# Patient Record
Sex: Male | Born: 1950 | Race: White | Hispanic: No | Marital: Married | Smoking: Current every day smoker
Health system: Southern US, Community
[De-identification: ages and names within clinical notes are randomized; demographics above are authoritative.]

## PROBLEM LIST (undated history)

## (undated) DIAGNOSIS — Z8489 Family history of other specified conditions: Secondary | ICD-10-CM

## (undated) DIAGNOSIS — T4145XA Adverse effect of unspecified anesthetic, initial encounter: Secondary | ICD-10-CM

## (undated) DIAGNOSIS — I714 Abdominal aortic aneurysm, without rupture, unspecified: Secondary | ICD-10-CM

## (undated) DIAGNOSIS — K279 Peptic ulcer, site unspecified, unspecified as acute or chronic, without hemorrhage or perforation: Secondary | ICD-10-CM

## (undated) DIAGNOSIS — J45909 Unspecified asthma, uncomplicated: Secondary | ICD-10-CM

## (undated) DIAGNOSIS — R112 Nausea with vomiting, unspecified: Secondary | ICD-10-CM

## (undated) DIAGNOSIS — T8859XA Other complications of anesthesia, initial encounter: Secondary | ICD-10-CM

## (undated) DIAGNOSIS — Z9889 Other specified postprocedural states: Secondary | ICD-10-CM

## (undated) HISTORY — DX: Abdominal aortic aneurysm, without rupture, unspecified: I71.40

## (undated) HISTORY — DX: Abdominal aortic aneurysm, without rupture: I71.4

## (undated) HISTORY — DX: Peptic ulcer, site unspecified, unspecified as acute or chronic, without hemorrhage or perforation: K27.9

---
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
## 1991-12-08 HISTORY — PX: REPAIR OF PERFORATED ULCER: SHX6065

## 1992-05-21 DIAGNOSIS — K265 Chronic or unspecified duodenal ulcer with perforation: Secondary | ICD-10-CM | POA: Insufficient documentation

## 1992-05-21 HISTORY — DX: Chronic or unspecified duodenal ulcer with perforation: K26.5

## 2014-04-23 NOTE — H&P (Signed)
  Timothy Shaffer is an 63 y.o. male.   Chief Complaint: c/o cystic masses left ring and long finger DIP joints HPI: Patient is a 63 y/o male who presents with  A 2 year history of cystic masses of the left long and ring finger DIP joints. No history of trauma to either digit. Minimal pain. He has had no medical treatment to these. He presents now for evaluation and treatment.  No past medical history on file.  No past surgical history on file.  No family history on file. Social History:  has no tobacco, alcohol, and drug history on file.  Allergies: Allergies not on file  No prescriptions prior to admission    No results found for this or any previous visit (from the past 48 hour(s)).  No results found.   Pertinent items are noted in HPI.  There were no vitals taken for this visit.  General appearance: alert Head: Normocephalic, without obvious abnormality Neck: supple, symmetrical, trachea midline Resp: clear to auscultation bilaterally Cardio: regular rate and rhythm GI: normal findings: bowel sounds normal Extremities: Examination of his hands reveals mucoid cysts of both the long and ring finger DIP joints. Neurovascularly he is intact. He has FROM of all the digits. Xray exam reveals narrowing of the joint space with squaring of the condyles of both digits Pulses: 2+ and symmetric Skin: normal Neurologic: Grossly normal    Assessment/Plan Impression: Left long and ring finger mucoid cysts of DIP joints.  Plan: To the OR for excision cysts with DIP joint debridement left long and ring fingers.The procedure, risks,benefits and post-op course were discussed with the patient at length and they were in agreement with the plan.  Marily Lente Dasnoit 04/23/2014, 5:03 PM  H&P documentation: 04/24/2014  -History and Physical Reviewed  -Patient has been re-examined  -No change in the plan of care  Cammie Sickle, MD

## 2014-04-24 ENCOUNTER — Other Ambulatory Visit: Payer: Self-pay | Admitting: Orthopedic Surgery

## 2014-04-24 ENCOUNTER — Ambulatory Visit (HOSPITAL_BASED_OUTPATIENT_CLINIC_OR_DEPARTMENT_OTHER)
Admission: RE | Admit: 2014-04-24 | Discharge: 2014-04-24 | Disposition: A | Discharge status: Home / Self Care | Payer: 59 | Source: Ambulatory Visit | Attending: Orthopedic Surgery | Admitting: Orthopedic Surgery

## 2014-04-24 ENCOUNTER — Encounter (HOSPITAL_BASED_OUTPATIENT_CLINIC_OR_DEPARTMENT_OTHER)
Admission: RE | Disposition: A | Discharge status: Home / Self Care | Payer: Self-pay | Source: Ambulatory Visit | Attending: Orthopedic Surgery

## 2014-04-24 DIAGNOSIS — D219 Benign neoplasm of connective and other soft tissue, unspecified: Secondary | ICD-10-CM | POA: Insufficient documentation

## 2014-04-24 DIAGNOSIS — L723 Sebaceous cyst: Secondary | ICD-10-CM | POA: Insufficient documentation

## 2014-04-24 DIAGNOSIS — M24049 Loose body in unspecified finger joint(s): Secondary | ICD-10-CM | POA: Insufficient documentation

## 2014-04-24 DIAGNOSIS — M19049 Primary osteoarthritis, unspecified hand: Secondary | ICD-10-CM | POA: Insufficient documentation

## 2014-04-24 HISTORY — PX: MASS EXCISION: SHX2000

## 2014-04-24 SURGERY — MINOR EXCISION OF MASS
Anesthesia: LOCAL | Site: Finger | Laterality: Left

## 2014-04-24 MED ORDER — ACETAMINOPHEN-CODEINE #3 300-30 MG PO TABS: 1.0000 | ORAL_TABLET | ORAL | Status: DC | PRN

## 2014-04-24 MED ORDER — CEPHALEXIN 500 MG PO CAPS: 500.0000 mg | ORAL_CAPSULE | Freq: Three times a day (TID) | ORAL | Status: DC

## 2014-04-24 SURGICAL SUPPLY — 39 items
BANDAGE ADH SHEER 1  50/CT (GAUZE/BANDAGES/DRESSINGS) IMPLANT
BANDAGE COBAN STERILE 2 (GAUZE/BANDAGES/DRESSINGS) IMPLANT
BLADE 15 SAFETY STRL DISP (BLADE) ×2 IMPLANT
BNDG COHESIVE 1X5 TAN STRL LF (GAUZE/BANDAGES/DRESSINGS) IMPLANT
BNDG ELASTIC 2 VLCR STRL LF (GAUZE/BANDAGES/DRESSINGS) IMPLANT
BNDG ESMARK 4X9 LF (GAUZE/BANDAGES/DRESSINGS) ×2 IMPLANT
BRUSH SCRUB EZ PLAIN DRY (MISCELLANEOUS) ×2 IMPLANT
CORDS BIPOLAR (ELECTRODE) ×2 IMPLANT
COVER MAYO STAND STRL (DRAPES) ×2 IMPLANT
CUFF TOURNIQUET SINGLE 18IN (TOURNIQUET CUFF) IMPLANT
DECANTER SPIKE VIAL GLASS SM (MISCELLANEOUS) IMPLANT
DRAIN PENROSE 1/2X12 LTX STRL (WOUND CARE) IMPLANT
DRAPE SURG 17X23 STRL (DRAPES) ×2 IMPLANT
GAUZE SPONGE 4X4 12PLY STRL (GAUZE/BANDAGES/DRESSINGS) ×2 IMPLANT
GAUZE XEROFORM 1X8 LF (GAUZE/BANDAGES/DRESSINGS) IMPLANT
GLOVE BIO SURGEON STRL SZ 6.5 (GLOVE) ×2 IMPLANT
GLOVE BIOGEL M STRL SZ7.5 (GLOVE) ×2 IMPLANT
GLOVE BIOGEL PI IND STRL 7.0 (GLOVE) ×2 IMPLANT
GLOVE BIOGEL PI INDICATOR 7.0 (GLOVE) ×2
GLOVE ORTHO TXT STRL SZ7.5 (GLOVE) ×2 IMPLANT
GOWN STRL REUS W/ TWL LRG LVL3 (GOWN DISPOSABLE) ×1 IMPLANT
GOWN STRL REUS W/ TWL XL LVL3 (GOWN DISPOSABLE) ×2 IMPLANT
GOWN STRL REUS W/TWL LRG LVL3 (GOWN DISPOSABLE) ×1
GOWN STRL REUS W/TWL XL LVL3 (GOWN DISPOSABLE) ×2
NEEDLE 27GAX1X1/2 (NEEDLE) ×2 IMPLANT
PACK BASIN DAY SURGERY FS (CUSTOM PROCEDURE TRAY) ×2 IMPLANT
PADDING CAST ABS 4INX4YD NS (CAST SUPPLIES) ×1
PADDING CAST ABS COTTON 4X4 ST (CAST SUPPLIES) ×1 IMPLANT
SPONGE GAUZE 4X4 12PLY STER LF (GAUZE/BANDAGES/DRESSINGS) ×4 IMPLANT
STOCKINETTE 4X48 STRL (DRAPES) ×2 IMPLANT
STRIP CLOSURE SKIN 1/2X4 (GAUZE/BANDAGES/DRESSINGS) IMPLANT
SUT ETHILON 5 0 P 3 18 (SUTURE) ×1
SUT NYLON ETHILON 5-0 P-3 1X18 (SUTURE) ×1 IMPLANT
SUT PROLENE 4 0 P 3 18 (SUTURE) IMPLANT
SYR 3ML 23GX1 SAFETY (SYRINGE) IMPLANT
SYR CONTROL 10ML LL (SYRINGE) ×2 IMPLANT
TOWEL OR 17X24 6PK STRL BLUE (TOWEL DISPOSABLE) ×2 IMPLANT
TRAY DSU PREP LF (CUSTOM PROCEDURE TRAY) ×2 IMPLANT
UNDERPAD 30X30 INCONTINENT (UNDERPADS AND DIAPERS) ×2 IMPLANT

## 2014-04-24 NOTE — Discharge Instructions (Signed)

## 2014-04-24 NOTE — Brief Op Note (Signed)
04/24/2014  2:36 PM  PATIENT:  Timothy Shaffer  63 y.o. male  PRE-OPERATIVE DIAGNOSIS:  mucoid cyst left long ring fingers nail grooving  POST-OPERATIVE DIAGNOSIS:  mucoid cyst left long ring fingers nail grooving  PROCEDURE:  Procedure(s): LEFT LONG & RING FINGERS DISTAL INTERPHALANGEAL JOINT/CYST EXCISION (Left)  SURGEON:  Surgeon(s) and Role:    * Cammie Sickle., MD - Primary  PHYSICIAN ASSISTANT:   ASSISTANTS: Kathyrn Sheriff.A-C   ANESTHESIA:   local  EBL:     BLOOD ADMINISTERED:none  DRAINS: none   LOCAL MEDICATIONS USED:  LIDOCAINE   SPECIMEN:  No Specimen  DISPOSITION OF SPECIMEN:  N/A  COUNTS:  YES  TOURNIQUET:    DICTATION: .Other Dictation: Dictation Number [id number]  PLAN OF CARE: Discharge to home after PACU  PATIENT DISPOSITION:  PACU - hemodynamically stable.   Delay start of Pharmacological VTE agent (>24hrs) due to surgical blood loss or risk of bleeding: not applicable

## 2014-04-24 NOTE — Op Note (Signed)
[id number] 

## 2014-04-25 NOTE — Op Note (Signed)
Timothy Shaffer, Timothy Shaffer            ACCOUNT NO.:  [id number]  MEDICAL RECORD NO.:  [id number]  LOCATION:                                 FACILITY:  PHYSICIAN:  Youlanda Mighty. Legaci Tarman, M.D.      DATE OF BIRTH:  DATE OF PROCEDURE:  04/24/2014 DATE OF DISCHARGE:                              OPERATIVE REPORT   PREOPERATIVE DIAGNOSES: 1. Large mucoid cyst, dorsal aspect of left long finger with full     length nail groove. 2. Mucoid cyst, dorsal radial aspect of left ring finger without nail     groove.  Both are associated with background osteoarthritis of     distal interphalangeal joints.  OPERATION: 1. Excision of mucoid cyst, dorsal aspect of left long finger. 2. Distal interphalangeal joint debridement with marginal osteophyte     resection, synovectomy, irrigation and removal of loose bodies,     left long finger. 3. Removal of mucoid cyst, dorsal aspect of left ring finger. 4. Distal interphalangeal joint debridement with removal of loose     bodies and synovectomy, left ring finger.  OPERATING SURGEON:  Youlanda Mighty. Giovani Neumeister, M.D.  ASSISTANT:  Marily Lente. Dasnoit, PA-C  ANESTHESIA:  2% lidocaine metacarpal head level blocks of left long and ring fingers, total volume of 2% lidocaine with 7 mL.  This was performed as a minor operating room procedure under straight local.  INDICATIONS:  Timothy Shaffer is a 63 year old gentleman who is referred for evaluation and management of nail deformity and large mucoid cyst on the dorsal aspect of his left long and ring fingers. These have been present for 1 year.  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  X-rays of his fingers revealed marginal osteophytes, joint space narrowing.  No obvious loose bodies.  We advised him to consider DIP joint debridement, synovectomy, irrigation and removal of the mucoid cyst.  Typically, nail fold decompression will allow correction of the nail grooving.  Timothy Shaffer was  carefully advised that we could not alter the natural history of his osteoarthritis with the surgery.  We can, however, allow his nails to correct by decompressing the mucoid cysts.  We can also relieve the pain from the mucoid cyst by excision.  He understands he could develop future mucoid cyst, and clearly will develop progression of his osteoarthritis over time.  Detailed informed consent was provided in the office and in the holding area.  DESCRIPTION OF PROCEDURE:  Timothy Shaffer was identified in the holding area.  His proper surgical site of the left long and ring fingers identified per protocol with a marking pen.  He was then transferred to room 2 of the [facility], where he laid upon the operating table in supine position.  His left arm was placed on a hand table and after alcohol Betadine prep, 2% lidocaine was infiltrated in the metacarpal head level to obtain a digital block of the left long and ring fingers.  After 5 minutes, excellent anesthesia of both digits was achieved.  The left hand and arm were then prepped with Betadine soap and solution, sterilely draped.  A pneumatic tourniquet was applied for p.r.n. use in the proximal brachium.  However, plan was to exsanguinate the fingers and use as marked strips at the base of the proximal phalanges as digital tourniquet.  After routine surgical time-out, we exsanguinated the left long finger with a gauze wrap and placed a 3/4 inch wide strip of Esmarch over the proximal phalangeal segment as digital tourniquet.  Procedure commenced with curvilinear incision incorporating the cyst distally.  A very large nearly 1 cubic cc size mucoid cyst was identified and circumferentially dissected.  This was emptied of its contents and its neck excised with a small rongeur.  We then performed a dorsal radial arthrotomy of DIP joint by resecting the capsule between the terminal extensor tendon slip and the  radial collateral ligament.  There are marginal osteophytes at base of the distal phalanx, particularly deep to the radial collateral ligament. These were excised with a micro rongeur and a hose micro curette.  We then used a blunt dental needle to irrigate the joint copiously with sterile saline.  After inspecting for other pathologies none were identified.  The wound was repaired with intradermal 4-0 Prolene.  Xeroflo was applied, followed by a sterile gauze and a Coban finger dressing.  The tourniquet was released from the long finger, followed by exsanguination of the ring finger with a similar gauze wrap and placement of 3 quarter-inch wide strip of Esmarch over the proximal phalangeal segment as a digital tourniquet.  Procedure commenced with a curvilinear incision incorporating the cyst distally.  Subcutaneous tissues were carefully divided taking care to allow circumferential dissection of the cyst.  This was excised with a rongeur followed by arthrotomy in the dorsal radial aspect of the DIP joint.  The capsules were excised between the terminal extensor tendon slip and the radial collateral ligament.  Marginal osteophytes at the base of the distal phalanx and along the dorsum of the middle phalanx were excised with a micro rongeur followed by synovectomy with removal of loose bodies and irrigation with the blunt dental needle.  Hemostasis was achieved by direct compression.  The wound was then repaired with intradermal 4-0 Prolene suture and application of Xeroflo. Sterile gauze applied followed by a Coban finger dressing. The dressings were completed with eye tape.  There were otherwise no apparent complications.  Timothy Shaffer is provided a prescription for Tylenol with Codeine #3, 1 tablet p.o. q.4-6 hours p.r.n. pain.  Also, 30 tablets without refill, also Keflex 500 mg 1 p.o. q.8 hours x4 days a prophylactic antibiotic.     Youlanda Mighty Kile Kabler, M.D.     RVS/MEDQ   D:  04/24/2014  T:  04/25/2014  Job:  [id number]

## 2014-04-26 ENCOUNTER — Encounter (HOSPITAL_BASED_OUTPATIENT_CLINIC_OR_DEPARTMENT_OTHER): Payer: Self-pay | Admitting: Orthopedic Surgery

## 2020-01-24 DIAGNOSIS — Z1211 Encounter for screening for malignant neoplasm of colon: Secondary | ICD-10-CM | POA: Diagnosis not present

## 2020-01-24 DIAGNOSIS — Z720 Tobacco use: Secondary | ICD-10-CM | POA: Diagnosis not present

## 2020-01-24 DIAGNOSIS — Z125 Encounter for screening for malignant neoplasm of prostate: Secondary | ICD-10-CM | POA: Diagnosis not present

## 2020-01-24 DIAGNOSIS — Z136 Encounter for screening for cardiovascular disorders: Secondary | ICD-10-CM | POA: Diagnosis not present

## 2020-01-24 DIAGNOSIS — Z0000 Encounter for general adult medical examination without abnormal findings: Secondary | ICD-10-CM | POA: Diagnosis not present

## 2020-01-24 DIAGNOSIS — R718 Other abnormality of red blood cells: Secondary | ICD-10-CM | POA: Diagnosis not present

## 2020-03-04 DIAGNOSIS — Z1211 Encounter for screening for malignant neoplasm of colon: Secondary | ICD-10-CM | POA: Diagnosis not present

## 2020-03-29 DIAGNOSIS — R195 Other fecal abnormalities: Secondary | ICD-10-CM | POA: Diagnosis not present

## 2020-04-06 HISTORY — PX: COLONOSCOPY: SHX174

## 2020-04-08 DIAGNOSIS — Z0100 Encounter for examination of eyes and vision without abnormal findings: Secondary | ICD-10-CM | POA: Diagnosis not present

## 2020-04-08 DIAGNOSIS — H524 Presbyopia: Secondary | ICD-10-CM | POA: Diagnosis not present

## 2020-04-22 DIAGNOSIS — Z1159 Encounter for screening for other viral diseases: Secondary | ICD-10-CM | POA: Diagnosis not present

## 2020-04-25 DIAGNOSIS — D123 Benign neoplasm of transverse colon: Secondary | ICD-10-CM | POA: Diagnosis not present

## 2020-04-25 DIAGNOSIS — K640 First degree hemorrhoids: Secondary | ICD-10-CM | POA: Diagnosis not present

## 2020-04-25 DIAGNOSIS — D128 Benign neoplasm of rectum: Secondary | ICD-10-CM | POA: Diagnosis not present

## 2020-04-25 DIAGNOSIS — D127 Benign neoplasm of rectosigmoid junction: Secondary | ICD-10-CM | POA: Diagnosis not present

## 2020-04-25 DIAGNOSIS — D124 Benign neoplasm of descending colon: Secondary | ICD-10-CM | POA: Diagnosis not present

## 2020-04-25 DIAGNOSIS — R195 Other fecal abnormalities: Secondary | ICD-10-CM | POA: Diagnosis not present

## 2020-04-25 DIAGNOSIS — D125 Benign neoplasm of sigmoid colon: Secondary | ICD-10-CM | POA: Diagnosis not present

## 2020-04-30 DIAGNOSIS — D128 Benign neoplasm of rectum: Secondary | ICD-10-CM | POA: Diagnosis not present

## 2020-04-30 DIAGNOSIS — D123 Benign neoplasm of transverse colon: Secondary | ICD-10-CM | POA: Diagnosis not present

## 2020-04-30 DIAGNOSIS — D124 Benign neoplasm of descending colon: Secondary | ICD-10-CM | POA: Diagnosis not present

## 2020-04-30 DIAGNOSIS — D125 Benign neoplasm of sigmoid colon: Secondary | ICD-10-CM | POA: Diagnosis not present

## 2020-04-30 DIAGNOSIS — D127 Benign neoplasm of rectosigmoid junction: Secondary | ICD-10-CM | POA: Diagnosis not present

## 2020-05-21 ENCOUNTER — Encounter: Payer: Self-pay | Admitting: Surgery

## 2020-05-21 ENCOUNTER — Ambulatory Visit: Payer: Self-pay | Admitting: Surgery

## 2020-05-21 DIAGNOSIS — K265 Chronic or unspecified duodenal ulcer with perforation: Secondary | ICD-10-CM

## 2020-05-21 DIAGNOSIS — D120 Benign neoplasm of cecum: Secondary | ICD-10-CM | POA: Insufficient documentation

## 2020-05-21 DIAGNOSIS — Z8719 Personal history of other diseases of the digestive system: Secondary | ICD-10-CM | POA: Diagnosis not present

## 2020-05-21 DIAGNOSIS — Z720 Tobacco use: Secondary | ICD-10-CM

## 2020-05-21 DIAGNOSIS — K279 Peptic ulcer, site unspecified, unspecified as acute or chronic, without hemorrhage or perforation: Secondary | ICD-10-CM

## 2020-05-21 DIAGNOSIS — Z789 Other specified health status: Secondary | ICD-10-CM

## 2020-05-21 DIAGNOSIS — Z86010 Personal history of colonic polyps: Secondary | ICD-10-CM | POA: Insufficient documentation

## 2020-05-21 DIAGNOSIS — Z860101 Personal history of adenomatous and serrated colon polyps: Secondary | ICD-10-CM

## 2020-05-21 DIAGNOSIS — K635 Polyp of colon: Secondary | ICD-10-CM | POA: Diagnosis not present

## 2020-05-21 NOTE — H&P (Signed)
Toniann Ket Appointment: 05/21/2020 10:00 AM Location: [place] Office Patient #: [id number] DOB: July 18, 1951 Married / Language: Cleophus Molt / Race: White Male  History of Present Illness Adin Hector MD; 05/21/2020 10:56 AM) The patient is a 69 year old male who presents with a colonic polyp. Note for "Colonic polyp": ` ` ` Patient sent for surgical consultation at the request of Dr Michail Sermon  Chief Complaint: large cecal polypoid mass not amenable to endoscopic resection. ` ` The patient is a active smoking gentleman who is followed by Dr. Sheryn Bison. Had declined colonoscopy offerred in 2009 age 50 in the past. Had a positive colon Fit test. Sent for endoscopy. Numerous colon polyps found. Small ones and rectosigmoid region removed. Large bulky mass in cecum biopsied. Felt not to be amenable to endoscopic resection. Pathology consistent with adenomatous polyp. Surgical consultation recommended by Dr. Michail Sermon to consider segmental resection.  Patient comes in today with his wife. He is a moderate smoker. Sounds like he is a moderate drinker as well. Seems like he tends to avoid doctors. He did have emergency surgery for what sounds like a perforated duodenal ulcer in the 1990s when they lived up in [place] at [facility]. He is not on any antacid medication. He takes ibuprofen sparingly. Moves his bowels twice a day. No hematochezia or melena. He claims he can walk a half hour without difficulty. No history of heart attack or stroke. No diabetes. He is not on any proton pump inhibitors.  No personal nor family history of GI/colon cancer, inflammatory bowel disease, irritable bowel syndrome, allergy such as Celiac Sprue, dietary/dairy problems, colitis, ulcers nor gastritis. No recent sick contacts/gastroenteritis. No travel outside the country. No changes in diet. No dysphagia to solids or liquids. No significant heartburn or reflux. No melena, hematemesis, coffee ground  emesis. No evidence of prior gastric/peptic ulceration.  (Review of systems as stated in this history (HPI) or in the review of systems. Otherwise all other 12 point ROS are negative) ` ` `  This patient encounter took 35 minutes today to perform the following: obtain history, perform exam, review outside records, interpret tests & imaging, counsel the patient on their diagnosis; and, document this encounter, including findings & plan in the electronic health record (EHR).   Past Surgical History Illene Regulus, CMA; 05/21/2020 10:17 AM) Resection of Small Bowel Tonsillectomy  Allergies (Alisha Spillers, CMA; 05/21/2020 10:18 AM) No Known Drug Allergies [05/21/2020]:  Medication History (Alisha Spillers, CMA; 05/21/2020 10:18 AM) No Current Medications Medications Reconciled  Social History Lars Mage Spillers, CMA; 05/21/2020 10:17 AM) Alcohol use Moderate alcohol use. Caffeine use Coffee. Illicit drug use Remotely quit drug use. Tobacco use Current every day smoker.  Family History Illene Regulus, CMA; 05/21/2020 10:17 AM) Breast Cancer Daughter.  Other Problems Lars Mage Spillers, CMA; 05/21/2020 10:17 AM) Gastric Ulcer     Review of Systems (Alisha Spillers CMA; 05/21/2020 10:17 AM) General Not Present- Appetite Loss, Chills, Fatigue, Fever, Night Sweats, Weight Gain and Weight Loss. Skin Present- Non-Healing Wounds. Not Present- Change in Wart/Mole, Dryness, Hives, Jaundice, New Lesions, Rash and Ulcer. HEENT Present- Wears glasses/contact lenses. Not Present- Earache, Hearing Loss, Hoarseness, Nose Bleed, Oral Ulcers, Ringing in the Ears, Seasonal Allergies, Sinus Pain, Sore Throat, Visual Disturbances and Yellow Eyes. Respiratory Not Present- Bloody sputum, Chronic Cough, Difficulty Breathing, Snoring and Wheezing. Breast Not Present- Breast Mass, Breast Pain, Nipple Discharge and Skin Changes. Cardiovascular Not Present- Chest Pain, Difficulty Breathing Lying  Down, Leg Cramps, Palpitations, Rapid Heart Rate, Shortness of Breath  and Swelling of Extremities. Gastrointestinal Not Present- Abdominal Pain, Bloating, Bloody Stool, Change in Bowel Habits, Chronic diarrhea, Constipation, Difficulty Swallowing, Excessive gas, Gets full quickly at meals, Hemorrhoids, Indigestion, Nausea, Rectal Pain and Vomiting. Male Genitourinary Not Present- Blood in Urine, Change in Urinary Stream, Frequency, Impotence, Nocturia, Painful Urination, Urgency and Urine Leakage.  Vitals (Alisha Spillers CMA; 05/21/2020 10:18 AM) 05/21/2020 10:17 AM Weight: 126 lb Height: 71in Body Surface Area: 1.73 m Body Mass Index: 17.57 kg/m  Temp.: 98.8F(Oral)  Pulse: 94 (Regular)  BP: 142/82(Sitting, Left Arm, Standard)        Physical Exam Adin Hector MD; 05/21/2020 10:55 AM)  General Mental Status-Alert. General Appearance-Not in acute distress, Not Sickly. Orientation-Oriented X3. Hydration-Well hydrated. Voice-Normal. Note: thin male. Not quite cachectic. Moves slightly slowly but steadily.  Integumentary Global Assessment Upon inspection and palpation of skin surfaces of the - Axillae: non-tender, no inflammation or ulceration, no drainage. and Distribution of scalp and body hair is normal. General Characteristics Temperature - normal warmth is noted.  Head and Neck Head-normocephalic, atraumatic with no lesions or palpable masses. Face Global Assessment - atraumatic, no absence of expression. Neck Global Assessment - no abnormal movements, no bruit auscultated on the right, no bruit auscultated on the left, no decreased range of motion, non-tender. Trachea-midline. Thyroid Gland Characteristics - non-tender.  Eye Eyeball - Left-Extraocular movements intact, No Nystagmus - Left. Eyeball - Right-Extraocular movements intact, No Nystagmus - Right. Cornea - Left-No Hazy - Left. Cornea - Right-No Hazy -  Right. Sclera/Conjunctiva - Left-No scleral icterus, No Discharge - Left. Sclera/Conjunctiva - Right-No scleral icterus, No Discharge - Right. Pupil - Left-Direct reaction to light normal. Pupil - Right-Direct reaction to light normal.  ENMT Ears Pinna - Left - no drainage observed, no generalized tenderness observed. Pinna - Right - no drainage observed, no generalized tenderness observed. Nose and Sinuses External Inspection of the Nose - no destructive lesion observed. Inspection of the nares - Left - quiet respiration. Inspection of the nares - Right - quiet respiration. Mouth and Throat Lips - Upper Lip - no fissures observed, no pallor noted. Lower Lip - no fissures observed, no pallor noted. Nasopharynx - no discharge present. Oral Cavity/Oropharynx - Tongue - no dryness observed. Oral Mucosa - no cyanosis observed. Hypopharynx - no evidence of airway distress observed.  Chest and Lung Exam Inspection Movements - Normal and Symmetrical. Accessory muscles - No use of accessory muscles in breathing. Palpation Palpation of the chest reveals - Non-tender. Auscultation Breath sounds - Normal and Clear.  Cardiovascular Auscultation Rhythm - Regular. Murmurs & Other Heart Sounds - Auscultation of the heart reveals - No Murmurs and No Systolic Clicks.  Abdomen Inspection Inspection of the abdomen reveals - No Visible peristalsis and No Abnormal pulsations. Umbilicus - No Bleeding, No Urine drainage. Palpation/Percussion Palpation and Percussion of the abdomen reveal - Soft, Non Tender, No Rebound tenderness, No Rigidity (guarding) and No Cutaneous hyperesthesia. Note: Abdomen Flat and somewhat firm at first but then softens up. Upper midline incision. Very thin abdominal wall. Can feel some stitches and the subcutaneous tissues tissue. No obvious hernia. Abdomen is nontender. Not distended. No umbilical or incisional hernias. No guarding.  Male Genitourinary Sexual  Maturity Tanner 5 - Adult hair pattern and Adult penile size and shape. Note: No inguinal hernias. Normal external genitalia. Epididymi, testes, and spermatic cords normal without any masses.  Rectal Note: deferred given recent colonoscopy. Apparently mild hemorrhoids noted.  Peripheral Vascular Upper Extremity Inspection - Left -  No Cyanotic nailbeds - Left, Not Ischemic. Inspection - Right - No Cyanotic nailbeds - Right, Not Ischemic.  Neurologic Neurologic evaluation reveals -normal attention span and ability to concentrate, able to name objects and repeat phrases. Appropriate fund of knowledge , normal sensation and normal coordination. Mental Status Affect - not angry, not paranoid. Cranial Nerves-Normal Bilaterally. Gait-Normal.  Neuropsychiatric Mental status exam performed with findings of-able to articulate well with normal speech/language, rate, volume and coherence, thought content normal with ability to perform basic computations and apply abstract reasoning and no evidence of hallucinations, delusions, obsessions or homicidal/suicidal ideation.  Musculoskeletal Global Assessment Spine, Ribs and Pelvis - no instability, subluxation or laxity. Right Upper Extremity - no instability, subluxation or laxity.  Lymphatic Head & Neck  General Head & Neck Lymphatics: Bilateral - Description - No Localized lymphadenopathy. Axillary  General Axillary Region: Bilateral - Description - No Localized lymphadenopathy. Femoral & Inguinal  Generalized Femoral & Inguinal Lymphatics: Left - Description - No Localized lymphadenopathy. Right - Description - No Localized lymphadenopathy.    Assessment & Plan Adin Hector MD; 05/21/2020 10:49 AM)  POLYP OF CECUM (K63.5) Impression: large bulky polyp of cecum not amenable to endoscopic resection. Adenomatous.  He would benefit from segmental resection. Proximal right ileocolectomy. Intracorporeal anastomosis. Reasonable  for robotic approach. He has had prior major upper abdominal surgery for perforated ulcer decades ago. No other surgeries. That may increase OR time with lysis of adhesions but hopefully likelihood of conversion is low.  He is interested in proceeding. His wife agrees.  I strongly recommend he back off on alcohol use and quit smoking to minimize perioperative issues and complications. He will consider it.   PREOP COLON - ENCOUNTER FOR PREOPERATIVE EXAMINATION FOR GENERAL SURGICAL PROCEDURE (Z01.818)  Current Plans You are being scheduled for surgery- Our schedulers will call you.  You should hear from our office's scheduling department within 5 working days about the location, date, and time of surgery. We try to make accommodations for patient's preferences in scheduling surgery, but sometimes the OR schedule or the surgeon's schedule prevents [place] from making those accommodations.  If you have not heard from our office [phone]) in 5 working days, call the office and ask for your surgeon's nurse.  If you have other questions about your diagnosis, plan, or surgery, call the office and ask for your surgeon's nurse.  Written instructions provided The anatomy & physiology of the digestive tract was discussed. The pathophysiology of the colon was discussed. Natural history risks without surgery was discussed. I feel the risks of no intervention will lead to serious problems that outweigh the operative risks; therefore, I recommended a partial colectomy to remove the pathology. Minimally invasive (Robotic/Laparoscopic) & open techniques were discussed.  Risks such as bleeding, infection, abscess, leak, reoperation, possible ostomy, hernia, heart attack, death, and other risks were discussed. I noted a good likelihood this will help address the problem. Goals of post-operative recovery were discussed as well. Need for adequate nutrition, daily bowel regimen and healthy physical  activity, to optimize recovery was noted as well. We will work to minimize complications. Educational materials were available as well. Questions were answered. The patient expresses understanding & wishes to proceed with surgery.  Pt Education - [facility] Colon Bowel Prep 2018 ERAS/Miralax/Antibiotics Started Neomycin Sulfate 500 MG Oral Tablet, 2 (two) Tablet SEE NOTE, #6, 05/21/2020, No Refill. Local Order: Pharmacist Notes: TAKE TWO TABLETS AT 2 PM, 3 PM, AND 10 PM THE DAY PRIOR TO SURGERY Started  Flagyl 500 MG Oral Tablet, 2 (two) Tablet SEE NOTE, #6, 05/21/2020, No Refill. Local Order: Pharmacist Notes: Take at 2pm, 3pm, and 10pm the day prior to your colon operation Pt Education - Pamphlet Given - Laparoscopic Colorectal Surgery: discussed with patient and provided information. Pt Education - [facility] Colectomy post-op instructions: discussed with patient and provided information.  TOBACCO ABUSE (Z72.0)  STOP SMOKING! We talked to the patient about the dangers of smoking.  We stressed that tobacco use dramatically increases the risk of peri-operative complications such as infection, tissue necrosis leaving to problems with incision/wound and organ healing, hernia, chronic pain, heart attack, stroke, DVT, pulmonary embolism, and death.  We noted there are programs in our community to help stop smoking.  Information was available.   Current Plans Pt Education - [facility] STOP SMOKING!  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 [address], [place], [place] [address] [phone] Fax [phone] Main/Paging  CONTACT INFORMATION: Weekday (9AM-5PM) concerns: Call [facility] main office at [phone] Weeknight (5PM-9AM) or Weekend/Holiday concerns: Check [email or web address] for General Surgery [facility] coverage (Please, do not use SecureChat as it  is not reliable communication to operating surgeons for immediate patient care)

## 2020-07-05 NOTE — Patient Instructions (Addendum)
DUE TO COVID-19 ONLY ONE VISITOR IS ALLOWED TO COME WITH YOU AND STAY IN THE WAITING ROOM ONLY DURING PRE OP AND PROCEDURE DAY OF SURGERY. THE 2 VISITORS MAY VISIT WITH YOU AFTER SURGERY IN YOUR PRIVATE ROOM DURING VISITING HOURS ONLY!  YOU NEED TO HAVE A COVID 19 TEST ON__8/7_____ @_10 :00______, THIS TEST MUST BE DONE BEFORE SURGERY, COVID TESTING SITE [address] [place] [address], IT IS ON THE RIGHT GOING OUT [address] APPROXITAMELTELY 2 MINUTES PAST [employer] ON THE RIGHT. ONCE YOUR COVID TEST IS COMPLETED,  PLEASE BEGIN THE QUARANTINE INSTRUCTIONS AS OUTLINED IN YOUR HANDOUT.                Jaziah Goeller    Your procedure is scheduled on: 07/17/20   Report to [facility] Main  Entrance   Report to admitting at  12:00 PM .     Call this number if you have problems the morning of surgery [phone]   Follow bowel prep instructions from Dr. Johney [place]' office.  Drink plenty of fluids the day of prep to prevent dehydration.  Marland Kitchen BRUSH YOUR TEETH MORNING OF SURGERY AND RINSE YOUR MOUTH OUT, NO CHEWING GUM CANDY OR MINTS.  DRINK 2 PRESURGERY ENSURE DRINKS THE NIGHT BEFORE SURGERY AT 10:00 PM    NO SOLIDS AFTER MIDNIGHT THE DAY PRIOR TO THE SURGERY.   NOTHING BY MOUTH EXCEPT CLEAR LIQUIDS UNTIL 11:00 AM  . PLEASE FINISH PRESURGERY ENSURE DRINK at 11:00 AM     Take these medicines the morning of surgery with A SIP OF WATER: Use your inhaler and bring it with you to the hospital                                 You may not have any metal on your body including              piercings  Do not wear jewelry, lotions, powders or deodorant                      Men may shave face and neck.   Do not bring valuables to the hospital. [place].  Contacts, dentures or bridgework may not be worn into surgery.                [place] - Preparing for Surgery  Before surgery, you can play an  important role.   Because skin is not sterile, your skin needs to be as free of germs as possible.   You can reduce the number of germs on your skin by washing with CHG (chlorahexidine gluconate) soap before surgery.   CHG is an antiseptic cleaner which kills germs and bonds with the skin to continue killing germs even after washing. Please DO NOT use if you have an allergy to CHG or antibacterial soaps.   If your skin becomes reddened/irritated stop using the CHG and inform your nurse when you arrive at Short Stay. .  You may shave your face/neck.  Please follow these instructions carefully:  1.  Shower with CHG Soap the night before surgery and the  morning of Surgery.  2.  If you choose to wash your hair, wash your hair first as usual with your  normal  shampoo.  3.  After you shampoo, rinse your hair and body thoroughly to remove the  shampoo.                                        4.  Use CHG as you would any other liquid soap.  You can apply chg directly  to the skin and wash                       Gently with a scrungie or clean washcloth.  5.  Apply the CHG Soap to your body ONLY FROM THE NECK DOWN.   Do not use on face/ open                           Wound or open sores. Avoid contact with eyes, ears mouth and genitals (private parts).                       Wash face,  Genitals (private parts) with your normal soap.             6.  Wash thoroughly, paying special attention to the area where your surgery  will be performed.  7.  Thoroughly rinse your body with warm water from the neck down.  8.  DO NOT shower/wash with your normal soap after using and rinsing off  the CHG Soap.             9.  Pat yourself dry with a clean towel.            10.  Wear clean pajamas.            11.  Place clean sheets on your bed the night of your first shower and do not  sleep with pets. Day of Surgery : Do not apply any lotions/deodorants the morning of surgery.  Please wear clean clothes to the  hospital/surgery center.  FAILURE TO FOLLOW THESE INSTRUCTIONS MAY RESULT IN THE CANCELLATION OF YOUR SURGERY PATIENT SIGNATURE_________________________________  NURSE SIGNATURE__________________________________  ________________________________________________________________________   Adam Phenix  An incentive spirometer is a tool that can help keep your lungs clear and active. This tool measures how well you are filling your lungs with each breath. Taking long deep breaths may help reverse or decrease the chance of developing breathing (pulmonary) problems (especially infection) following:  A long period of time when you are unable to move or be active. BEFORE THE PROCEDURE   If the spirometer includes an indicator to show your best effort, your nurse or respiratory therapist will set it to a desired goal.  If possible, sit up straight or lean slightly forward. Try not to slouch.  Hold the incentive spirometer in an upright position. INSTRUCTIONS FOR USE  1. Sit on the edge of your bed if possible, or sit up as far as you can in bed or on a chair. 2. Hold the incentive spirometer in an upright position. 3. Breathe out normally. 4. Place the mouthpiece in your mouth and seal your lips tightly around it. 5. Breathe in slowly and as deeply as possible, raising the piston or the ball toward the top of the column. 6. Hold your breath for 3-5 seconds or for as long as possible. Allow the piston or ball to fall to the bottom of the column. 7. Remove the mouthpiece from  your mouth and breathe out normally. 8. Rest for a few seconds and repeat Steps 1 through 7 at least 10 times every 1-2 hours when you are awake. Take your time and take a few normal breaths between deep breaths. 9. The spirometer may include an indicator to show your best effort. Use the indicator as a goal to work toward during each repetition. 10. After each set of 10 deep breaths, practice coughing to be sure  your lungs are clear. If you have an incision (the cut made at the time of surgery), support your incision when coughing by placing a pillow or rolled up towels firmly against it. Once you are able to get out of bed, walk around indoors and cough well. You may stop using the incentive spirometer when instructed by your caregiver.  RISKS AND COMPLICATIONS  Take your time so you do not get dizzy or light-headed.  If you are in pain, you may need to take or ask for pain medication before doing incentive spirometry. It is harder to take a deep breath if you are having pain. AFTER USE  Rest and breathe slowly and easily.  It can be helpful to keep track of a log of your progress. Your caregiver can provide you with a simple table to help with this. If you are using the spirometer at home, follow these instructions: [place] IF:   You are having difficultly using the spirometer.  You have trouble using the spirometer as often as instructed.  Your pain medication is not giving enough relief while using the spirometer.  You develop fever of 100.5 F (38.1 C) or higher. SEEK IMMEDIATE MEDICAL CARE IF:   You cough up bloody sputum that had not been present before.  You develop fever of 102 F (38.9 C) or greater.  You develop worsening pain at or near the incision site. MAKE SURE YOU:   Understand these instructions.  Will watch your condition.  Will get help right away if you are not doing well or get worse. Document Released: 04/05/2007 Document Revised: 02/15/2012 Document Reviewed: 06/06/2007 [facility] Patient Information 2014 [place], [place].   ________________________________________________________________________

## 2020-07-08 ENCOUNTER — Other Ambulatory Visit: Payer: Self-pay

## 2020-07-08 ENCOUNTER — Encounter (HOSPITAL_COMMUNITY): Payer: Self-pay

## 2020-07-08 ENCOUNTER — Encounter (HOSPITAL_COMMUNITY)
Admission: RE | Admit: 2020-07-08 | Discharge: 2020-07-08 | Disposition: A | Discharge status: Home / Self Care | Payer: Medicare HMO | Source: Ambulatory Visit | Attending: Surgery | Admitting: Surgery

## 2020-07-08 DIAGNOSIS — Z01812 Encounter for preprocedural laboratory examination: Secondary | ICD-10-CM | POA: Insufficient documentation

## 2020-07-08 HISTORY — DX: Other complications of anesthesia, initial encounter: T88.59XA

## 2020-07-08 HISTORY — DX: Unspecified asthma, uncomplicated: J45.909

## 2020-07-08 HISTORY — DX: Other specified postprocedural states: Z98.890

## 2020-07-08 HISTORY — DX: Family history of other specified conditions: Z84.89

## 2020-07-08 LAB — CBC
HCT: 43.9 % (ref 39.0–52.0)
Hemoglobin: 14.6 g/dL (ref 13.0–17.0)
MCH: 35.9 pg — ABNORMAL HIGH (ref 26.0–34.0)
MCHC: 33.3 g/dL (ref 30.0–36.0)
MCV: 107.9 fL — ABNORMAL HIGH (ref 80.0–100.0)
Platelets: 218 10*3/uL (ref 150–400)
RBC: 4.07 MIL/uL — ABNORMAL LOW (ref 4.22–5.81)
RDW: 12.2 % (ref 11.5–15.5)
WBC: 7.4 10*3/uL (ref 4.0–10.5)
nRBC: 0 % (ref 0.0–0.2)

## 2020-07-08 LAB — HEMOGLOBIN A1C
Hgb A1c MFr Bld: 5 % (ref 4.8–5.6)
Mean Plasma Glucose: 96.8 mg/dL

## 2020-07-08 NOTE — Progress Notes (Signed)
COVID Vaccine Completed:Yes Date COVID Vaccine completed:04/03/20 COVID vaccine manufacturer: Pfizer     PCP - Dr. Charlynne Cousins Cardiologist - no  Chest x-ray - no EKG - no Stress Test - no ECHO - no Cardiac Cath - no  Sleep Study - no CPAP -   Fasting Blood Sugar - NA Checks Blood Sugar _____ times a day  Blood Thinner Instructions:NA Aspirin Instructions: Last Dose:  Anesthesia review:   Patient denies shortness of breath, fever, cough and chest pain at PAT appointment yes   Patient verbalized understanding of instructions that were given to them at the PAT appointment. Patient was also instructed that they will need to review over the PAT instructions again at home before surgery. 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 He has no SOB with ADLs.

## 2020-07-08 NOTE — Consult Note (Signed)
[place] Nurse requested for preoperative stoma site marking  Discussed surgical procedure and stoma creation with patient.  Explained role of the [place] nurse team. Provided the patient with educational booklet and provided samples of pouching options.  Answered patient's questions.   Examined patient sitting, and standing in order to place the marking in the patient's visual field, away from any creases or abdominal contour issues and within the rectus muscle.  Attempted to mark below the patient's belt line. There is a crease of loose skin which occurs lower on the abdomen when the patient sits forward and should be avoided if possible.   Marked for colostomy in the LLQ  _2.5___inches  to the left of the umbilicus and _5__WPVXYI below the umbilicus.  Marked for ileostomy in the RLQ  __2.5_inches to the right of the umbilicus and  __0__ inches below the umbilicus.  Patient's abdomen cleansed with CHG wipes at site markings, allowed to air dry prior to marking.Covered mark with thin film transparent dressing to preserve mark until date of surgery. Provided patient with marking pen and instructed to re-color in the area if it begins to fade before surgery occurs.   [place] Nurse team will follow up with patient after surgery for continued ostomy care and teaching if they receive an ostomy during surgery.  Julien Girt MSN, RN, [place], [place], [place]

## 2020-07-13 ENCOUNTER — Other Ambulatory Visit (HOSPITAL_COMMUNITY)
Admission: RE | Admit: 2020-07-13 | Discharge: 2020-07-13 | Disposition: A | Discharge status: Home / Self Care | Payer: Medicare HMO | Source: Ambulatory Visit | Attending: Surgery | Admitting: Surgery

## 2020-07-13 DIAGNOSIS — Z01812 Encounter for preprocedural laboratory examination: Secondary | ICD-10-CM | POA: Diagnosis not present

## 2020-07-13 DIAGNOSIS — Z20822 Contact with and (suspected) exposure to covid-19: Secondary | ICD-10-CM | POA: Diagnosis not present

## 2020-07-13 LAB — SARS CORONAVIRUS 2 (TAT 6-24 HRS): SARS Coronavirus 2: NEGATIVE

## 2020-07-16 MED ORDER — BUPIVACAINE LIPOSOME 1.3 % IJ SUSP
20.0000 mL | Freq: Once | INTRAMUSCULAR | Status: DC
  Filled 2020-07-16: qty 20

## 2020-07-17 ENCOUNTER — Encounter (HOSPITAL_COMMUNITY): Payer: Self-pay | Admitting: Surgery

## 2020-07-17 ENCOUNTER — Encounter (HOSPITAL_COMMUNITY)
Admission: RE | Disposition: A | Discharge status: Home / Self Care | Payer: Self-pay | Source: Home / Self Care | Attending: Surgery

## 2020-07-17 ENCOUNTER — Inpatient Hospital Stay (HOSPITAL_COMMUNITY): Payer: Medicare HMO | Admitting: Certified Registered"

## 2020-07-17 ENCOUNTER — Inpatient Hospital Stay (HOSPITAL_COMMUNITY)
Admission: RE | Admit: 2020-07-17 | Discharge: 2020-07-24 | DRG: 329 | Disposition: A | Discharge status: Home / Self Care | Payer: Medicare HMO | Attending: Surgery | Admitting: Surgery

## 2020-07-17 DIAGNOSIS — Z886 Allergy status to analgesic agent status: Secondary | ICD-10-CM

## 2020-07-17 DIAGNOSIS — D638 Anemia in other chronic diseases classified elsewhere: Secondary | ICD-10-CM | POA: Diagnosis present

## 2020-07-17 DIAGNOSIS — D120 Benign neoplasm of cecum: Secondary | ICD-10-CM | POA: Diagnosis not present

## 2020-07-17 DIAGNOSIS — E440 Moderate protein-calorie malnutrition: Secondary | ICD-10-CM | POA: Diagnosis present

## 2020-07-17 DIAGNOSIS — J9600 Acute respiratory failure, unspecified whether with hypoxia or hypercapnia: Secondary | ICD-10-CM | POA: Diagnosis not present

## 2020-07-17 DIAGNOSIS — R7981 Abnormal blood-gas level: Secondary | ICD-10-CM

## 2020-07-17 DIAGNOSIS — R918 Other nonspecific abnormal finding of lung field: Secondary | ICD-10-CM | POA: Diagnosis not present

## 2020-07-17 DIAGNOSIS — K567 Ileus, unspecified: Secondary | ICD-10-CM | POA: Diagnosis not present

## 2020-07-17 DIAGNOSIS — R578 Other shock: Secondary | ICD-10-CM | POA: Diagnosis not present

## 2020-07-17 DIAGNOSIS — F17210 Nicotine dependence, cigarettes, uncomplicated: Secondary | ICD-10-CM | POA: Diagnosis present

## 2020-07-17 DIAGNOSIS — M47816 Spondylosis without myelopathy or radiculopathy, lumbar region: Secondary | ICD-10-CM | POA: Diagnosis not present

## 2020-07-17 DIAGNOSIS — N179 Acute kidney failure, unspecified: Secondary | ICD-10-CM | POA: Diagnosis not present

## 2020-07-17 DIAGNOSIS — Z7289 Other problems related to lifestyle: Secondary | ICD-10-CM

## 2020-07-17 DIAGNOSIS — T17890A Other foreign object in other parts of respiratory tract causing asphyxiation, initial encounter: Secondary | ICD-10-CM | POA: Diagnosis not present

## 2020-07-17 DIAGNOSIS — J9811 Atelectasis: Secondary | ICD-10-CM | POA: Diagnosis not present

## 2020-07-17 DIAGNOSIS — K279 Peptic ulcer, site unspecified, unspecified as acute or chronic, without hemorrhage or perforation: Secondary | ICD-10-CM | POA: Diagnosis present

## 2020-07-17 DIAGNOSIS — I700 Atherosclerosis of aorta: Secondary | ICD-10-CM | POA: Diagnosis not present

## 2020-07-17 DIAGNOSIS — K6389 Other specified diseases of intestine: Secondary | ICD-10-CM | POA: Diagnosis present

## 2020-07-17 DIAGNOSIS — Z8711 Personal history of peptic ulcer disease: Secondary | ICD-10-CM | POA: Diagnosis not present

## 2020-07-17 DIAGNOSIS — J930 Spontaneous tension pneumothorax: Secondary | ICD-10-CM | POA: Diagnosis not present

## 2020-07-17 DIAGNOSIS — K635 Polyp of colon: Principal | ICD-10-CM | POA: Diagnosis present

## 2020-07-17 DIAGNOSIS — Z789 Other specified health status: Secondary | ICD-10-CM | POA: Diagnosis present

## 2020-07-17 DIAGNOSIS — Z9689 Presence of other specified functional implants: Secondary | ICD-10-CM

## 2020-07-17 DIAGNOSIS — J189 Pneumonia, unspecified organism: Secondary | ICD-10-CM | POA: Diagnosis not present

## 2020-07-17 DIAGNOSIS — Z681 Body mass index (BMI) 19 or less, adult: Secondary | ICD-10-CM

## 2020-07-17 DIAGNOSIS — R579 Shock, unspecified: Secondary | ICD-10-CM | POA: Diagnosis not present

## 2020-07-17 DIAGNOSIS — Z9049 Acquired absence of other specified parts of digestive tract: Secondary | ICD-10-CM

## 2020-07-17 DIAGNOSIS — J9602 Acute respiratory failure with hypercapnia: Secondary | ICD-10-CM | POA: Diagnosis not present

## 2020-07-17 DIAGNOSIS — J9690 Respiratory failure, unspecified, unspecified whether with hypoxia or hypercapnia: Secondary | ICD-10-CM | POA: Diagnosis not present

## 2020-07-17 DIAGNOSIS — J939 Pneumothorax, unspecified: Secondary | ICD-10-CM

## 2020-07-17 DIAGNOSIS — Z4682 Encounter for fitting and adjustment of non-vascular catheter: Secondary | ICD-10-CM | POA: Diagnosis not present

## 2020-07-17 DIAGNOSIS — Z0189 Encounter for other specified special examinations: Secondary | ICD-10-CM

## 2020-07-17 DIAGNOSIS — D122 Benign neoplasm of ascending colon: Secondary | ICD-10-CM | POA: Diagnosis not present

## 2020-07-17 DIAGNOSIS — R0902 Hypoxemia: Secondary | ICD-10-CM | POA: Diagnosis not present

## 2020-07-17 DIAGNOSIS — G8929 Other chronic pain: Secondary | ICD-10-CM | POA: Diagnosis present

## 2020-07-17 DIAGNOSIS — Z720 Tobacco use: Secondary | ICD-10-CM | POA: Diagnosis present

## 2020-07-17 DIAGNOSIS — K660 Peritoneal adhesions (postprocedural) (postinfection): Secondary | ICD-10-CM | POA: Diagnosis present

## 2020-07-17 DIAGNOSIS — J984 Other disorders of lung: Secondary | ICD-10-CM | POA: Diagnosis not present

## 2020-07-17 DIAGNOSIS — Z452 Encounter for adjustment and management of vascular access device: Secondary | ICD-10-CM | POA: Diagnosis not present

## 2020-07-17 DIAGNOSIS — J440 Chronic obstructive pulmonary disease with acute lower respiratory infection: Secondary | ICD-10-CM | POA: Diagnosis not present

## 2020-07-17 DIAGNOSIS — R188 Other ascites: Secondary | ICD-10-CM | POA: Diagnosis not present

## 2020-07-17 DIAGNOSIS — R739 Hyperglycemia, unspecified: Secondary | ICD-10-CM | POA: Diagnosis present

## 2020-07-17 DIAGNOSIS — F1290 Cannabis use, unspecified, uncomplicated: Secondary | ICD-10-CM | POA: Diagnosis present

## 2020-07-17 DIAGNOSIS — J90 Pleural effusion, not elsewhere classified: Secondary | ICD-10-CM | POA: Diagnosis not present

## 2020-07-17 DIAGNOSIS — Z79891 Long term (current) use of opiate analgesic: Secondary | ICD-10-CM

## 2020-07-17 DIAGNOSIS — D62 Acute posthemorrhagic anemia: Secondary | ICD-10-CM | POA: Diagnosis not present

## 2020-07-17 DIAGNOSIS — Z20822 Contact with and (suspected) exposure to covid-19: Secondary | ICD-10-CM | POA: Diagnosis present

## 2020-07-17 DIAGNOSIS — K639 Disease of intestine, unspecified: Secondary | ICD-10-CM | POA: Diagnosis present

## 2020-07-17 DIAGNOSIS — Z8719 Personal history of other diseases of the digestive system: Secondary | ICD-10-CM

## 2020-07-17 DIAGNOSIS — Z79899 Other long term (current) drug therapy: Secondary | ICD-10-CM

## 2020-07-17 DIAGNOSIS — K265 Chronic or unspecified duodenal ulcer with perforation: Secondary | ICD-10-CM | POA: Diagnosis not present

## 2020-07-17 DIAGNOSIS — S7002XA Contusion of left hip, initial encounter: Secondary | ICD-10-CM | POA: Diagnosis present

## 2020-07-17 DIAGNOSIS — R0603 Acute respiratory distress: Secondary | ICD-10-CM

## 2020-07-17 DIAGNOSIS — J9601 Acute respiratory failure with hypoxia: Secondary | ICD-10-CM | POA: Diagnosis not present

## 2020-07-17 DIAGNOSIS — D123 Benign neoplasm of transverse colon: Secondary | ICD-10-CM | POA: Diagnosis not present

## 2020-07-17 DIAGNOSIS — Z4659 Encounter for fitting and adjustment of other gastrointestinal appliance and device: Secondary | ICD-10-CM

## 2020-07-17 DIAGNOSIS — I7090 Unspecified atherosclerosis: Secondary | ICD-10-CM | POA: Diagnosis not present

## 2020-07-17 LAB — COMPREHENSIVE METABOLIC PANEL
ALT: 18 U/L (ref 0–44)
AST: 32 U/L (ref 15–41)
Albumin: 3.1 g/dL — ABNORMAL LOW (ref 3.5–5.0)
Alkaline Phosphatase: 54 U/L (ref 38–126)
Anion gap: 9 (ref 5–15)
BUN: 6 mg/dL — ABNORMAL LOW (ref 8–23)
CO2: 23 mmol/L (ref 22–32)
Calcium: 8.4 mg/dL — ABNORMAL LOW (ref 8.9–10.3)
Chloride: 104 mmol/L (ref 98–111)
Creatinine, Ser: 0.97 mg/dL (ref 0.61–1.24)
GFR calc Af Amer: >60 mL/min (ref >60)
GFR calc non Af Amer: >60 mL/min (ref >60)
Glucose, Bld: 219 mg/dL — ABNORMAL HIGH (ref 70–99)
Potassium: 3.5 mmol/L (ref 3.5–5.1)
Sodium: 136 mmol/L (ref 135–145)
Total Bilirubin: 0.4 mg/dL (ref 0.3–1.2)
Total Protein: 5.3 g/dL — ABNORMAL LOW (ref 6.5–8.1)

## 2020-07-17 LAB — CBC
HCT: 38 % — ABNORMAL LOW (ref 39.0–52.0)
Hemoglobin: 12.5 g/dL — ABNORMAL LOW (ref 13.0–17.0)
MCH: 36.3 pg — ABNORMAL HIGH (ref 26.0–34.0)
MCHC: 32.9 g/dL (ref 30.0–36.0)
MCV: 110.5 fL — ABNORMAL HIGH (ref 80.0–100.0)
Platelets: 211 10*3/uL (ref 150–400)
RBC: 3.44 MIL/uL — ABNORMAL LOW (ref 4.22–5.81)
RDW: 12.3 % (ref 11.5–15.5)
WBC: 18.8 10*3/uL — ABNORMAL HIGH (ref 4.0–10.5)
nRBC: 0 % (ref 0.0–0.2)

## 2020-07-17 LAB — ABO/RH: ABO/RH(D): A POS

## 2020-07-17 LAB — MAGNESIUM: Magnesium: 1.6 mg/dL — ABNORMAL LOW (ref 1.7–2.4)

## 2020-07-17 LAB — PHOSPHORUS: Phosphorus: 3.1 mg/dL (ref 2.5–4.6)

## 2020-07-17 SURGERY — COLECTOMY, PARTIAL, ROBOT-ASSISTED, LAPAROSCOPIC
Anesthesia: General | Site: Abdomen

## 2020-07-17 MED ORDER — PHENYLEPHRINE 40 MCG/ML (10ML) SYRINGE FOR IV PUSH (FOR BLOOD PRESSURE SUPPORT)
PREFILLED_SYRINGE | INTRAVENOUS | Status: DC | PRN
  Administered 2020-07-17: 120 ug via INTRAVENOUS

## 2020-07-17 MED ORDER — PSYLLIUM 95 % PO PACK
1.0000 | PACK | Freq: Two times a day (BID) | ORAL | Status: DC
  Administered 2020-07-17 – 2020-07-18 (×2): 1 via ORAL
  Filled 2020-07-17 (×4): qty 1

## 2020-07-17 MED ORDER — LIDOCAINE 2% (20 MG/ML) 5 ML SYRINGE
INTRAMUSCULAR | Status: AC
  Filled 2020-07-17: qty 5

## 2020-07-17 MED ORDER — ACETAMINOPHEN 500 MG PO TABS
1000.0000 mg | ORAL_TABLET | ORAL | Status: AC
  Administered 2020-07-17: 1000 mg via ORAL
  Filled 2020-07-17: qty 2

## 2020-07-17 MED ORDER — PROPOFOL 10 MG/ML IV BOLUS
INTRAVENOUS | Status: AC
  Filled 2020-07-17: qty 20

## 2020-07-17 MED ORDER — POLYETHYLENE GLYCOL 3350 17 GM/SCOOP PO POWD: 1.0000 | Freq: Once | ORAL | Status: DC

## 2020-07-17 MED ORDER — LIP MEDEX EX OINT
1.0000 "application " | TOPICAL_OINTMENT | Freq: Two times a day (BID) | CUTANEOUS | Status: DC
  Administered 2020-07-18 – 2020-07-24 (×13): 1 via TOPICAL
  Filled 2020-07-17 (×3): qty 7

## 2020-07-17 MED ORDER — LACTATED RINGERS IR SOLN
Status: DC | PRN
  Administered 2020-07-17: 1000 mL

## 2020-07-17 MED ORDER — 0.9 % SODIUM CHLORIDE (POUR BTL) OPTIME
TOPICAL | Status: DC | PRN
  Administered 2020-07-17: 2000 mL

## 2020-07-17 MED ORDER — GABAPENTIN 100 MG PO CAPS
200.0000 mg | ORAL_CAPSULE | Freq: Three times a day (TID) | ORAL | Status: DC
  Administered 2020-07-17 – 2020-07-18 (×4): 200 mg via ORAL
  Filled 2020-07-17 (×4): qty 2

## 2020-07-17 MED ORDER — LACTATED RINGERS IV SOLN: INTRAVENOUS | Status: DC

## 2020-07-17 MED ORDER — SODIUM CHLORIDE 0.9 % IV SOLN
2.0000 g | Freq: Two times a day (BID) | INTRAVENOUS | Status: AC
  Administered 2020-07-18: 2 g via INTRAVENOUS
  Filled 2020-07-17: qty 2

## 2020-07-17 MED ORDER — METOPROLOL TARTRATE 5 MG/5ML IV SOLN: 5.0000 mg | Freq: Four times a day (QID) | INTRAVENOUS | Status: DC | PRN

## 2020-07-17 MED ORDER — ENSURE SURGERY PO LIQD
237.0000 mL | Freq: Two times a day (BID) | ORAL | Status: DC
  Administered 2020-07-18 – 2020-07-19 (×2): 237 mL via ORAL
  Filled 2020-07-17 (×3): qty 237

## 2020-07-17 MED ORDER — MIDAZOLAM HCL 2 MG/2ML IJ SOLN
INTRAMUSCULAR | Status: AC
  Filled 2020-07-17: qty 2

## 2020-07-17 MED ORDER — ALVIMOPAN 12 MG PO CAPS
12.0000 mg | ORAL_CAPSULE | ORAL | Status: AC
  Administered 2020-07-17: 12 mg via ORAL
  Filled 2020-07-17: qty 1

## 2020-07-17 MED ORDER — PHENYLEPHRINE 40 MCG/ML (10ML) SYRINGE FOR IV PUSH (FOR BLOOD PRESSURE SUPPORT)
PREFILLED_SYRINGE | INTRAVENOUS | Status: AC
  Filled 2020-07-17: qty 10

## 2020-07-17 MED ORDER — PROPOFOL 10 MG/ML IV BOLUS
INTRAVENOUS | Status: DC | PRN
  Administered 2020-07-17: 160 mg via INTRAVENOUS

## 2020-07-17 MED ORDER — SODIUM CHLORIDE 0.9 % IV SOLN: 250.0000 mL | INTRAVENOUS | Status: DC | PRN

## 2020-07-17 MED ORDER — SODIUM CHLORIDE 0.9 % IV SOLN: Freq: Three times a day (TID) | INTRAVENOUS | Status: DC | PRN

## 2020-07-17 MED ORDER — PROCHLORPERAZINE MALEATE 10 MG PO TABS
10.0000 mg | ORAL_TABLET | Freq: Four times a day (QID) | ORAL | Status: DC | PRN
  Filled 2020-07-17: qty 1

## 2020-07-17 MED ORDER — ENOXAPARIN SODIUM 40 MG/0.4ML ~~LOC~~ SOLN
40.0000 mg | SUBCUTANEOUS | Status: DC
  Administered 2020-07-18: 09:00:00 40 mg via SUBCUTANEOUS
  Filled 2020-07-17: qty 0.4

## 2020-07-17 MED ORDER — LIDOCAINE 2% (20 MG/ML) 5 ML SYRINGE
INTRAMUSCULAR | Status: DC | PRN
  Administered 2020-07-17: 80 mg via INTRAVENOUS

## 2020-07-17 MED ORDER — DEXAMETHASONE SODIUM PHOSPHATE 10 MG/ML IJ SOLN
INTRAMUSCULAR | Status: AC
  Filled 2020-07-17: qty 1

## 2020-07-17 MED ORDER — LIDOCAINE HCL 2 % IJ SOLN
INTRAMUSCULAR | Status: AC
  Filled 2020-07-17: qty 20

## 2020-07-17 MED ORDER — DEXAMETHASONE SODIUM PHOSPHATE 10 MG/ML IJ SOLN
INTRAMUSCULAR | Status: DC | PRN
  Administered 2020-07-17: 10 mg via INTRAVENOUS

## 2020-07-17 MED ORDER — THIAMINE HCL 100 MG PO TABS
100.0000 mg | ORAL_TABLET | Freq: Every day | ORAL | Status: DC
  Administered 2020-07-18: 09:00:00 100 mg via ORAL
  Filled 2020-07-17 (×2): qty 1

## 2020-07-17 MED ORDER — BISACODYL 5 MG PO TBEC: 20.0000 mg | DELAYED_RELEASE_TABLET | Freq: Once | ORAL | Status: DC

## 2020-07-17 MED ORDER — METRONIDAZOLE 500 MG PO TABS
1000.0000 mg | ORAL_TABLET | ORAL | Status: DC
  Filled 2020-07-17: qty 2

## 2020-07-17 MED ORDER — DIPHENHYDRAMINE HCL 12.5 MG/5ML PO ELIX: 12.5000 mg | ORAL_SOLUTION | Freq: Four times a day (QID) | ORAL | Status: DC | PRN

## 2020-07-17 MED ORDER — ROCURONIUM BROMIDE 10 MG/ML (PF) SYRINGE
PREFILLED_SYRINGE | INTRAVENOUS | Status: AC
  Filled 2020-07-17: qty 10

## 2020-07-17 MED ORDER — CHLORHEXIDINE GLUCONATE 0.12 % MT SOLN
15.0000 mL | Freq: Once | OROMUCOSAL | Status: AC
  Administered 2020-07-17: 15 mL via OROMUCOSAL

## 2020-07-17 MED ORDER — ONDANSETRON HCL 4 MG/2ML IJ SOLN: 4.0000 mg | Freq: Four times a day (QID) | INTRAMUSCULAR | Status: DC | PRN

## 2020-07-17 MED ORDER — GABAPENTIN 300 MG PO CAPS
300.0000 mg | ORAL_CAPSULE | ORAL | Status: AC
  Administered 2020-07-17: 300 mg via ORAL
  Filled 2020-07-17: qty 1

## 2020-07-17 MED ORDER — HYDROMORPHONE HCL 1 MG/ML IJ SOLN
INTRAMUSCULAR | Status: AC
  Administered 2020-07-17: 0.5 mg via INTRAVENOUS
  Filled 2020-07-17: qty 1

## 2020-07-17 MED ORDER — ALUM & MAG HYDROXIDE-SIMETH 200-200-20 MG/5ML PO SUSP: 30.0000 mL | Freq: Four times a day (QID) | ORAL | Status: DC | PRN

## 2020-07-17 MED ORDER — LIDOCAINE 20MG/ML (2%) 15 ML SYRINGE OPTIME
INTRAMUSCULAR | Status: DC | PRN
  Administered 2020-07-17: 1.5 mg/kg/h via INTRAVENOUS

## 2020-07-17 MED ORDER — LORAZEPAM 2 MG/ML IJ SOLN
1.0000 mg | INTRAMUSCULAR | Status: DC | PRN
  Administered 2020-07-18: 1 mg via INTRAVENOUS
  Filled 2020-07-17: qty 1

## 2020-07-17 MED ORDER — BUPIVACAINE-EPINEPHRINE (PF) 0.25% -1:200000 IJ SOLN
INTRAMUSCULAR | Status: AC
  Filled 2020-07-17: qty 30

## 2020-07-17 MED ORDER — SODIUM CHLORIDE 0.9% FLUSH
3.0000 mL | Freq: Two times a day (BID) | INTRAVENOUS | Status: DC
  Administered 2020-07-17 – 2020-07-20 (×6): 3 mL via INTRAVENOUS

## 2020-07-17 MED ORDER — LACTATED RINGERS IV SOLN: INTRAVENOUS | Status: AC

## 2020-07-17 MED ORDER — SODIUM CHLORIDE 0.9 % IV SOLN
2.0000 g | INTRAVENOUS | Status: AC
  Administered 2020-07-17: 2 g via INTRAVENOUS
  Filled 2020-07-17: qty 2

## 2020-07-17 MED ORDER — HYDROMORPHONE HCL 1 MG/ML IJ SOLN
0.5000 mg | INTRAMUSCULAR | Status: DC | PRN
  Administered 2020-07-17: 1 mg via INTRAVENOUS
  Filled 2020-07-17: qty 1
  Filled 2020-07-17: qty 2

## 2020-07-17 MED ORDER — ALBUTEROL SULFATE (2.5 MG/3ML) 0.083% IN NEBU
3.0000 mL | INHALATION_SOLUTION | Freq: Four times a day (QID) | RESPIRATORY_TRACT | Status: DC | PRN
  Filled 2020-07-17: qty 3

## 2020-07-17 MED ORDER — ENOXAPARIN SODIUM 40 MG/0.4ML ~~LOC~~ SOLN
40.0000 mg | Freq: Once | SUBCUTANEOUS | Status: AC
  Administered 2020-07-17: 40 mg via SUBCUTANEOUS
  Filled 2020-07-17: qty 0.4

## 2020-07-17 MED ORDER — MAGIC MOUTHWASH
15.0000 mL | Freq: Four times a day (QID) | ORAL | Status: DC | PRN
  Filled 2020-07-17: qty 15

## 2020-07-17 MED ORDER — ROCURONIUM BROMIDE 10 MG/ML (PF) SYRINGE
PREFILLED_SYRINGE | INTRAVENOUS | Status: DC | PRN
  Administered 2020-07-17 (×2): 10 mg via INTRAVENOUS
  Administered 2020-07-17: 60 mg via INTRAVENOUS
  Administered 2020-07-17: 10 mg via INTRAVENOUS

## 2020-07-17 MED ORDER — ACETAMINOPHEN 500 MG PO TABS
1000.0000 mg | ORAL_TABLET | Freq: Four times a day (QID) | ORAL | Status: DC
  Administered 2020-07-17 – 2020-07-19 (×6): 1000 mg via ORAL
  Filled 2020-07-17 (×6): qty 2

## 2020-07-17 MED ORDER — BUPIVACAINE LIPOSOME 1.3 % IJ SUSP
INTRAMUSCULAR | Status: DC | PRN
  Administered 2020-07-17: 20 mL

## 2020-07-17 MED ORDER — ACETAMINOPHEN 500 MG PO TABS: 1000.0000 mg | ORAL_TABLET | ORAL | Status: DC

## 2020-07-17 MED ORDER — METHOCARBAMOL 1000 MG/10ML IJ SOLN
1000.0000 mg | Freq: Four times a day (QID) | INTRAVENOUS | Status: DC | PRN
  Filled 2020-07-17: qty 10

## 2020-07-17 MED ORDER — TRAMADOL HCL 50 MG PO TABS: 50.0000 mg | ORAL_TABLET | Freq: Four times a day (QID) | ORAL | 0 refills | Status: DC | PRN

## 2020-07-17 MED ORDER — FENTANYL CITRATE (PF) 100 MCG/2ML IJ SOLN
INTRAMUSCULAR | Status: DC | PRN
  Administered 2020-07-17 (×2): 50 ug via INTRAVENOUS
  Administered 2020-07-17: 100 ug via INTRAVENOUS
  Administered 2020-07-17: 50 ug via INTRAVENOUS

## 2020-07-17 MED ORDER — TRAMADOL HCL 50 MG PO TABS
50.0000 mg | ORAL_TABLET | Freq: Four times a day (QID) | ORAL | Status: DC | PRN
  Administered 2020-07-18 – 2020-07-19 (×2): 50 mg via ORAL
  Filled 2020-07-17 (×2): qty 1

## 2020-07-17 MED ORDER — HYDROMORPHONE HCL 1 MG/ML IJ SOLN
0.2500 mg | INTRAMUSCULAR | Status: DC | PRN
  Administered 2020-07-17: 0.5 mg via INTRAVENOUS

## 2020-07-17 MED ORDER — ORAL CARE MOUTH RINSE: 15.0000 mL | Freq: Once | OROMUCOSAL | Status: AC

## 2020-07-17 MED ORDER — ADULT MULTIVITAMIN W/MINERALS CH
1.0000 | ORAL_TABLET | Freq: Every day | ORAL | Status: DC
  Administered 2020-07-17 – 2020-07-18 (×2): 1 via ORAL
  Filled 2020-07-17 (×2): qty 1

## 2020-07-17 MED ORDER — PROCHLORPERAZINE EDISYLATE 10 MG/2ML IJ SOLN
5.0000 mg | Freq: Four times a day (QID) | INTRAMUSCULAR | Status: DC | PRN
  Administered 2020-07-17: 10 mg via INTRAVENOUS
  Filled 2020-07-17: qty 2

## 2020-07-17 MED ORDER — NEOMYCIN SULFATE 500 MG PO TABS
1000.0000 mg | ORAL_TABLET | ORAL | Status: DC
  Filled 2020-07-17: qty 2

## 2020-07-17 MED ORDER — THIAMINE HCL 100 MG/ML IJ SOLN
100.0000 mg | Freq: Every day | INTRAMUSCULAR | Status: DC
  Administered 2020-07-17 – 2020-07-23 (×6): 100 mg via INTRAVENOUS
  Filled 2020-07-17 (×6): qty 2

## 2020-07-17 MED ORDER — FENTANYL CITRATE (PF) 250 MCG/5ML IJ SOLN
INTRAMUSCULAR | Status: AC
  Filled 2020-07-17: qty 5

## 2020-07-17 MED ORDER — FOLIC ACID 1 MG PO TABS
1.0000 mg | ORAL_TABLET | Freq: Every day | ORAL | Status: DC
  Administered 2020-07-17 – 2020-07-24 (×8): 1 mg via ORAL
  Filled 2020-07-17 (×8): qty 1

## 2020-07-17 MED ORDER — MIDAZOLAM HCL 5 MG/5ML IJ SOLN
INTRAMUSCULAR | Status: DC | PRN
  Administered 2020-07-17: 2 mg via INTRAVENOUS

## 2020-07-17 MED ORDER — KETAMINE HCL 10 MG/ML IJ SOLN
INTRAMUSCULAR | Status: DC | PRN
  Administered 2020-07-17: 15 mg via INTRAVENOUS

## 2020-07-17 MED ORDER — BUPIVACAINE-EPINEPHRINE (PF) 0.25% -1:200000 IJ SOLN
INTRAMUSCULAR | Status: DC | PRN
  Administered 2020-07-17: 60 mL

## 2020-07-17 MED ORDER — DIPHENHYDRAMINE HCL 50 MG/ML IJ SOLN: 12.5000 mg | Freq: Four times a day (QID) | INTRAMUSCULAR | Status: DC | PRN

## 2020-07-17 MED ORDER — ONDANSETRON HCL 4 MG/2ML IJ SOLN
INTRAMUSCULAR | Status: DC | PRN
  Administered 2020-07-17: 4 mg via INTRAVENOUS

## 2020-07-17 MED ORDER — ONDANSETRON HCL 4 MG PO TABS: 4.0000 mg | ORAL_TABLET | Freq: Four times a day (QID) | ORAL | Status: DC | PRN

## 2020-07-17 MED ORDER — ONDANSETRON HCL 4 MG/2ML IJ SOLN
INTRAMUSCULAR | Status: AC
  Filled 2020-07-17: qty 2

## 2020-07-17 MED ORDER — LORAZEPAM 1 MG PO TABS
1.0000 mg | ORAL_TABLET | ORAL | Status: DC | PRN
  Administered 2020-07-17 – 2020-07-18 (×2): 1 mg via ORAL
  Filled 2020-07-17 (×2): qty 1

## 2020-07-17 MED ORDER — SODIUM CHLORIDE 0.9% FLUSH: 3.0000 mL | INTRAVENOUS | Status: DC | PRN

## 2020-07-17 MED ORDER — ALVIMOPAN 12 MG PO CAPS
12.0000 mg | ORAL_CAPSULE | Freq: Two times a day (BID) | ORAL | Status: DC
  Administered 2020-07-18 – 2020-07-21 (×6): 12 mg via ORAL
  Filled 2020-07-17 (×9): qty 1

## 2020-07-17 SURGICAL SUPPLY — 107 items
APPLIER CLIP 5 13 M/L LIGAMAX5 (MISCELLANEOUS)
APPLIER CLIP ROT 10 11.4 M/L (STAPLE)
BLADE EXTENDED COATED 6.5IN (ELECTRODE) IMPLANT
CANNULA REDUC XI 12-8 STAPL (CANNULA) ×1
CANNULA REDUC XI 12-8MM STAPL (CANNULA) ×1
CANNULA REDUCER 12-8 DVNC XI (CANNULA) ×1 IMPLANT
CELLS DAT CNTRL 66122 CELL SVR (MISCELLANEOUS) ×1 IMPLANT
CHLORAPREP W/TINT 26 (MISCELLANEOUS) IMPLANT
CLIP APPLIE 5 13 M/L LIGAMAX5 (MISCELLANEOUS) IMPLANT
CLIP APPLIE ROT 10 11.4 M/L (STAPLE) IMPLANT
COVER SURGICAL LIGHT HANDLE (MISCELLANEOUS) ×6 IMPLANT
COVER TIP SHEARS 8 DVNC (MISCELLANEOUS) ×1 IMPLANT
COVER TIP SHEARS 8MM DA VINCI (MISCELLANEOUS) ×2
COVER WAND RF STERILE (DRAPES) ×3 IMPLANT
DECANTER SPIKE VIAL GLASS SM (MISCELLANEOUS) IMPLANT
DEVICE TROCAR PUNCTURE CLOSURE (ENDOMECHANICALS) IMPLANT
DRAIN CHANNEL 19F RND (DRAIN) IMPLANT
DRAPE ARM DVNC X/XI (DISPOSABLE) ×4 IMPLANT
DRAPE COLUMN DVNC XI (DISPOSABLE) ×1 IMPLANT
DRAPE DA VINCI XI ARM (DISPOSABLE) ×8
DRAPE DA VINCI XI COLUMN (DISPOSABLE) ×2
DRAPE SURG IRRIG POUCH 19X23 (DRAPES) IMPLANT
DRSG OPSITE POSTOP 4X10 (GAUZE/BANDAGES/DRESSINGS) IMPLANT
DRSG OPSITE POSTOP 4X6 (GAUZE/BANDAGES/DRESSINGS) ×3 IMPLANT
DRSG OPSITE POSTOP 4X8 (GAUZE/BANDAGES/DRESSINGS) IMPLANT
DRSG TEGADERM 2-3/8X2-3/4 SM (GAUZE/BANDAGES/DRESSINGS) ×12 IMPLANT
DRSG TEGADERM 4X4.75 (GAUZE/BANDAGES/DRESSINGS) IMPLANT
ELECT PENCIL ROCKER SW 15FT (MISCELLANEOUS) IMPLANT
ELECT REM PT RETURN 15FT ADLT (MISCELLANEOUS) ×3 IMPLANT
ENDOLOOP SUT PDS II  0 18 (SUTURE)
ENDOLOOP SUT PDS II 0 18 (SUTURE) IMPLANT
EVACUATOR SILICONE 100CC (DRAIN) IMPLANT
GAUZE SPONGE 2X2 8PLY STRL LF (GAUZE/BANDAGES/DRESSINGS) ×1 IMPLANT
GLOVE ECLIPSE 8.0 STRL XLNG CF (GLOVE) ×9 IMPLANT
GLOVE INDICATOR 8.0 STRL GRN (GLOVE) ×9 IMPLANT
GOWN STRL REUS W/TWL XL LVL3 (GOWN DISPOSABLE) ×9 IMPLANT
GRASPER SUT TROCAR 14GX15 (MISCELLANEOUS) IMPLANT
HOLDER FOLEY CATH W/STRAP (MISCELLANEOUS) ×3 IMPLANT
IRRIG SUCT STRYKERFLOW 2 WTIP (MISCELLANEOUS) ×3
IRRIGATION SUCT STRKRFLW 2 WTP (MISCELLANEOUS) ×1 IMPLANT
KIT PROCEDURE DA VINCI SI (MISCELLANEOUS)
KIT PROCEDURE DVNC SI (MISCELLANEOUS) IMPLANT
KIT SIGMOIDOSCOPE (SET/KITS/TRAYS/PACK) IMPLANT
KIT TURNOVER KIT A (KITS) IMPLANT
NEEDLE INSUFFLATION 14GA 120MM (NEEDLE) ×3 IMPLANT
PACK CARDIOVASCULAR III (CUSTOM PROCEDURE TRAY) ×3 IMPLANT
PACK COLON (CUSTOM PROCEDURE TRAY) ×3 IMPLANT
PAD POSITIONING PINK XL (MISCELLANEOUS) ×3 IMPLANT
PORT LAP GEL ALEXIS MED 5-9CM (MISCELLANEOUS) IMPLANT
PROTECTOR NERVE ULNAR (MISCELLANEOUS) ×6 IMPLANT
RELOAD STAPLER 2.5X60 WHT DVNC (STAPLE) ×1 IMPLANT
RELOAD STAPLER 3.5X45 BLU DVNC (STAPLE) IMPLANT
RELOAD STAPLER 3.5X60 BLU DVNC (STAPLE) ×2 IMPLANT
RELOAD STAPLER 4.3X45 GRN DVNC (STAPLE) IMPLANT
RELOAD STAPLER 4.3X60 GRN DVNC (STAPLE) IMPLANT
RTRCTR WOUND ALEXIS 18CM MED (MISCELLANEOUS) ×3
SCISSORS LAP 5X35 DISP (ENDOMECHANICALS) ×6 IMPLANT
SEAL CANN UNIV 5-8 DVNC XI (MISCELLANEOUS) ×3 IMPLANT
SEAL XI 5MM-8MM UNIVERSAL (MISCELLANEOUS) ×6
SEALER VESSEL DA VINCI XI (MISCELLANEOUS) ×2
SEALER VESSEL EXT DVNC XI (MISCELLANEOUS) ×1 IMPLANT
SOLUTION ELECTROLUBE (MISCELLANEOUS) ×3 IMPLANT
SPONGE GAUZE 2X2 STER 10/PKG (GAUZE/BANDAGES/DRESSINGS) ×2
STAPLER 45 DA VINCI SURE FORM (STAPLE)
STAPLER 45 SUREFORM DVNC (STAPLE) IMPLANT
STAPLER 60 DA VINCI SURE FORM (STAPLE) ×2
STAPLER 60 SUREFORM DVNC (STAPLE) ×1 IMPLANT
STAPLER CANNULA SEAL DVNC XI (STAPLE) ×1 IMPLANT
STAPLER CANNULA SEAL XI (STAPLE) ×2
STAPLER RELOAD 2.5X60 WHITE (STAPLE) ×2
STAPLER RELOAD 2.5X60 WHT DVNC (STAPLE) ×1
STAPLER RELOAD 3.5X45 BLU DVNC (STAPLE)
STAPLER RELOAD 3.5X45 BLUE (STAPLE)
STAPLER RELOAD 3.5X60 BLU DVNC (STAPLE) ×2
STAPLER RELOAD 3.5X60 BLUE (STAPLE) ×4
STAPLER RELOAD 4.3X45 GREEN (STAPLE)
STAPLER RELOAD 4.3X45 GRN DVNC (STAPLE)
STAPLER RELOAD 4.3X60 GREEN (STAPLE)
STAPLER RELOAD 4.3X60 GRN DVNC (STAPLE)
STOPCOCK 4 WAY LG BORE MALE ST (IV SETS) ×6 IMPLANT
SURGILUBE 2OZ TUBE FLIPTOP (MISCELLANEOUS) IMPLANT
SUT MNCRL AB 4-0 PS2 18 (SUTURE) ×6 IMPLANT
SUT PDS AB 1 CT1 27 (SUTURE) ×6 IMPLANT
SUT PROLENE 0 CT 2 (SUTURE) IMPLANT
SUT PROLENE 2 0 KS (SUTURE) IMPLANT
SUT PROLENE 2 0 SH DA (SUTURE) IMPLANT
SUT SILK 2 0 (SUTURE)
SUT SILK 2 0 SH CR/8 (SUTURE) IMPLANT
SUT SILK 2-0 18XBRD TIE 12 (SUTURE) IMPLANT
SUT SILK 3 0 (SUTURE)
SUT SILK 3 0 SH CR/8 (SUTURE) IMPLANT
SUT SILK 3-0 18XBRD TIE 12 (SUTURE) IMPLANT
SUT V-LOC BARB 180 2/0GR6 GS22 (SUTURE) ×6
SUT VIC AB 3-0 SH 18 (SUTURE) IMPLANT
SUT VIC AB 3-0 SH 27 (SUTURE)
SUT VIC AB 3-0 SH 27XBRD (SUTURE) IMPLANT
SUT VICRYL 0 UR6 27IN ABS (SUTURE) ×3 IMPLANT
SUTURE V-LC BRB 180 2/0GR6GS22 (SUTURE) ×2 IMPLANT
SYR 10ML ECCENTRIC (SYRINGE) ×3 IMPLANT
SYS LAPSCP GELPORT 120MM (MISCELLANEOUS)
SYSTEM LAPSCP GELPORT 120MM (MISCELLANEOUS) IMPLANT
TOWEL OR NON WOVEN STRL DISP B (DISPOSABLE) ×3 IMPLANT
TRAY FOLEY MTR SLVR 16FR STAT (SET/KITS/TRAYS/PACK) ×3 IMPLANT
TROCAR ADV FIXATION 5X100MM (TROCAR) ×3 IMPLANT
TUBING CONNECTING 10 (TUBING) ×2 IMPLANT
TUBING CONNECTING 10' (TUBING) ×1
TUBING INSUFFLATION 10FT LAP (TUBING) ×3 IMPLANT

## 2020-07-17 NOTE — Discharge Instructions (Signed)
SURGERY: POST OP INSTRUCTIONS °(Surgery for small bowel obstruction, colon resection, etc) ° ° °###################################################################### ° °EAT °Gradually transition to a high fiber diet with a fiber supplement over the next few days after discharge ° °WALK °Walk an hour a day.  Control your pain to do that.   ° °CONTROL PAIN °Control pain so that you can walk, sleep, tolerate sneezing/coughing, go up/down stairs. ° °HAVE A BOWEL MOVEMENT DAILY °Keep your bowels regular to avoid problems.  OK to try a laxative to override constipation.  OK to use an antidairrheal to slow down diarrhea.  Call if not better after 2 tries ° °CALL IF YOU HAVE PROBLEMS/CONCERNS °Call if you are still struggling despite following these instructions. °Call if you have concerns not answered by these instructions ° °###################################################################### ° ° °DIET °Follow a light diet the first few days at home.  Start with a bland diet such as soups, liquids, starchy foods, low fat foods, etc.  If you feel full, bloated, or constipated, stay on a ful liquid or pureed/blenderized diet for a few days until you feel better and no longer constipated. °Be sure to drink plenty of fluids every day to avoid getting dehydrated (feeling dizzy, not urinating, etc.). °Gradually add a fiber supplement to your diet over the next week.  Gradually get back to a regular solid diet.  Avoid fast food or heavy meals the first week as you are more likely to get nauseated. °It is expected for your digestive tract to need a few months to get back to normal.  It is common for your bowel movements and stools to be irregular.  You will have occasional bloating and cramping that should eventually fade away.  Until you are eating solid food normally, off all pain medications, and back to regular activities; your bowels will not be normal. °Focus on eating a low-fat, high fiber diet the rest of your life  (See Getting to Good Bowel Health, below). ° °CARE of your INCISION or WOUND °It is good for closed incision and even open wounds to be washed every day.  Shower every day.  Short baths are fine.  Wash the incisions and wounds clean with soap & water.    °If you have a closed incision(s), wash the incision with soap & water every day.  You may leave closed incisions open to air if it is dry.   You may cover the incision with clean gauze & replace it after your daily shower for comfort. °If you have skin tapes (Steristrips) or skin glue (Dermabond) on your incision, leave them in place.  They will fall off on their own like a scab.  You may trim any edges that curl up with clean scissors.  If you have staples, set up an appointment for them to be removed in the office in 10 days after surgery.  °If you have a drain, wash around the skin exit site with soap & water and place a new dressing of gauze or band aid around the skin every day.  Keep the drain site clean & dry.    °If you have an open wound with packing, see wound care instructions.  In general, it is encouraged that you remove your dressing and packing, shower with soap & water, and replace your dressing once a day.  Pack the wound with clean gauze moistened with normal (0.9%) saline to keep the wound moist & uninfected.  Pressure on the dressing for 30 minutes will stop most wound   bleeding.  Eventually your body will heal & pull the open wound closed over the next few months.  °Raw open wounds will occasionally bleed or secrete yellow drainage until it heals closed.  Drain sites will drain a little until the drain is removed.  Even closed incisions can have mild bleeding or drainage the first few days until the skin edges scab over & seal.   °If you have an open wound with a wound vac, see wound vac care instructions. ° ° ° ° °ACTIVITIES as tolerated °Start light daily activities --- self-care, walking, climbing stairs-- beginning the day after surgery.   Gradually increase activities as tolerated.  Control your pain to be active.  Stop when you are tired.  Ideally, walk several times a day, eventually an hour a day.   °Most people are back to most day-to-day activities in a few weeks.  It takes 4-8 weeks to get back to unrestricted, intense activity. °If you can walk 30 minutes without difficulty, it is safe to try more intense activity such as jogging, treadmill, bicycling, low-impact aerobics, swimming, etc. °Save the most intensive and strenuous activity for last (Usually 4-8 weeks after surgery) such as sit-ups, heavy lifting, contact sports, etc.  Refrain from any intense heavy lifting or straining until you are off narcotics for pain control.  You will have off days, but things should improve week-by-week. °DO NOT PUSH THROUGH PAIN.  Let pain be your guide: If it hurts to do something, don't do it.  Pain is your body warning you to avoid that activity for another week until the pain goes down. °You may drive when you are no longer taking narcotic prescription pain medication, you can comfortably wear a seatbelt, and you can safely make sudden turns/stops to protect yourself without hesitating due to pain. °You may have sexual intercourse when it is comfortable. If it hurts to do something, stop. ° °MEDICATIONS °Take your usually prescribed home medications unless otherwise directed.   °Blood thinners:  °Usually you can restart any strong blood thinners after the second postoperative day.  It is OK to take aspirin right away.    ° If you are on strong blood thinners (warfarin/Coumadin, Plavix, Xerelto, Eliquis, Pradaxa, etc), discuss with your surgeon, medicine PCP, and/or cardiologist for instructions on when to restart the blood thinner & if blood monitoring is needed (PT/INR blood check, etc).   ° ° °PAIN CONTROL °Pain after surgery or related to activity is often due to strain/injury to muscle, tendon, nerves and/or incisions.  This pain is usually  short-term and will improve in a few months.  °To help speed the process of healing and to get back to regular activity more quickly, DO THE FOLLOWING THINGS TOGETHER: °1. Increase activity gradually.  DO NOT PUSH THROUGH PAIN °2. Use Ice and/or Heat °3. Try Gentle Massage and/or Stretching °4. Take over the counter pain medication °5. Take Narcotic prescription pain medication for more severe pain ° °Good pain control = faster recovery.  It is better to take more medicine to be more active than to stay in bed all day to avoid medications. °1.  Increase activity gradually °Avoid heavy lifting at first, then increase to lifting as tolerated over the next 6 weeks. °Do not “push through” the pain.  Listen to your body and avoid positions and maneuvers than reproduce the pain.  Wait a few days before trying something more intense °Walking an hour a day is encouraged to help your body recover faster   and more safely.  Start slowly and stop when getting sore.  If you can walk 30 minutes without stopping or pain, you can try more intense activity (running, jogging, aerobics, cycling, swimming, treadmill, sex, sports, weightlifting, etc.) °Remember: If it hurts to do it, then don’t do it! °2. Use Ice and/or Heat °You will have swelling and bruising around the incisions.  This will take several weeks to resolve. °Ice packs or heating pads (6-8 times a day, 30-60 minutes at a time) will help sooth soreness & bruising. °Some people prefer to use ice alone, heat alone, or alternate between ice & heat.  Experiment and see what works best for you.  Consider trying ice for the first few days to help decrease swelling and bruising; then, switch to heat to help relax sore spots and speed recovery. °Shower every day.  Short baths are fine.  It feels good!  Keep the incisions and wounds clean with soap & water.   °3. Try Gentle Massage and/or Stretching °Massage at the area of pain many times a day °Stop if you feel pain - do not  overdo it °4. Take over the counter pain medication °This helps the muscle and nerve tissues become less irritable and calm down faster °Choose ONE of the following over-the-counter anti-inflammatory medications: °Acetaminophen 500mg tabs (Tylenol) 1-2 pills with every meal and just before bedtime (avoid if you have liver problems or if you have acetaminophen in you narcotic prescription) °Naproxen 220mg tabs (ex. Aleve, Naprosyn) 1-2 pills twice a day (avoid if you have kidney, stomach, IBD, or bleeding problems) °Ibuprofen 200mg tabs (ex. Advil, Motrin) 3-4 pills with every meal and just before bedtime (avoid if you have kidney, stomach, IBD, or bleeding problems) °Take with food/snack several times a day as directed for at least 2 weeks to help keep pain / soreness down & more manageable. °5. Take Narcotic prescription pain medication for more severe pain °A prescription for strong pain control is often given to you upon discharge (for example: oxycodone/Percocet, hydrocodone/Norco/Vicodin, or tramadol/Ultram) °Take your pain medication as prescribed. °Be mindful that most narcotic prescriptions contain Tylenol (acetaminophen) as well - avoid taking too much Tylenol. °If you are having problems/concerns with the prescription medicine (does not control pain, nausea, vomiting, rash, itching, etc.), please call us [phone] to see if we need to switch you to a different pain medicine that will work better for you and/or control your side effects better. °If you need a refill on your pain medication, you must call the office before 4 pm and on weekdays only.  By federal law, prescriptions for narcotics cannot be called into a pharmacy.  They must be filled out on paper & picked up from our office by the patient or authorized caretaker.  Prescriptions cannot be filled after 4 pm nor on weekends.   ° °WHEN TO CALL US [phone] °Severe uncontrolled or worsening pain  °Fever over 101 F (38.5 C) °Concerns with  the incision: Worsening pain, redness, rash/hives, swelling, bleeding, or drainage °Reactions / problems with new medications (itching, rash, hives, nausea, etc.) °Nausea and/or vomiting °Difficulty urinating °Difficulty breathing °Worsening fatigue, dizziness, lightheadedness, blurred vision °Other concerns °If you are not getting better after two weeks or are noticing you are getting worse, contact our office [phone] for further advice.  We may need to adjust your medications, re-evaluate you in the office, send you to the emergency room, or see what other things we can do to help. °The   clinic staff is available to answer your questions during regular business hours (8:30am-5pm).  Please don’t hesitate to call and ask to speak to one of our nurses for clinical concerns.    °A surgeon from [facility] is always on call at the hospitals 24 hours/day °If you have a medical emergency, go to the nearest emergency room or call 911. ° °FOLLOW UP in our office °One the day of your discharge from the hospital (or the next business weekday), please call [facility] to set up or confirm an appointment to see your surgeon in the office for a follow-up appointment.  Usually it is 2-3 weeks after your surgery.   °If you have skin staples at your incision(s), let the office know so we can set up a time in the office for the nurse to remove them (usually around 10 days after surgery). °Make sure that you call for appointments the day of discharge (or the next business weekday) from the hospital to ensure a convenient appointment time. °IF YOU HAVE DISABILITY OR FAMILY LEAVE FORMS, BRING THEM TO THE OFFICE FOR PROCESSING.  DO NOT GIVE THEM TO YOUR DOCTOR. ° °[facility] °[address], [address], [place], [place]  [address] ? °[phone] - Main °[phone] - Toll Free,  [phone] - Fax °[email or web address] ° °GETTING TO GOOD BOWEL HEALTH. °It is  expected for your digestive tract to need a few months to get back to normal.  It is common for your bowel movements and stools to be irregular.  You will have occasional bloating and cramping that should eventually fade away.  Until you are eating solid food normally, off all pain medications, and back to regular activities; your bowels will not be normal.   °Avoiding constipation °The goal: ONE SOFT BOWEL MOVEMENT A DAY!    °Drink plenty of fluids.  Choose water first. °TAKE A FIBER SUPPLEMENT EVERY DAY THE REST OF YOUR LIFE °During your first week back home, gradually add back a fiber supplement every day °Experiment which form you can tolerate.   There are many forms such as powders, tablets, wafers, gummies, etc °Psyllium bran (Metamucil), methylcellulose (Citrucel), Miralax or Glycolax, Benefiber, Flax Seed.  °Adjust the dose week-by-week (1/2 dose/day to 6 doses a day) until you are moving your bowels 1-2 times a day.  Cut back the dose or try a different fiber product if it is giving you problems such as diarrhea or bloating. °Sometimes a laxative is needed to help jump-start bowels if constipated until the fiber supplement can help regulate your bowels.  If you are tolerating eating & you are farting, it is okay to try a gentle laxative such as double dose MiraLax, prune juice, or Milk of Magnesia.  Avoid using laxatives too often. °Stool softeners can sometimes help counteract the constipating effects of narcotic pain medicines.  It can also cause diarrhea, so avoid using for too long. °If you are still constipated despite taking fiber daily, eating solids, and a few doses of laxatives, call our office. °Controlling diarrhea °Try drinking liquids and eating bland foods for a few days to avoid stressing your intestines further. °Avoid dairy products (especially milk & ice cream) for a short time.  The intestines often can lose the ability to digest lactose when stressed. °Avoid foods that cause gassiness or  bloating.  Typical foods include beans and other legumes, cabbage, broccoli, and dairy foods.  Avoid greasy, spicy, fast foods.  Every person has   some sensitivity to other foods, so listen to your body and avoid those foods that trigger problems for you. °Probiotics (such as active yogurt, Align, etc) may help repopulate the intestines and colon with normal bacteria and calm down a sensitive digestive tract °Adding a fiber supplement gradually can help thicken stools by absorbing excess fluid and retrain the intestines to act more normally.  Slowly increase the dose over a few weeks.  Too much fiber too soon can backfire and cause cramping & bloating. °It is okay to try and slow down diarrhea with a few doses of antidiarrheal medicines.   °Bismuth subsalicylate (ex. Kayopectate, Pepto Bismol) for a few doses can help control diarrhea.  Avoid if pregnant.   °Loperamide (Imodium) can slow down diarrhea.  Start with one tablet (2mg) first.  Avoid if you are having fevers or severe pain.  °ILEOSTOMY PATIENTS WILL HAVE CHRONIC DIARRHEA since their colon is not in use.    °Drink plenty of liquids.  You will need to drink even more glasses of water/liquid a day to avoid getting dehydrated. °Record output from your ileostomy.  Expect to empty the bag every 3-4 hours at first.  Most people with a permanent ileostomy empty their bag 4-6 times at the least.   °Use antidiarrheal medicine (especially Imodium) several times a day to avoid getting dehydrated.  Start with a dose at bedtime & breakfast.  Adjust up or down as needed.  Increase antidiarrheal medications as directed to avoid emptying the bag more than 8 times a day (every 3 hours). °Work with your wound ostomy nurse to learn care for your ostomy.  See ostomy care instructions. °TROUBLESHOOTING IRREGULAR BOWELS °1) Start with a soft & bland diet. No spicy, greasy, or fried foods.  °2) Avoid gluten/wheat or dairy products from diet to see if symptoms improve. °3) Miralax  17gm or flax seed mixed in 8oz. water or juice-daily. May use 2-4 times a day as needed. °4) Gas-X, Phazyme, etc. as needed for gas & bloating.  °5) Prilosec (omeprazole) over-the-counter as needed °6)  Consider probiotics (Align, Activa, etc) to help calm the bowels down ° °Call your doctor if you are getting worse or not getting better.  Sometimes further testing (cultures, endoscopy, X-ray studies, CT scans, bloodwork, etc.) may be needed to help diagnose and treat the cause of the diarrhea. °[facility] °[address], [address], [place], [place]  [address] °[phone] - Main.    °[phone]  - Toll Free.   [phone] - Fax °[email or web address] ° ° °

## 2020-07-17 NOTE — Anesthesia Postprocedure Evaluation (Signed)
Anesthesia Post Note  Patient: Marvelous Bouwens  Procedure(s) Performed: ROBOTIC PROXIMAL COLECTOMY, LYSIS OF ADHESIONS, BILATERAL TAP BLOCK (N/A Abdomen)     Patient location during evaluation: PACU Anesthesia Type: General Level of consciousness: awake and alert Pain management: pain level controlled Vital Signs Assessment: post-procedure vital signs reviewed and stable Respiratory status: spontaneous breathing, nonlabored ventilation, respiratory function stable and patient connected to nasal cannula oxygen Cardiovascular status: blood pressure returned to baseline and stable Postop Assessment: no apparent nausea or vomiting Anesthetic complications: no   No complications documented.  Last Vitals:  Vitals:   07/17/20 1630 07/17/20 1645  BP: (!) 165/89 (!) 165/99  Pulse: 71 73  Resp: 18 20  Temp:  (!) 36.4 C  SpO2: 100% 100%    Last Pain:  Vitals:   07/17/20 1238  TempSrc:   PainSc: 0-No pain                 Tamera Pingley S

## 2020-07-17 NOTE — Interval H&P Note (Signed)
History and Physical Interval Note:  07/17/2020 12:44 PM  Timothy Shaffer  has presented today for surgery, with the diagnosis of COLON POLUP UNRESECTABLE BY COLONOSCOPY.  The various methods of treatment have been discussed with the patient and family. After consideration of risks, benefits and other options for treatment, the patient has consented to  Procedure(s): ROBOTIC PROXIMAL COLECTOMY (N/A) as a surgical intervention.  The patient's history has been reviewed, patient examined, no change in status, stable for surgery.  I have reviewed the patient's chart and labs.  Questions were answered to the patient's satisfaction.    I have re-reviewed the the patient's records, history, medications, and allergies.  I have re-examined the patient.  I again discussed intraoperative plans and goals of post-operative recovery.  The patient agrees to proceed.  Timothy Shaffer  [birth year] [id number]  Patient Care Team: Aura Dials, MD as PCP - General (Family Medicine) Michael Boston, MD as Consulting Physician (General Surgery) Wilford Corner, MD as Consulting Physician (Gastroenterology) Deliah Goody, PA-C as Physician Assistant (Family Medicine)  Patient Active Problem List   Diagnosis Date Noted   Adenomatous polyp of cecum 05/21/2020   History of adenomatous polyp of colon 05/21/2020   Tobacco abuse 05/21/2020   Moderate alcohol consumption 05/21/2020   PUD (peptic ulcer disease)    Perforated duodenal ulcer ([place]) 05/21/1992    Past Medical History:  Diagnosis Date   Asthma    allergies   Complication of anesthesia    Family history of adverse reaction to anesthesia    PONV   PONV (postoperative nausea and vomiting)    PUD (peptic ulcer disease)     Past Surgical History:  Procedure Laterality Date   COLONOSCOPY  04/2020   MASS EXCISION Left 04/24/2014   Procedure: LEFT LONG & RING FINGERS DISTAL INTERPHALANGEAL JOINT/CYST EXCISION;  Surgeon: Cammie Sickle., MD;   Location: [facility];  Service: Orthopedics;  Laterality: Left;   REPAIR OF PERFORATED ULCER  1993   [facility], [place], [place]    Social History   Socioeconomic History   Marital status: Married    Spouse name: Not on file   Number of children: Not on file   Years of education: Not on file   Highest education level: Not on file  Occupational History   Not on file  Tobacco Use   Smoking status: Current Every Day Smoker    Packs/day: 1.00    Years: 15.00    Pack years: 15.00    Types: Cigarettes   Smokeless tobacco: Never Used  Scientific laboratory technician Use: Never used  Substance and Sexual Activity   Alcohol use: Yes    Alcohol/week: 12.0 standard drinks    Types: 12 Cans of beer per week   Drug use: Yes    Types: Marijuana   Sexual activity: Not on file  Other Topics Concern   Not on file  Social History Narrative   Not on file   Social Determinants of Health   Financial Resource Strain:    Difficulty of Paying Living Expenses:   Food Insecurity:    Worried About Charity fundraiser in the Last Year:    Arboriculturist in the Last Year:   Transportation Needs:    Film/video editor (Medical):    Lack of Transportation (Non-Medical):   Physical Activity:    Days of Exercise per Week:    Minutes of Exercise per Session:   Stress:    Feeling  of Stress :   Social Connections:    Frequency of Communication with Friends and Family:    Frequency of Social Gatherings with Friends and Family:    Attends Religious Services:    Active Member of Clubs or Organizations:    Attends Music therapist:    Marital Status:   Intimate Partner Violence:    Fear of Current or Ex-Partner:    Emotionally Abused:    Physically Abused:    Sexually Abused:     History reviewed. No pertinent family history.  Medications Prior to Admission  Medication Sig Dispense Refill Last Dose   albuterol (VENTOLIN HFA) 108 (90 Base) MCG/ACT inhaler Inhale 1-2 puffs into  the lungs every 6 (six) hours as needed for wheezing or shortness of breath.   07/17/2020 at 0830   acetaminophen-codeine (TYLENOL #3) 300-30 MG per tablet Take 1-2 tablets by mouth every 4 (four) hours as needed for moderate pain. (Patient not taking: Reported on 06/25/2020) 30 tablet 0 Not Taking at Unknown time   cephALEXin (KEFLEX) 500 MG capsule Take 1 capsule (500 mg total) by mouth 3 (three) times daily. (Patient not taking: Reported on 06/25/2020) 12 capsule 0 Not Taking at Unknown time    Current Facility-Administered Medications  Medication Dose Route Frequency Provider Last Rate Last Admin   bisacodyl (DULCOLAX) EC tablet 20 mg  20 mg Oral Once Michael Boston, MD       bupivacaine liposome (EXPAREL) 1.3 % injection 266 mg  20 mL Infiltration Once Michael Boston, MD       cefoTEtan (CEFOTAN) 2 g in sodium chloride 0.9 % 100 mL IVPB  2 g Intravenous On Call to OR Michael Boston, MD       lactated ringers infusion   Intravenous Continuous Effie Berkshire, MD       neomycin [facility]) tablet 1,000 mg  1,000 mg Oral 3 times per day Michael Boston, MD       And   metroNIDAZOLE (FLAGYL) tablet 1,000 mg  1,000 mg Oral 3 times per day Michael Boston, MD       polyethylene glycol powder (GLYCOLAX/MIRALAX) container 255 g  1 Container Oral Once Michael Boston, MD         Allergies  Allergen Reactions   Nsaids     History of perforated ulcer    BP (!) 142/92   Pulse 84   Temp 98.6 F (37 C) (Oral)   Resp 17   Ht 5\' 11"  (1.803 m)   Wt 58.1 kg   SpO2 100%   BMI 17.85 kg/m   Labs: No results found for this or any previous visit (from the past 48 hour(s)).  Imaging / Studies: No results found.   Adin Hector, M.D., F.A.C.S. Gastrointestinal and Minimally Invasive Surgery [facility] [address] [place], [place] [address] [phone] Main / Paging  07/17/2020 12:45 PM    Adin Hector

## 2020-07-17 NOTE — Transfer of Care (Signed)
Immediate Anesthesia Transfer of Care Note  Patient: Timothy Shaffer  Procedure(s) Performed: ROBOTIC PROXIMAL COLECTOMY, LYSIS OF ADHESIONS, BILATERAL TAP BLOCK (N/A Abdomen)  Patient Location: PACU  Anesthesia Type:General  Level of Consciousness: awake, alert  and oriented  Airway & Oxygen Therapy: Patient Spontanous Breathing and Patient connected to face mask oxygen  Post-op Assessment: Report given to RN and Post -op Vital signs reviewed and stable  Post vital signs: Reviewed and stable  Last Vitals:  Vitals Value Taken Time  BP 156/90 07/17/20 1556  Temp    Pulse 77 07/17/20 1558  Resp 19 07/17/20 1558  SpO2 100 % 07/17/20 1558  Vitals shown include unvalidated device data.  Last Pain:  Vitals:   07/17/20 1238  TempSrc:   PainSc: 0-No pain         Complications: No complications documented.

## 2020-07-17 NOTE — H&P (Signed)
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 ` ` The patient is a active smoking gentleman who is followed by Dr. Sheryn Bison. Had declined colonoscopy offerred in 2009 age 34 in the past. Had a positive colon Fit test. Sent for endoscopy. Numerous colon polyps found. Small ones and rectosigmoid region removed. Large bulky mass in cecum biopsied. Felt not to be amenable to endoscopic resection. Pathology consistent with adenomatous polyp. Surgical consultation recommended by Dr. Michail Sermon to consider segmental resection.  Patient comes in today with his wife. He is a moderate smoker. Sounds like he is a moderate drinker as well. Seems like he tends to avoid doctors. He did have emergency surgery for what sounds like a perforated duodenal ulcer in the 1990s when they lived up in [place] at [facility]. He is not on any antacid medication. He takes ibuprofen sparingly. Moves his bowels twice a day. No hematochezia or melena. He claims he can walk a half hour without difficulty. No history of heart attack or stroke. No diabetes. He is not on any proton pump inhibitors.  No personal nor family history of GI/colon cancer, inflammatory bowel disease, irritable bowel syndrome, allergy such as Celiac Sprue, dietary/dairy problems, colitis, ulcers nor gastritis. No recent sick contacts/gastroenteritis. No travel outside the country. No changes in diet. No dysphagia to solids or liquids. No significant heartburn or reflux. No melena,  hematemesis, coffee ground emesis. No evidence of prior gastric/peptic ulceration.  (Review of systems as stated in this history (HPI) or in the review of systems. Otherwise all other 12 point ROS are negative) ` ` `  This patient encounter took 35 minutes today to perform the following: obtain history, perform exam, review outside records, interpret tests & imaging, counsel the patient on their diagnosis; and, document this encounter, including findings & plan in the electronic health record (EHR).   Past Surgical History Illene Regulus, CMA; 05/21/2020 10:17 AM) Resection of Small Bowel Tonsillectomy  Allergies (Alisha Spillers, CMA; 05/21/2020 10:18 AM) No Known Drug Allergies [05/21/2020]:  Medication History (Alisha Spillers, CMA; 05/21/2020 10:18 AM) No Current Medications Medications Reconciled  Social History Lars Mage Spillers, CMA; 05/21/2020 10:17 AM) Alcohol use Moderate alcohol use. Caffeine use Coffee. Illicit drug use Remotely quit drug use. Tobacco use Current every day smoker.  Family History Illene Regulus, CMA; 05/21/2020 10:17 AM) Breast Cancer Daughter.  Other Problems Lars Mage Spillers, CMA; 05/21/2020 10:17 AM) Gastric Ulcer     Review of Systems (Alisha Spillers CMA; 05/21/2020 10:17 AM) General Not Present- Appetite Loss, Chills, Fatigue, Fever, Night Sweats, Weight Gain and Weight Loss. Skin Present- Non-Healing Wounds. Not Present- Change in Wart/Mole, Dryness, Hives, Jaundice, New Lesions, Rash and Ulcer. HEENT Present- Wears glasses/contact lenses. Not Present- Earache, Hearing Loss, Hoarseness, Nose Bleed, Oral Ulcers, Ringing in the Ears, Seasonal Allergies, Sinus Pain, Sore Throat, Visual Disturbances and Yellow Eyes. Respiratory Not Present- Bloody sputum, Chronic Cough, Difficulty Breathing, Snoring and Wheezing. Breast Not Present- Breast Mass, Breast Pain, Nipple Discharge and Skin Changes. Cardiovascular Not Present-  Chest Pain, Difficulty Breathing Lying Down, Leg Cramps, Palpitations, Rapid Heart Rate, Shortness of Breath and Swelling of Extremities.  Gastrointestinal Not Present- Abdominal Pain, Bloating, Bloody Stool, Change in Bowel Habits, Chronic diarrhea, Constipation, Difficulty Swallowing, Excessive gas, Gets full quickly at meals, Hemorrhoids, Indigestion, Nausea, Rectal Pain and Vomiting. Male Genitourinary Not Present- Blood in Urine, Change in Urinary Stream, Frequency, Impotence, Nocturia, Painful Urination, Urgency and Urine Leakage.  Vitals (Alisha Spillers CMA; 05/21/2020 10:18 AM) 05/21/2020 10:17 AM Weight: 126 lb Height: 71in Body Surface Area: 1.73 m Body Mass Index: 17.57 kg/m  Temp.: 98.19F(Oral)  Pulse: 94 (Regular)  BP: 142/82(Sitting, Left Arm, Standard)   07/17/2020 BP (!) 142/92   Pulse 84   Temp 98.6 F (37 C) (Oral)   Resp 17   Ht 5\' 11"  (1.803 m)   Wt 58.1 kg   SpO2 100%   BMI 17.85 kg/m      Physical Exam Adin Hector MD; 05/21/2020 10:55 AM)  General Mental Status-Alert. General Appearance-Not in acute distress, Not Sickly. Orientation-Oriented X3. Hydration-Well hydrated. Voice-Normal. Note: thin male. Not quite cachectic. Moves slightly slowly but steadily.  Integumentary Global Assessment Upon inspection and palpation of skin surfaces of the - Axillae: non-tender, no inflammation or ulceration, no drainage. and Distribution of scalp and body hair is normal. General Characteristics Temperature - normal warmth is noted.  Head and Neck Head-normocephalic, atraumatic with no lesions or palpable masses. Face Global Assessment - atraumatic, no absence of expression. Neck Global Assessment - no abnormal movements, no bruit auscultated on the right, no bruit auscultated on the left, no decreased range of motion, non-tender. Trachea-midline. Thyroid Gland Characteristics - non-tender.  Eye Eyeball -  Left-Extraocular movements intact, No Nystagmus - Left. Eyeball - Right-Extraocular movements intact, No Nystagmus - Right. Cornea - Left-No Hazy - Left. Cornea - Right-No Hazy - Right. Sclera/Conjunctiva - Left-No scleral icterus, No Discharge - Left. Sclera/Conjunctiva - Right-No scleral icterus, No Discharge - Right. Pupil - Left-Direct reaction to light normal. Pupil - Right-Direct reaction to light normal.  ENMT Ears Pinna - Left - no drainage observed, no generalized tenderness observed. Pinna - Right - no drainage observed, no generalized tenderness observed. Nose and Sinuses External Inspection of the Nose - no destructive lesion observed. Inspection of the nares - Left - quiet respiration. Inspection of the nares - Right - quiet respiration. Mouth and Throat Lips - Upper Lip - no fissures observed, no pallor noted. Lower Lip - no fissures observed, no pallor noted. Nasopharynx - no discharge present. Oral Cavity/Oropharynx - Tongue - no dryness observed. Oral Mucosa - no cyanosis observed. Hypopharynx - no evidence of airway distress observed.  Chest and Lung Exam Inspection Movements - Normal and Symmetrical. Accessory muscles - No use of accessory muscles in breathing. Palpation Palpation of the chest reveals - Non-tender. Auscultation Breath sounds - Normal and Clear.  Cardiovascular Auscultation Rhythm - Regular. Murmurs & Other Heart Sounds - Auscultation of the heart reveals - No Murmurs and No Systolic Clicks.  Abdomen Inspection Inspection of the abdomen reveals - No Visible peristalsis and No Abnormal pulsations. Umbilicus - No Bleeding, No Urine drainage. Palpation/Percussion Palpation and Percussion of the abdomen reveal - Soft, Non Tender, No Rebound tenderness, No Rigidity (guarding) and No Cutaneous hyperesthesia. Note: Abdomen Flat and somewhat firm at first but then softens up. Upper midline incision. Very thin abdominal wall. Can feel  some stitches and the subcutaneous tissues tissue. No obvious hernia. Abdomen is nontender. Not distended. No umbilical or incisional hernias. No guarding.  Male Genitourinary Sexual Maturity Tanner 5 - Adult hair pattern and Adult penile size  and shape. Note: No inguinal hernias. Normal external genitalia. Epididymi, testes, and spermatic cords normal without any masses.  Rectal Note: deferred given recent colonoscopy. Apparently mild hemorrhoids noted.  Peripheral Vascular Upper Extremity Inspection - Left - No Cyanotic nailbeds - Left, Not Ischemic. Inspection - Right - No Cyanotic nailbeds - Right, Not Ischemic.  Neurologic Neurologic evaluation reveals -normal attention span and ability to concentrate, able to name objects and repeat phrases. Appropriate fund of knowledge , normal sensation and normal coordination. Mental Status Affect - not angry, not paranoid. Cranial Nerves-Normal Bilaterally. Gait-Normal.  Neuropsychiatric Mental status exam performed with findings of-able to articulate well with normal speech/language, rate, volume and coherence, thought content normal with ability to perform basic computations and apply abstract reasoning and no evidence of hallucinations, delusions, obsessions or homicidal/suicidal ideation.  Musculoskeletal Global Assessment Spine, Ribs and Pelvis - no instability, subluxation or laxity. Right Upper Extremity - no instability, subluxation or laxity.  Lymphatic Head & Neck  General Head & Neck Lymphatics: Bilateral - Description - No Localized lymphadenopathy. Axillary  General Axillary Region: Bilateral - Description - No Localized lymphadenopathy. Femoral & Inguinal  Generalized Femoral & Inguinal Lymphatics: Left - Description - No Localized lymphadenopathy. Right - Description - No Localized lymphadenopathy.    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  He would benefit from segmental resection. Proximal right ileocolectomy. Intracorporeal anastomosis. Reasonable for robotic approach. He has had prior major upper abdominal surgery for perforated ulcer decades ago. No other surgeries. That may increase OR time with lysis of adhesions but hopefully likelihood of conversion is low.  The anatomy & physiology of the digestive tract was discussed.  The pathophysiology of the colon was discussed.  Natural history risks without surgery was discussed.   I feel the risks of no intervention will lead to serious problems that outweigh the operative risks; therefore, I recommended a partial colectomy to remove the pathology.  Minimally invasive (Robotic/Laparoscopic) & open techniques were discussed.   Risks such as bleeding, infection, abscess, leak, reoperation, injury to other organs, need for repair of tissues / organs, possible ostomy, hernia, heart attack, stroke, death, and other risks were discussed.  I noted a good likelihood this will help address the problem.   Goals of post-operative recovery were discussed as well.   Need for adequate nutrition, daily bowel regimen and healthy physical activity, to optimize recovery was noted as well. We will work to minimize complications.  Educational materials were available as well.  Questions were answered.  The patient expresses understanding & wishes to proceed with surgery.   He is interested in proceeding. His wife agrees.  I strongly recommend he back off on alcohol use and quit smoking to minimize perioperative issues and complications. He will consider it.    Pt Education - [facility] STOP SMOKING!  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, [place], [place] [address] [phone] Fax [phone] Main/Paging  CONTACT INFORMATION: Weekday (9AM-5PM) concerns: Call [facility] main office at [phone] Weeknight (5PM-9AM) or Weekend/Holiday concerns: Check [email or web address] for General Surgery [facility] coverage (Please, do not use SecureChat as it is not reliable communication to operating surgeons for immediate patient care)

## 2020-07-17 NOTE — Op Note (Signed)
07/17/2020  3:54 PM  PATIENT:  Timothy Shaffer  69 y.o. male  Patient Care Team: Aura Dials, MD as PCP - General (Family Medicine) Michael Boston, MD as Consulting Physician (General Surgery) Wilford Corner, MD as Consulting Physician (Gastroenterology) Deliah Goody, PA-C as Physician Assistant (Family Medicine)  PRE-OPERATIVE DIAGNOSIS:  COLON POLUP UNRESECTABLE BY COLONOSCOPY  POST-OPERATIVE DIAGNOSIS:   CECAL COLON POLYP UNRESECTABLE BY COLONOSCOPY Transverse colon polyp s/p polypectomy with scar  PROCEDURE:  ROBOTIC PROXIMAL HEMICOLECTOMY ROBOTIC LYSIS OF ADHESIONS X 60MIN (1/2 CASE) BILATERAL TAP BLOCK  SURGEON:  Adin Hector, MD  ASSISTANT: Nadeen Landau, MD An experienced assistant was required given the standard of surgical care given the complexity of the case.  This assistant was needed for exposure, dissection, suctioning, retraction, instrument exchange, etc.   ANESTHESIA:     General  Nerve block provided with liposomal bupivacaine (Experel) mixed with 0.25% bupivacaine as a Bilateral TAP block x 1mL each side at the level of the transverse abdominis & preperitoneal spaces along the flank at the anterior axillary line, from subcostal ridge to iliac crest under laparoscopic guidance   Local field block at port sites & extraction wound  EBL:  Total I/O In: 1000 [I.V.:1000] Out: 150 [Urine:100; Blood:50]  Delay start of Pharmacological VTE agent (>24hrs) due to surgical blood loss or risk of bleeding:  no  DRAINS: none   SPECIMEN:  PROXIMAL "RIGHT" COLON  DISPOSITION OF SPECIMEN:  PATHOLOGY  COUNTS:  YES  PLAN OF CARE: Admit to inpatient   PATIENT DISPOSITION:  PACU - hemodynamically stable.  INDICATION:    Patient underwent colonoscopy Dr. Cannon Kettle with [facility] gastroenterology and found has numerous polyps.  Large polyp in cecum carpeting not amenable to endoscopic resection.  Adenomatous.  Surgical consultation requested I  recommended segmental resection:  The anatomy & physiology of the digestive tract was discussed.  The pathophysiology was discussed.  Natural history risks without surgery was discussed.   I worked to give an overview of the disease and the frequent need to have multispecialty involvement.  I feel the risks of no intervention will lead to serious problems that outweigh the operative risks; therefore, I recommended a partial colectomy to remove the pathology.  Laparoscopic & open techniques were discussed.   Risks such as bleeding, infection, abscess, leak, reoperation, possible ostomy, hernia, heart attack, death, and other risks were discussed.  I noted a good likelihood this will help address the problem.   Goals of post-operative recovery were discussed as well.  We will work to minimize complications.  An educational handout on the pathology was given as well.  Questions were answered.    The patient expresses understanding & wishes to proceed with surgery.  OR FINDINGS:   CASE DATA:  Type of patient?: Elective [facility] Private Case  Status of Case? Elective Scheduled  Infection Present At Time Of Surgery (PATOS)?  NO  Patient had flat carpeting adenomatous mass of cecum 6 cm maximal diameter.  No obvious nodularity or ulceration.  Tattooing at this region.  Patient also had tattooing at mid transverse colon near a stellate scar mostly consistent with prior polypectomy.  No obvious metastatic disease on visceral parietal peritoneum or liver.  It is an ileocolonic (distal ileum to mid transverse colon) isoperistaltic intracorporeal anastomosis that rests in the epigastric region.  DESCRIPTION:   Informed consent was confirmed.  The patient underwent general anaesthesia without difficulty.  The patient was positioned with arms tucked & secured appropriately.  VTE prevention  in place.  The patient's abdomen was clipped, prepped, & draped in a sterile fashion.  Surgical timeout confirmed our  plan.  The patient was positioned in reverse Trendelenburg.  Abdominal entry was gained using Varess technique at the left subcostal ridge on the anterior abdominal wall.  No elevated EtCO2 noted.  Port placed.  Camera inspection revealed no injury.  Extra ports were carefully placed under direct laparoscopic visualization.  We docked the Inituitive Vinci robot carefully and placed intstruments under visualization.  We could easily find tattooing at the cecum as well at the mid transverse colon.  Patient had moderate omental and central adhesions given his prior upper midline incision for perforated ulcer surgery decades ago.  Did robotically adhesions to free of the transverse colon greater omentum and adhesions off the central abdomen and bilateral upper quadrants.  No evidence of any gastrostomy tube site.  Transected the colon and greater omentum off the liver and mobilized off the gallbladder as well.  I mobilized & reflected the greater omentum and transverse colon in the right upper quadrant over the liver since it was very stretched out mesentery.   I was able to elevate the proximal colon to isolate the ileocolonic pedicle.  I scored the ileal mesentery just proximal to that.   I carried that further dissection in a medial to lateral fashion.  I was able to bluntly get into the retro-mesenteric plane on the right side.  I freed the proximal right sided colonic mesentery off the retroperitoneum including the duodenal sweep, pancreatic head, & Gerota's fascia of the right kidney. I was able to get underneath the hepatic flexure.  I was able to get underneath the proximal and mid transverse colon.  Able to come to the upper retroperitoneum and actually come through the retroperitoneum to the hepatic flexure.    I then proceeded to mobilize the terminal ileum & proximal "right" colon in a lateral to medial fashion.  I mobilized the distal ileal mesentery off its retroperitoneal and pelvic attachments.  I  mobilized the ascending colon off It is side wall attachments to the paracolic gutter and retroperitoneum.  I also mobilized the greater omentum off the mid transverse colon and mobilized the mid to proximal transverse colon in a superior to inferior fashion.  This allowed me to mobilize the hepatic flexure and get a complete mobilization of the proximal "right" colon to the mid-transverse colon.  Freed the omentum off the anterior abdominal wall and liver to get to the mid transverse colon distal to the second tattooing.  Freed adhesions off the gallbladder and liver and retroperitoneum.  Able to mobilize the hepatic flexure and the transverse colon off the retroperitoneum such that the entire proximal right colon was mobilized to the midline.  I could isolate the pathology. When he went ahead and proceeded with transection.  I transected the distal ileal mesentery and then transected at the distal ileum with a robotic stapler.  I then transected transverse colon mesentery midway.  Shows a region 8 cm distal to the most distal tattooing, trying to spare one of the middle colic pedicles.  Transected at the mid-transverse colon with a robotic stapler.  We assured hemostasis.   I did a side-to-side stapled anastomosis of ileum to mid-transverse colon using a 33mm robotic white load stapler x 1 firings in an isoperistaltic fashion.  (Distal stump of ileum to mid transverse colon for the distal end of the anastomosis.  Proximal end of colon stump to more proximal  ileum for the proximal end of the anastomosis).  I sewed the common staple channel wound with an absorbable suture ( 2-0 V-lock) in a running [place] fashion from each corner and meeting in the center.  I did meticulous inspection prove an airtight closure.   I protected the anastomosis line with an anterior omentopexy of greater omentum using V lock suture.  We did reinspection of the abdomen.  Hemostasis was good.   Ureters, retroperitoneum, and bowel  uninjured.  The anastomosis looked healthy.   We did irrigation of a few liters of saline and inspection to make sure there was good hemostasis.  No evidence of bowel injury or other concerns.  We placed the wound protector through the right suprapubic 12mm port site after it was enlarged in a Pfannenstiel fashion.  Specimen removed without incident.  Given the fact that there were 2 tattooing's we opened up the specimen.  Confirmed a large carpeting flat polyp in the cecum.  Seemed rather soft.  Size stellate scar in the proximal transverse colon near the second tattooing most likely consistent with prior polypectomy.  No other abnormalities.  We aspirated the irrigation.  Ports & wound protector removed.  We changed gloves per colon SSI prevention protocol.  Hemostasis was good.  Sterile unused instruments were used from this point.  I closed the skin at the port sites using Monocryl stitch and sterile dressing.  I closed the extraction wound using a 0 Vicryl vertical peritoneal closure and a #1 PDS transverse anterior rectal fascial closure like a small Pfannenstiel closure. I closed the skin with Monocryl stitches.  Patient is being extubated go to recovery room. I discussed postop care with the patient in detail the office & in the holding area. Instructions are written.  I discussed operative findings, updated the patient's status, discussed probable steps to recovery, and gave postoperative recommendations to the patient's spouse.  Recommendations were made.  Questions were answered.  She expressed understanding & appreciation.  Questions were answered.  The family expressed understanding & appreciation.  Adin Hector, M.D., F.A.C.S. Gastrointestinal and Minimally Invasive Surgery [facility] [address] [place], [place] [address] [phone] Main / Paging

## 2020-07-17 NOTE — Anesthesia Preprocedure Evaluation (Signed)
Anesthesia Evaluation  Patient identified by MRN, date of birth, ID band Patient awake    Reviewed: Allergy & Precautions, NPO status , Patient's Chart, lab work & pertinent test results  History of Anesthesia Complications (+) PONV  Airway Mallampati: II  TM Distance: >3 FB Neck ROM: Full    Dental no notable dental hx.    Pulmonary Current Smoker,    Pulmonary exam normal breath sounds clear to auscultation       Cardiovascular negative cardio ROS Normal cardiovascular exam Rhythm:Regular Rate:Normal     Neuro/Psych negative neurological ROS  negative psych ROS   GI/Hepatic negative GI ROS, (+)     substance abuse  alcohol use,   Endo/Other  negative endocrine ROS  Renal/GU negative Renal ROS  negative genitourinary   Musculoskeletal negative musculoskeletal ROS (+)   Abdominal   Peds negative pediatric ROS (+)  Hematology negative hematology ROS (+)   Anesthesia Other Findings   Reproductive/Obstetrics negative OB ROS                             Anesthesia Physical Anesthesia Plan  ASA: III  Anesthesia Plan: General   Post-op Pain Management:    Induction: Intravenous  PONV Risk Score and Plan: 2 and Ondansetron, Dexamethasone and Treatment may vary due to age or medical condition  Airway Management Planned: Oral ETT  Additional Equipment:   Intra-op Plan:   Post-operative Plan: Extubation in OR  Informed Consent: I have reviewed the patients History and Physical, chart, labs and discussed the procedure including the risks, benefits and alternatives for the proposed anesthesia with the patient or authorized representative who has indicated his/her understanding and acceptance.     Dental advisory given  Plan Discussed with: CRNA and Surgeon  Anesthesia Plan Comments:         Anesthesia Quick Evaluation

## 2020-07-17 NOTE — Anesthesia Procedure Notes (Signed)
Procedure Name: Intubation Date/Time: 07/17/2020 1:10 PM Performed by: Janoah Menna D, CRNA Pre-anesthesia Checklist: Patient identified, Emergency Drugs available, Suction available and Patient being monitored Patient Re-evaluated:Patient Re-evaluated prior to induction Oxygen Delivery Method: Circle system utilized Preoxygenation: Pre-oxygenation with 100% oxygen Induction Type: IV induction Ventilation: Mask ventilation without difficulty Laryngoscope Size: Mac and 4 Grade View: Grade I Tube type: Oral Tube size: 7.5 mm Number of attempts: 1 Airway Equipment and Method: Stylet Placement Confirmation: ETT inserted through vocal cords under direct vision,  positive ETCO2 and breath sounds checked- equal and bilateral Secured at: 22 cm Tube secured with: Tape Dental Injury: Teeth and Oropharynx as per pre-operative assessment

## 2020-07-18 ENCOUNTER — Inpatient Hospital Stay (HOSPITAL_COMMUNITY): Payer: Medicare HMO

## 2020-07-18 ENCOUNTER — Other Ambulatory Visit: Payer: Self-pay

## 2020-07-18 DIAGNOSIS — D120 Benign neoplasm of cecum: Secondary | ICD-10-CM

## 2020-07-18 LAB — BLOOD GAS, ARTERIAL
Acid-base deficit: 3.3 mmol/L — ABNORMAL HIGH (ref 0.0–2.0)
Bicarbonate: 20.3 mmol/L (ref 20.0–28.0)
Drawn by: 270211
FIO2: 21
O2 Saturation: 77.4 %
Patient temperature: 97.8
pCO2 arterial: 31.9 mmHg — ABNORMAL LOW (ref 32.0–48.0)
pH, Arterial: 7.417 (ref 7.350–7.450)
pO2, Arterial: 42.4 mmHg — ABNORMAL LOW (ref 83.0–108.0)

## 2020-07-18 LAB — BASIC METABOLIC PANEL
Anion gap: 8 (ref 5–15)
BUN: 14 mg/dL (ref 8–23)
CO2: 25 mmol/L (ref 22–32)
Calcium: 8.6 mg/dL — ABNORMAL LOW (ref 8.9–10.3)
Chloride: 102 mmol/L (ref 98–111)
Creatinine, Ser: 1.56 mg/dL — ABNORMAL HIGH (ref 0.61–1.24)
GFR calc Af Amer: 52 mL/min — ABNORMAL LOW (ref >60)
GFR calc non Af Amer: 45 mL/min — ABNORMAL LOW (ref >60)
Glucose, Bld: 222 mg/dL — ABNORMAL HIGH (ref 70–99)
Potassium: 4.6 mmol/L (ref 3.5–5.1)
Sodium: 135 mmol/L (ref 135–145)

## 2020-07-18 LAB — CBC
HCT: 30.4 % — ABNORMAL LOW (ref 39.0–52.0)
HCT: 27.8 % — ABNORMAL LOW (ref 39.0–52.0)
Hemoglobin: 10.1 g/dL — ABNORMAL LOW (ref 13.0–17.0)
Hemoglobin: 9.3 g/dL — ABNORMAL LOW (ref 13.0–17.0)
MCH: 36.5 pg — ABNORMAL HIGH (ref 26.0–34.0)
MCH: 36.8 pg — ABNORMAL HIGH (ref 26.0–34.0)
MCHC: 33.2 g/dL (ref 30.0–36.0)
MCHC: 33.5 g/dL (ref 30.0–36.0)
MCV: 109.7 fL — ABNORMAL HIGH (ref 80.0–100.0)
MCV: 109.9 fL — ABNORMAL HIGH (ref 80.0–100.0)
Platelets: 202 10*3/uL (ref 150–400)
Platelets: 227 10*3/uL (ref 150–400)
RBC: 2.77 MIL/uL — ABNORMAL LOW (ref 4.22–5.81)
RBC: 2.53 MIL/uL — ABNORMAL LOW (ref 4.22–5.81)
RDW: 12.1 % (ref 11.5–15.5)
RDW: 12.2 % (ref 11.5–15.5)
WBC: 17.7 10*3/uL — ABNORMAL HIGH (ref 4.0–10.5)
WBC: 18.9 10*3/uL — ABNORMAL HIGH (ref 4.0–10.5)
nRBC: 0 % (ref 0.0–0.2)
nRBC: 0 % (ref 0.0–0.2)

## 2020-07-18 LAB — GLUCOSE, CAPILLARY
Glucose-Capillary: 164 mg/dL — ABNORMAL HIGH (ref 70–99)
Glucose-Capillary: 138 mg/dL — ABNORMAL HIGH (ref 70–99)
Glucose-Capillary: 161 mg/dL — ABNORMAL HIGH (ref 70–99)
Glucose-Capillary: 114 mg/dL — ABNORMAL HIGH (ref 70–99)

## 2020-07-18 LAB — COMPREHENSIVE METABOLIC PANEL
ALT: 18 U/L (ref 0–44)
AST: 27 U/L (ref 15–41)
Albumin: 2.8 g/dL — ABNORMAL LOW (ref 3.5–5.0)
Alkaline Phosphatase: 40 U/L (ref 38–126)
Anion gap: 8 (ref 5–15)
BUN: 23 mg/dL (ref 8–23)
CO2: 22 mmol/L (ref 22–32)
Calcium: 8.4 mg/dL — ABNORMAL LOW (ref 8.9–10.3)
Chloride: 104 mmol/L (ref 98–111)
Creatinine, Ser: 1.42 mg/dL — ABNORMAL HIGH (ref 0.61–1.24)
GFR calc Af Amer: 58 mL/min — ABNORMAL LOW (ref >60)
GFR calc non Af Amer: 50 mL/min — ABNORMAL LOW (ref >60)
Glucose, Bld: 183 mg/dL — ABNORMAL HIGH (ref 70–99)
Potassium: 4.1 mmol/L (ref 3.5–5.1)
Sodium: 134 mmol/L — ABNORMAL LOW (ref 135–145)
Total Bilirubin: 0.5 mg/dL (ref 0.3–1.2)
Total Protein: 5.1 g/dL — ABNORMAL LOW (ref 6.5–8.1)

## 2020-07-18 LAB — MRSA PCR SCREENING: MRSA by PCR: NEGATIVE

## 2020-07-18 LAB — MAGNESIUM: Magnesium: 1.5 mg/dL — ABNORMAL LOW (ref 1.7–2.4)

## 2020-07-18 MED ORDER — NICOTINE 14 MG/24HR TD PT24
14.0000 mg | MEDICATED_PATCH | Freq: Every day | TRANSDERMAL | Status: DC
  Administered 2020-07-18: 14 mg via TRANSDERMAL
  Filled 2020-07-18: qty 1

## 2020-07-18 MED ORDER — SODIUM CHLORIDE 0.9% FLUSH
10.0000 mL | Freq: Three times a day (TID) | INTRAVENOUS | Status: DC
  Administered 2020-07-18 – 2020-07-24 (×15): 10 mL

## 2020-07-18 MED ORDER — ENOXAPARIN SODIUM 40 MG/0.4ML ~~LOC~~ SOLN
40.0000 mg | SUBCUTANEOUS | Status: DC
  Administered 2020-07-19 – 2020-07-23 (×5): 40 mg via SUBCUTANEOUS
  Filled 2020-07-18 (×5): qty 0.4

## 2020-07-18 MED ORDER — HYDROMORPHONE HCL 1 MG/ML IJ SOLN: 0.5000 mg | INTRAMUSCULAR | Status: DC | PRN

## 2020-07-18 MED ORDER — ALBUMIN HUMAN 5 % IV SOLN
12.5000 g | Freq: Four times a day (QID) | INTRAVENOUS | Status: DC | PRN
  Filled 2020-07-18: qty 250

## 2020-07-18 MED ORDER — ALBUTEROL SULFATE (2.5 MG/3ML) 0.083% IN NEBU
3.0000 mL | INHALATION_SOLUTION | RESPIRATORY_TRACT | Status: DC
  Administered 2020-07-18 – 2020-07-19 (×4): 3 mL via RESPIRATORY_TRACT
  Filled 2020-07-18 (×3): qty 3

## 2020-07-18 MED ORDER — CHLORHEXIDINE GLUCONATE CLOTH 2 % EX PADS
6.0000 | MEDICATED_PAD | Freq: Every day | CUTANEOUS | Status: DC
  Administered 2020-07-18 – 2020-07-22 (×4): 6 via TOPICAL

## 2020-07-18 MED ORDER — LACTATED RINGERS IV SOLN: INTRAVENOUS | Status: DC

## 2020-07-18 MED ORDER — INSULIN ASPART 100 UNIT/ML ~~LOC~~ SOLN
0.0000 [IU] | SUBCUTANEOUS | Status: DC
  Administered 2020-07-18: 1 [IU] via SUBCUTANEOUS
  Administered 2020-07-18 – 2020-07-19 (×3): 2 [IU] via SUBCUTANEOUS
  Administered 2020-07-19: 3 [IU] via SUBCUTANEOUS
  Administered 2020-07-21 – 2020-07-23 (×3): 1 [IU] via SUBCUTANEOUS
  Administered 2020-07-23: 2 [IU] via SUBCUTANEOUS

## 2020-07-18 NOTE — Progress Notes (Addendum)
1 Day Post-Op    CC: Large adenomatous polyp  Subjective: Patient's postop day 1.  He was initially noted that he was not moving much on incentive spirometry by the nursing staff.  He then dropped his O2 sats.  He did not really complain of any shortness of breath.  The Rapid Response Nurse saw the patient.  Blood gases and chest x-ray were obtained.  PO2 was down to 42% and he had a right-sided tension pneumothorax.  He was seen by [place], and Dr. Vaughan Browner has just placed a right-sided chest tube.  Post chest tube placement x-ray is pending. From a surgical standpoint he is doing well.  No nausea or vomiting with the clear liquids he had yesterday.  No flatus, no BM, bowel sounds are hypoactive.  He also has a large hematoma on his left hip.  Objective: Vital signs in last 24 hours: Temp:  [97.5 F (36.4 C)-98.6 F (37 C)] 97.8 F (36.6 C) (08/12 1130) Pulse Rate:  [62-119] 119 (08/12 1130) Resp:  [14-26] 20 (08/12 1130) BP: (82-165)/(66-99) 112/67 (08/12 1130) SpO2:  [78 %-100 %] 78 % (08/12 1128) Last BM Date: 07/17/20 720 p.o. recorded 2500 IV 1300 urine No BM recorded Afebrile, tachycardic, blood pressure stable.  O2 sats dropped to 86% this a.m. placed on high flow nasal cannula. pH 7.141, PCO2 31.9, PO2 42.4, bicarb 20.3.  On room air BMP shows creatinine of 1.56, glucose of 222, magnesium of 1.5, WBC 17.7, H/H 10.1/30.4, platelets 202,000. Chest x-ray shows a right-sided tension pneumothorax   Intake/Output from previous day: 08/11 0701 - 08/12 0700 In: 3215.8 [P.O.:720; I.V.:2495.8] Out: 1350 [Urine:1300; Blood:50] Intake/Output this shift: Total I/O In: 240 [P.O.:240] Out: 0   General appearance: alert, cooperative and anxious with CT being placed at the time I arrived Resp: clear to auscultation bilaterally and 1 appears to be up his breath sounds apex on the right left.  Still bubbling some from the right chest tube.  Suction placed at 40 GI:  Abdomen soft port sites all look good.  No flatus or BM.  He has a large left hip hematoma. Extremities: extremities normal, atraumatic, no cyanosis or edema  Lab Results:  Recent Labs    07/18/20 0502 07/18/20 1206  WBC 17.7* 18.9*  HGB 10.1* 9.3*  HCT 30.4* 27.8*  PLT 202 227    BMET Recent Labs    07/17/20 1733 07/18/20 0502  NA 136 135  K 3.5 4.6  CL 104 102  CO2 23 25  GLUCOSE 219* 222*  BUN 6* 14  CREATININE 0.97 1.56*  CALCIUM 8.4* 8.6*   PT/INR No results for input(s): LABPROT, INR in the last 72 hours.  Recent Labs  Lab 07/17/20 1733  AST 32  ALT 18  ALKPHOS 54  BILITOT 0.4  PROT 5.3*  ALBUMIN 3.1*     Lipase  No results found for: LIPASE   Prior to Admission medications   Medication Sig Start Date End Date Taking? Authorizing Provider  albuterol (VENTOLIN HFA) 108 (90 Base) MCG/ACT inhaler Inhale 1-2 puffs into the lungs every 6 (six) hours as needed for wheezing or shortness of breath.   Yes [provider]  traMADol (ULTRAM) 50 MG tablet Take 1-2 tablets (50-100 mg total) by mouth every 6 (six) hours as needed for moderate pain or severe pain. 07/17/20   Michael Boston, MD    Medications: . acetaminophen  1,000 mg Oral Q6H  . alvimopan  12 mg Oral BID  .  Chlorhexidine Gluconate Cloth  6 each Topical Daily  . enoxaparin (LOVENOX) injection  40 mg Subcutaneous Q24H  . feeding supplement  237 mL Oral BID BM  . folic acid  1 mg Oral Daily  . gabapentin  200 mg Oral TID  . lip balm  1 application Topical BID  . multivitamin with minerals  1 tablet Oral Daily  . nicotine  14 mg Transdermal Daily  . psyllium  1 packet Oral BID  . sodium chloride flush  3 mL Intravenous Q12H  . thiamine  100 mg Oral Daily   Or  . thiamine  100 mg Intravenous Daily   . sodium chloride    . albumin human    . lactated ringers    . methocarbamol (ROBAXIN) IV      Assessment/Plan  Hx adenomatous polyp the cecum, Tobacco abuse Hx of heavy ETOH use  until 3 months ago, 1/5 vodka >> a few beers per day/ some tremors wth decrease in ETOH at home Hx PUD/perforated duodenal ulcer 05/1992 Hematoma left hip  - H/H 14.6/43.9>> 12.5/38>> 10.1/30.4>> 9.3/27.8  Cecal colon polyp unresectable by colonoscopy Robotic proximal hemicolectomy, robotic lysis of adhesions x60 minutes, bilateral tap block, 07/17/2020 Dr. Michael Boston POD #1 Spontaneous right pneumothorax  -Right chest tube insertion,  07/18/2020. Dr. Vaughan Browner CCM FEN: IV fluids/clear liquids ID: Cefotetan 8/11 >> day 2 DVT: Lovenox Follow-up: Dr. Johney Maine    Plan: Hold Lovenox for today.  I told her to go slow on clear liquids.  Chest x-ray, CBC and CMP in the morning.  He is on the CIWA protocol.  I did decrease his as needed Dilaudid slightly.  IV at 75cc/hr for renal function.   Post CT placement film: Near complete resolution of right pneumothorax after chest tube placement. The patient is somewhat rotated on the study but there is persistent shift of the mediastinal contents to the left which may be chronic. Persistent left basilar airspace disease which may be due to atelectasis given mediastinal shift.  Repeat labs and  CXR ordered for the AM.   LOS: 1 day    Timothy Shaffer 07/18/2020 Please see Amion

## 2020-07-18 NOTE — Progress Notes (Signed)
[facility] Progress Note Patient Name: Timothy Shaffer DOB: 01/07/1951 MRN: [id number]   Date of Service  07/18/2020  HPI/Events of Note  Patient with acute respiratory decompensation to a saturation of 60, stat portable CXR ordered, ED physician requested to come up to patient's room in case chest tube insertion required, and PCCM bedside also requested to come and evaluate patient. Anesthesia requested to standby incase intubation was required.  eICU Interventions  See above.        [name] Husband 07/18/2020, 11:18 PM

## 2020-07-18 NOTE — Progress Notes (Signed)
Dr. Johney Maine paged in regards to patients low O2 sat of 80 and HR of 114. Yellows MEWS initated. Respiratory paged to administer breathing tx. Patient does not c/o trouble breathing, SOB, chest pain. Patient is AO and playing on his phone. IS achieved was 500 with multiple attempts. Patient is on continuous pulse ox currently. Awaiting new orders.

## 2020-07-18 NOTE — Progress Notes (Signed)
Standing order fluid bolus infusing currently for BP 82/66, Patient states he feels ok, not lightheaded or dizzy while in bed, will attempt to walk when bolus is completed and BP rechecked.

## 2020-07-18 NOTE — Significant Event (Signed)
Rapid Response Event Note   Reason for Call : Respiratory-Hypoxia O2 Saturations in the 70s-80s, increased WOB, alert x 3, oriented to self, time and place, disoriented to situation. Upon arrival, respiratory therapist, Angie, at bedside. Angie RT placed patient on NRB.    Initial Focused Assessment:  Neuro: Alert and oriented x3, able to follow simple commands  Cardiac: BP WNL, s1 and s2 heard upon ausculation, ST for HR. Pulmonary: Hypoxia noted, placed on NRB. Patient did have increased WOB, breath sounds clear and very diminished.  Skin: large bruise on left hip extending to mid back-Will J. PA with surgery notified   Interventions:  Transfer to ICU/SDU CBC Chest X-ray  ABG    Plan of Care:  Transfer to ICU     Event Summary:   MD Notified: MD Johney Maine and MD Mannem notified of tension pneumothorax after patient arrived to ICU/SD  Call Time: 1222 -MD Mannem  Arrival Time: 1222 MD in ICU when patient arrived  End Time: [place], RN

## 2020-07-18 NOTE — Progress Notes (Signed)
Patient's O2 is consistently running 78-81%. HFNC on @ 15. Patient slightly tachypneic but does not express c/o trouble breathing. Vitals obtained. RR at bedside. Patient will transfer to stepdown per Dr. Johney Maine.

## 2020-07-18 NOTE — Progress Notes (Addendum)
Called by [employer] floor nurse.  After in seeing him, patient became more tachypneic and hypoxic.  No improvement with oxygenation and nebulizers.  Rapid response team called.  X-ray reveals right-sided tension pneumothorax.  Patient had no lines.  Most likely due to spontaneously rupturing bleb from his emphysema and smoking.  D/w Dr. Vaughan Browner with pulmonary critical care.  I am not physically available in the hospital but they are placing a pigtail chest tube to help decompress.  I am reaching out to my partners to see if they can visit him sooner until I can return the hospital in a few hours.  PA to come by as well  Critical care & Nursing help greatly appreciated.  Adin Hector, MD, FACS, MASCRS Gastrointestinal and Minimally Invasive Surgery  [facility] [address], [place], [place] [address] [phone] Fax [phone] Main/Paging  CONTACT INFORMATION: Weekday (9AM-5PM) concerns: Call [facility] main office at [phone] Weeknight (5PM-9AM) or Weekend/Holiday concerns: Check [email or web address] for General Surgery [facility] coverage (Please, do not use SecureChat as it is not reliable communication to operating surgeons for immediate patient care)

## 2020-07-18 NOTE — Progress Notes (Signed)
Note: Portions of this report may have been transcribed using voice recognition software. A sincere effort was made to ensure accuracy; however, inadvertent computerized transcription errors may be present.   Any transcriptional errors that result from this process are unintentional.        Timothy Shaffer  12-Sep-1951 [id number]  Patient Care Team: Aura Dials, MD as PCP - General (Family Medicine) Michael Boston, MD as Consulting Physician (General Surgery) Wilford Corner, MD as Consulting Physician (Gastroenterology) Deliah Goody, PA-C as Physician Assistant (Family Medicine)  Events noted.  Nursing and critical care help greatly appreciated.  Discussed with critical care nurse at bedside and Dr. Concepcion Living  Patient sleeping in room but wakes up.  Critical care nurse at bedside.  Patient notes he is breathing better.  Still somewhat hypoxic with sats much better and back down to nasal cannula.  Coarse breath sounds on right side  Some ecchymosis noted on left flank but relatively stable.  Tolerating clear liquids out nausea.  No flatus or bowel movements.  Chest x-ray shows pneumothorax nearly resolved with pigtail chest tube placement.  No urine output since Foley cath removed.  Discussed with group wound nurse provider every 8 hours..  If very low or high volume or needs to be catheterized second time, leave Foley in for 72 hours.  Creatinine is improved to 1.4, so I hesitate to replace just yet.  Normally I would diurese but I wish to hold off at this point with elevated creatinine.  No evidence of any major edema on x-ray, just atelectasis.  Keep him on the dry side with as needed fluids to catch up if needed.  We will keep in stepdown unit at least today.  Try to improve pain control.  Alcohol withdrawal protocol.  Challenge in this patient with spontaneous tension pneumothorax and heavy alcohol use and chronic pain issues.  Follow closely.  Adin Hector, MD, FACS,  MASCRS Gastrointestinal and Minimally Invasive Surgery  [facility] [address] [place], [place] [address] [phone] Fax [phone] Main/Paging  CONTACT INFORMATION: Weekday (9AM-5PM) concerns: Call [facility] main office at [phone] Weeknight (5PM-9AM) or Weekend/Holiday concerns: Check [email or web address] for General Surgery [facility] coverage (Please, do not use SecureChat as it is not reliable communication to operating surgeons for immediate patient care)       Patient Active Problem List   Diagnosis Date Noted  . Mass of cecum 07/17/2020  . 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 History of adenomatous polyp of colon 05/21/2020  . Tobacco abuse 05/21/2020  . Moderate alcohol consumption 05/21/2020  . PUD (peptic ulcer disease)   . Perforated duodenal ulcer ([place]) 05/21/1992    Past Medical History:  Diagnosis Date  . Asthma    allergies  . Complication of anesthesia   . Family history of adverse reaction to anesthesia    PONV  . PONV (postoperative nausea and vomiting)   . PUD (peptic ulcer disease)     Past Surgical History:  Procedure Laterality Date  . COLONOSCOPY  04/2020  . MASS EXCISION Left 04/24/2014   Procedure: LEFT LONG & RING FINGERS DISTAL INTERPHALANGEAL JOINT/CYST EXCISION;  Surgeon: Cammie Sickle., MD;  Location: [facility];  Service: Orthopedics;  Laterality: Left;  . REPAIR OF PERFORATED ULCER  1993   [facility], [place], [place]    Social History   Socioeconomic History  . Marital status: Married    Spouse name: Not on file  .  Number of children: Not on file  . Years of education: Not on file  . Highest education level: Not on file  Occupational History  . Not on file  Tobacco Use  . Smoking status: Current Every Day Smoker    Packs/day: 1.00    Years: 15.00    Pack years: 15.00    Types: Cigarettes  . Smokeless tobacco: Never Used  Vaping Use  . Vaping Use:  Never used  Substance and Sexual Activity  . Alcohol use: Yes    Alcohol/week: 12.0 standard drinks    Types: 12 Cans of beer per week  . Drug use: Yes    Types: Marijuana  . Sexual activity: Not on file  Other Topics Concern  . Not on file  Social History Narrative  . Not on file   Social Determinants of Health   Financial Resource Strain:   . Difficulty of Paying Living Expenses:   Food Insecurity:   . Worried About Charity fundraiser in the Last Year:   . Arboriculturist in the Last Year:   Transportation Needs:   . Film/video editor (Medical):   Marland Kitchen Lack of Transportation (Non-Medical):   Physical Activity:   . Days of Exercise per Week:   . Minutes of Exercise per Session:   Stress:   . Feeling of Stress :   Social Connections:   . Frequency of Communication with Friends and Family:   . Frequency of Social Gatherings with Friends and Family:   . Attends Religious Services:   . Active Member of Clubs or Organizations:   . Attends Archivist Meetings:   Marland Kitchen Marital Status:   Intimate Partner Violence:   . Fear of Current or Ex-Partner:   . Emotionally Abused:   Marland Kitchen Physically Abused:   . Sexually Abused:     History reviewed. No pertinent family history.  Current Facility-Administered Medications  Medication Dose Route Frequency Provider Last Rate Last Admin  . 0.9 %  sodium chloride infusion  250 mL Intravenous PRN Michael Boston, MD      . acetaminophen (TYLENOL) tablet 1,000 mg  1,000 mg Oral Q6H Michael Boston, MD   1,000 mg at 07/18/20 1110  . albumin human 5 % solution 12.5 g  12.5 g Intravenous Q6H PRN Michael Boston, MD      . albuterol (PROVENTIL) (2.5 MG/3ML) 0.083% nebulizer solution 3 mL  3 mL Inhalation Q6H PRN Michael Boston, MD      . alum & mag hydroxide-simeth (MAALOX/MYLANTA) 200-200-20 MG/5ML suspension 30 mL  30 mL Oral Q6H PRN Michael Boston, MD      . alvimopan (ENTEREG) capsule 12 mg  12 mg Oral BID Michael Boston, MD   12 mg at  07/18/20 0846  . Chlorhexidine Gluconate Cloth 2 % PADS 6 each  6 each Topical Daily Mannam, Praveen, MD   6 each at 07/18/20 1300  . diphenhydrAMINE (BENADRYL) 12.5 MG/5ML elixir 12.5 mg  12.5 mg Oral Q6H PRN Michael Boston, MD       Or  . diphenhydrAMINE (BENADRYL) injection 12.5 mg  12.5 mg Intravenous Q6H PRN Michael Boston, MD      . Derrill Memo ON 07/19/2020] enoxaparin (LOVENOX) injection 40 mg  40 mg Subcutaneous Q24H Earnstine Regal, PA-C      . feeding supplement (ENSURE SURGERY) liquid 237 mL  237 mL Oral BID BM Michael Boston, MD   237 mL at 07/18/20 0852  . folic acid (FOLVITE) tablet 1  mg  1 mg Oral Daily Michael Boston, MD   1 mg at 07/18/20 0847  . gabapentin (NEURONTIN) capsule 200 mg  200 mg Oral TID Michael Boston, MD   200 mg at 07/18/20 0847  . HYDROmorphone (DILAUDID) injection 0.5-1.5 mg  0.5-1.5 mg Intravenous Q4H PRN Earnstine Regal, PA-C      . insulin aspart (novoLOG) injection 0-9 Units  0-9 Units Subcutaneous Q4H Erick Colace, NP   2 Units at 07/18/20 1408  . lactated ringers infusion   Intravenous Continuous Michael Boston, MD 75 mL/hr at 07/18/20 1354 Rate Change at 07/18/20 1354  . lip balm (CARMEX) ointment 1 application  1 application Topical BID Michael Boston, MD   1 application at 03/00/92 [phone]  . LORazepam (ATIVAN) tablet 1-4 mg  1-4 mg Oral Q1H PRN Michael Boston, MD   1 mg at 07/18/20 0552   Or  . LORazepam (ATIVAN) injection 1-4 mg  1-4 mg Intravenous Q1H PRN Michael Boston, MD   1 mg at 07/18/20 1359  . magic mouthwash  15 mL Oral QID PRN Michael Boston, MD      . methocarbamol (ROBAXIN) 1,000 mg in dextrose 5 % 100 mL IVPB  1,000 mg Intravenous Q6H PRN Michael Boston, MD      . metoprolol tartrate (LOPRESSOR) injection 5 mg  5 mg Intravenous Q6H PRN Michael Boston, MD      . multivitamin with minerals tablet 1 tablet  1 tablet Oral Daily Michael Boston, MD   1 tablet at 07/18/20 0847  . nicotine (NICODERM CQ - dosed in mg/24 hours) patch 14 mg  14 mg Transdermal  Daily Michael Boston, MD   14 mg at 07/18/20 1353  . ondansetron (ZOFRAN) tablet 4 mg  4 mg Oral Q6H PRN Michael Boston, MD       Or  . ondansetron [facility]) injection 4 mg  4 mg Intravenous Q6H PRN Michael Boston, MD      . prochlorperazine (COMPAZINE) tablet 10 mg  10 mg Oral Q6H PRN Michael Boston, MD       Or  . prochlorperazine (COMPAZINE) injection 5-10 mg  5-10 mg Intravenous Q6H PRN Michael Boston, MD   10 mg at 07/17/20 2013  . psyllium (HYDROCIL/METAMUCIL) packet 1 packet  1 packet Oral BID Michael Boston, MD   1 packet at 07/17/20 2304  . sodium chloride flush (NS) 0.9 % injection 10 mL  10 mL Intracatheter Q8H Erick Colace, NP   10 mL at 07/18/20 1354  . sodium chloride flush (NS) 0.9 % injection 3 mL  3 mL Intravenous Gorden Harms, MD   3 mL at 07/17/20 2200  . sodium chloride flush (NS) 0.9 % injection 3 mL  3 mL Intravenous PRN Michael Boston, MD      . thiamine tablet 100 mg  100 mg Oral Daily Michael Boston, MD   100 mg at 07/18/20 0847   Or  . thiamine (B-1) injection 100 mg  100 mg Intravenous Daily Michael Boston, MD   100 mg at 07/17/20 2016  . traMADol (ULTRAM) tablet 50 mg  50 mg Oral Q6H PRN Michael Boston, MD         Allergies  Allergen Reactions  . Nsaids     History of perforated ulcer    BP 130/85   Pulse (!) 104   Temp 97.8 F (36.6 C) (Oral)   Resp (!) 22   Ht 5\' 11"  (1.803 m)   Wt 58.1 kg  SpO2 92%   BMI 17.85 kg/m   DG CHEST PORT 1 VIEW  Result Date: 07/18/2020 CLINICAL DATA:  Status post right chest tube placement for pneumothorax. EXAM: PORTABLE CHEST 1 VIEW COMPARISON:  Single-view of the chest earlier today. FINDINGS: Pigtail catheter is now in place in right chest. Right pneumothorax has almost completely resolved. Only small apical pneumothorax is now seen. Volume loss in the left chest and left basilar airspace disease are again seen. There is shift of the mediastinal contents to the left which is unchanged. The patient is rotated to the  right on the study IMPRESSION: Near complete resolution of right pneumothorax after chest tube placement. The patient is somewhat rotated on the study but there is persistent shift of the mediastinal contents to the left which may be chronic. Persistent left basilar airspace disease which may be due to atelectasis given mediastinal shift. Electronically Signed   By: Inge Rise M.D.   On: 07/18/2020 13:37   DG Chest Port 1 View  Result Date: 07/18/2020 CLINICAL DATA:  Respiratory distress. History of recent robotic colectomy. EXAM: PORTABLE CHEST 1 VIEW COMPARISON:  None. FINDINGS: There is a moderate-sized right-sided tension pneumothorax. The pneumothorax is estimated at 25%. There is shift of the heart and mediastinum to the left including tracheal deviation. Associated loss of volume in the left lung with areas of atelectasis. IMPRESSION: Right-sided tension pneumothorax as above. These results were called by telephone at the time of interpretation on 07/18/2020 at 12:11 pm to the patient's nurse, Caryl Pina, who verbally acknowledged these results. Electronically Signed   By: Marijo Sanes M.D.   On: 07/18/2020 12:13   Adin Hector, MD, FACS, MASCRS Gastrointestinal and Minimally Invasive Surgery  [facility] [address] [place], [place] [address] [phone] Fax [phone] Main/Paging  CONTACT INFORMATION: Weekday (9AM-5PM) concerns: Call [facility] main office at [phone] Weeknight (5PM-9AM) or Weekend/Holiday concerns: Check [email or web address] for General Surgery [facility] coverage (Please, do not use SecureChat as it is not reliable communication to operating surgeons for immediate patient care)

## 2020-07-18 NOTE — Plan of Care (Signed)

## 2020-07-18 NOTE — Consult Note (Signed)
NAME:  Timothy Shaffer, MRN:  [id number], DOB:  1951/08/04, LOS: 1 ADMISSION DATE:  07/17/2020, CONSULTATION DATE:  8/12 REFERRING MD:  Johney Maine, CHIEF COMPLAINT:  Tension PTX    Brief History   69 year old male admitted 8/11 for robotic colectomy. On 8/12 developed acute hypoxia in setting of spontaneous tension right PTX.   History of present illness   69 year old male admitted 8/11 for elective robotic proximal colectomy. Surgery was completed w/out complication on 4/09.  -on 8/12 developed rather acute hypoxia sats 80% HR 114, placed on high flow oxygen. Interestingly did not report dyspnea.  A CXR was obtained this showed large right sided tension PTX   -->critical care asked to assist w/ care   Past Medical History  Colon polyp, tobacco abuse, copd, etoh abuse prior perf duodenal ulcer   Significant Hospital Events   8/11 admitted -underwent robotic colectomy  8/12 spont right PTX.   Consults:  PCCM 8/12 Procedures:  Right small bore chest tube 8/12>>  Significant Diagnostic Tests:    Micro Data:   Antimicrobials:    Interim history/subjective:  Requiring high flow oxygen   Objective   Blood pressure 112/67, pulse (Abnormal) 119, temperature 97.8 F (36.6 C), temperature source Oral, resp. rate 20, height 5\' 11"  (1.803 m), weight 58.1 kg, SpO2 (Abnormal) 78 %.        Intake/Output Summary (Last 24 hours) at 07/18/2020 1234 Last data filed at 07/18/2020 1000 Gross per 24 hour  Intake 3455.8 ml  Output 1350 ml  Net 2105.8 ml   Filed Weights   07/17/20 1211  Weight: 58.1 kg    Examination: General: chronically ill appearing 69 year old male lying in bed HENT: NCAT no JVD  Lungs: decreased on right  Cardiovascular: RRR  Abdomen: soft surgical site tender   Extremities: warm and dry  Neuro: awake oriented  GU: voids   Resolved Hospital Problem list    Assessment & Plan:  spontaneous right tension pneumothorax.  -now s/p right chest tube Plan Ct to 20  cm sxn  Daily cxr Prn analgesia  Consider CT chest (better evaluate underlying lung parenchyma)   S/p robotic hemicolectomy Plan Per surg team   Mild AKI Plan  MIVFs Renal dose meds Am chem  Hyperglycemia  Plan ssi  Mild anemia  Plan  Monitor   Leukocytosis  Plan Trend cbc and fever curve    Best practice:  Diet: cl Pain/Anxiety/Delirium protocol (if indicated): na VAP protocol (if indicated): na DVT prophylaxis: scd GI prophylaxis: ppi Glucose control: ssi Mobility: inc as tol  Code Status: full code  Family Communication: per surg  Disposition: sdu  Labs   CBC: Recent Labs  Lab 07/17/20 1733 07/18/20 0502 07/18/20 1206  WBC 18.8* 17.7* 18.9*  HGB 12.5* 10.1* 9.3*  HCT 38.0* 30.4* 27.8*  MCV 110.5* 109.7* 109.9*  PLT 211 202 811    Basic Metabolic Panel: Recent Labs  Lab 07/17/20 1733 07/18/20 0502  NA 136 135  K 3.5 4.6  CL 104 102  CO2 23 25  GLUCOSE 219* 222*  BUN 6* 14  CREATININE 0.97 1.56*  CALCIUM 8.4* 8.6*  MG 1.6* 1.5*  PHOS 3.1  --    GFR: Estimated Creatinine Clearance: 36.7 mL/min (A) (by C-G formula based on SCr of 1.56 mg/dL (H)). Recent Labs  Lab 07/17/20 1733 07/18/20 0502 07/18/20 1206  WBC 18.8* 17.7* 18.9*    Liver Function Tests: Recent Labs  Lab 07/17/20 1733  AST  32  ALT 18  ALKPHOS 54  BILITOT 0.4  PROT 5.3*  ALBUMIN 3.1*   No results for input(s): LIPASE, AMYLASE in the last 168 hours. No results for input(s): AMMONIA in the last 168 hours.  ABG    Component Value Date/Time   PHART 7.417 07/18/2020 1136   PCO2ART 31.9 (L) 07/18/2020 1136   PO2ART 42.4 (L) 07/18/2020 1136   HCO3 20.3 07/18/2020 1136   ACIDBASEDEF 3.3 (H) 07/18/2020 1136   O2SAT 77.4 07/18/2020 1136     Coagulation Profile: No results for input(s): INR, PROTIME in the last 168 hours.  Cardiac Enzymes: No results for input(s): CKTOTAL, CKMB, CKMBINDEX, TROPONINI in the last 168 hours.  HbA1C: Hgb A1c MFr Bld    Date/Time Value Ref Range Status  07/08/2020 12:01 PM 5.0 4.8 - 5.6 % Final    Comment:    (NOTE) Pre diabetes:          5.7%-6.4%  Diabetes:              >6.4%  Glycemic control for   <7.0% adults with diabetes     CBG: No results for input(s): GLUCAP in the last 168 hours.  Review of Systems:   No cp   Past Medical History  He,  has a past medical history of Asthma, Complication of anesthesia, Family history of adverse reaction to anesthesia, PONV (postoperative nausea and vomiting), and PUD (peptic ulcer disease).   Surgical History    Past Surgical History:  Procedure Laterality Date   COLONOSCOPY  04/2020   MASS EXCISION Left 04/24/2014   Procedure: LEFT LONG & RING FINGERS DISTAL INTERPHALANGEAL JOINT/CYST EXCISION;  Surgeon: Cammie Sickle., MD;  Location: [facility];  Service: Orthopedics;  Laterality: Left;   REPAIR OF PERFORATED ULCER  1993   MCV, [place], [place] History   reports that he has been smoking cigarettes. He has a 15.00 pack-year smoking history. He has never used smokeless tobacco. He reports current alcohol use of about 12.0 standard drinks of alcohol per week. He reports current drug use. Drug: Marijuana.   Family History   His family history is not on file.   Allergies Allergies  Allergen Reactions   Nsaids     History of perforated ulcer     Home Medications  Prior to Admission medications   Medication Sig Start Date End Date Taking? Authorizing Provider  albuterol (VENTOLIN HFA) 108 (90 Base) MCG/ACT inhaler Inhale 1-2 puffs into the lungs every 6 (six) hours as needed for wheezing or shortness of breath.   Yes [provider]  traMADol (ULTRAM) 50 MG tablet Take 1-2 tablets (50-100 mg total) by mouth every 6 (six) hours as needed for moderate pain or severe pain. 07/17/20   Michael Boston, MD     Critical care time: na     Erick Colace ACNP-BC [place] Pager #  [phone] OR # [phone] if no answer

## 2020-07-18 NOTE — Consult Note (Signed)
[facility] Nurse wound follow up: Pt is followed by the surgical team for assessment and plan of care.  Pt did not receive an ostomy yesterday during surgery.  No further role for WOC team.  Please re-consult if further assistance is needed.  Thank-you,  Julien Girt MSN, [place], [place], [place], [place]

## 2020-07-18 NOTE — Plan of Care (Addendum)
Called for acute respiratory decompensation. Desaturation suddendly to 60s, improved to ~90% by the time I was to bedside. ED physician had already flushed chest tube, which does not have air leak. Tube flushed a second time proximally and distally.  BP (!) 102/56   Pulse 100   Temp 98.2 F (36.8 C) (Oral)   Resp (!) 29   Ht 5\' 11"  (1.803 m)   Wt 58.1 kg   SpO2 92%   BMI 17.85 kg/m  Rhonchi on the left, + breath sounds on the R.  Very weak, wet sounding cough Saturations as high as 95%  CXR with increased retrocardiac opacification, persistent leftward mediastinal shift, no pneumothorax on R. Chest tube remains in place.  Plan: CPT & albuterol Q4h NT suction PRN  Julian Hy, DO 07/18/20 9:12 PM [facility]

## 2020-07-18 NOTE — Progress Notes (Signed)
Timothy Shaffer [id number] [birth year]  CARE TEAM:  PCP: Aura Dials, MD  Outpatient Care Team: Patient Care Team: Aura Dials, MD as PCP - General (Family Medicine) Michael Boston, MD as Consulting Physician (General Surgery) Wilford Corner, MD as Consulting Physician (Gastroenterology) Deliah Goody, PA-C as Physician Assistant (Family Medicine)  Inpatient Treatment Team: Treatment Team: Attending Provider: Michael Boston, MD; Technician: Ernest Mallick, NT; Registered Nurse: Blase Mess, RN; Utilization Review: Tressie Stalker, RN; Registered Nurse: Donia Ast, RN; Social Worker: Lia Hopping, LCSW   Problem List:   Principal Problem:   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  As needed boluses.  We will try albumin. CIWA alcohol withdrawal protocol.  Folate and thiamine.  Multivitamin.  Follow-up on pathology.  Try and mobilize.  Last physical and Occupational Therapy to evaluate to see if he needs home health or skilled facility.  Mildly elevated creatinine but not oliguric.  Should stabilize.  Follow.  VTE prophylaxis- SCDs, etc  mobilize as tolerated to help recovery  25 minutes spent in review, evaluation, examination, counseling, and coordination of care.  More than 50% of that time was spent in counseling.  07/18/2020    Subjective: (Chief complaint)  Feeling little worn out and tired.  Tolerated  liquids and advanced.  Diet.  No flatus.  Able to get in chair but did not feel comfortable enough to walk.  Some soreness but pain controlled with medications.  Objective:  Vital signs:  Vitals:   07/18/20 0150 07/18/20 0544 07/18/20 0639 07/18/20 1014  BP: (!) 82/66  114/74 114/71  Pulse: 93 98 96 (!) 112  Resp: 17  16 18   Temp: 98.2 F (36.8 C)  98.1 F (36.7 C) 98 F (36.7 C)  TempSrc:    Oral  SpO2: 100%  99% (!) 86%  Weight:      Height:        Last BM Date: 07/17/20  Intake/Output   Yesterday:  08/11 0701 - 08/12 0700 In: 3215.8 [P.O.:720; I.V.:2495.8] Out: 1350 [Urine:1300; Blood:50] This shift:  No intake/output data recorded.  Bowel function:  Flatus: No  BM:  No  Drain: (No drain)   Physical Exam:  General: Pt awake/alert in no acute distress.  Tired but not toxic Eyes: PERRL, normal EOM.  Sclera clear.  No icterus Neuro: CN II-XII intact w/o focal sensory/motor deficits. Lymph: No head/neck/groin lymphadenopathy Psych:  No delerium/psychosis/paranoia.  Oriented x 4 HENT: Normocephalic, Mucus membranes moist.  No thrush Neck: Supple, No tracheal deviation.  No obvious thyromegaly Chest: No pain to chest wall compression.  Good respiratory excursion.  No audible wheezing CV:  Pulses intact.  Regular rhythm.  No major extremity edema MS: Normal AROM mjr joints.  No obvious deformity  Abdomen: Soft.  Mildy distended.  Mildly tender at incisions only.  No evidence of peritonitis.  No incarcerated hernias.  Ext:  No deformity.  No mjr edema.  No cyanosis Skin: No petechiae /  purpurea.  No major sores.  Warm and dry    Results:   Cultures: Recent Results (from the past 720 hour(s))  SARS CORONAVIRUS 2 (TAT 6-24 HRS) Nasopharyngeal Nasopharyngeal Swab     Status: None   Collection Time: 07/13/20 10:15 AM   Specimen: Nasopharyngeal Swab  Result Value Ref Range Status   SARS Coronavirus 2 NEGATIVE NEGATIVE Final    Comment: (NOTE) SARS-CoV-2  target nucleic acids are NOT DETECTED.  The SARS-CoV-2 RNA is generally detectable in upper and lower respiratory specimens during the acute phase of infection. Negative results do not preclude SARS-CoV-2 infection, do not rule out co-infections with other pathogens, and should not be used as the sole basis for treatment or other patient management decisions. Negative results must be combined with clinical observations, patient history, and epidemiological information. The expected result is Negative.  Fact Sheet for Patients: SugarRoll.be  Fact Sheet for Healthcare Providers: https://www.woods-mathews.com/  This test is not yet approved or cleared by the [place] FDA and  has been authorized for detection and/or diagnosis of SARS-CoV-2 by FDA under an Emergency Use Authorization (EUA). This EUA will remain  in effect (meaning this test can be used) for the duration of the COVID-19 declaration under Se ction 564(b)(1) of the Act, 21 U.S.C. section 360bbb-3(b)(1), unless the authorization is terminated or revoked sooner.  Performed at [facility], [place] [address] [place], [place] [address]     Labs: Results for orders placed or performed during the hospital encounter of 07/17/20 (from the past 48 hour(s))  ABO/Rh     Status: None   Collection Time: 07/17/20 12:45 PM  Result Value Ref Range   ABO/RH(D)      A POS Performed at [facility], [place] [address] [place], [place] [address]   Comprehensive metabolic panel     Status: Abnormal   Collection Time: 07/17/20  5:33 PM  Result Value Ref Range   Sodium 136 135 - 145 mmol/L   Potassium 3.5 3.5 - 5.1 mmol/L   Chloride 104 98 - 111 mmol/L   CO2 23 22 - 32 mmol/L   Glucose, Bld 219 (H) 70 - 99 mg/dL    Comment: Glucose reference range applies only to samples taken after fasting for at least 8 hours.   BUN 6 (L) 8 - 23 mg/dL   Creatinine, Ser 0.97 0.61 -  1.24 mg/dL   Calcium 8.4 (L) 8.9 - 10.3 mg/dL   Total Protein 5.3 (L) 6.5 - 8.1 g/dL   Albumin 3.1 (L) 3.5 - 5.0 g/dL   AST 32 15 - 41 U/L   ALT 18 0 - 44 U/L   Alkaline Phosphatase 54 38 - 126 U/L   Total Bilirubin 0.4 0.3 - 1.2 mg/dL   GFR calc non Af Amer >60 >60 mL/min   GFR calc Af Amer >60 >60 mL/min   Anion gap 9 5 - 15    Comment: Performed at [facility], [place] [address], [place], [place] [address]  Magnesium     Status: Abnormal   Collection Time: 07/17/20  5:33 PM  Result Value Ref Range   Magnesium 1.6 (L) 1.7 - 2.4 mg/dL    Comment: Performed at [facility], [place] [address], [place], [place] [address]  Phosphorus     Status: None   Collection Time: 07/17/20  5:33 PM  Result Value Ref Range   Phosphorus 3.1 2.5 - 4.6 mg/dL    Comment:  Performed at [facility], [place] [address], [place], [place] [address]  CBC     Status: Abnormal   Collection Time: 07/17/20  5:33 PM  Result Value Ref Range   WBC 18.8 (H) 4.0 - 10.5 K/uL   RBC 3.44 (L) 4.22 - 5.81 MIL/uL   Hemoglobin 12.5 (L) 13.0 - 17.0 g/dL   HCT 38.0 (L) 39 - 52 %   MCV 110.5 (H) 80.0 - 100.0 fL   MCH 36.3 (H) 26.0 - 34.0 pg   MCHC 32.9 30.0 - 36.0 g/dL   RDW 12.3 11.5 - 15.5 %   Platelets 211 150 - 400 K/uL   nRBC 0.0 0.0 - 0.2 %    Comment: Performed at [facility], [place] [address] [place], [place] [address]  Basic metabolic panel     Status: Abnormal   Collection Time: 07/18/20  5:02 AM  Result Value Ref Range   Sodium 135 135 - 145 mmol/L   Potassium 4.6 3.5 - 5.1 mmol/L    Comment: DELTA CHECK NOTED NO VISIBLE HEMOLYSIS    Chloride 102 98 - 111 mmol/L   CO2 25 22 - 32 mmol/L   Glucose, Bld 222 (H) 70 - 99 mg/dL    Comment: Glucose reference range applies only to samples taken after fasting for at least 8 hours.   BUN 14 8 - 23 mg/dL   Creatinine, Ser 1.56 (H) 0.61 - 1.24 mg/dL   Calcium 8.6 (L) 8.9 - 10.3 mg/dL   GFR calc  non Af Amer 45 (L) >60 mL/min   GFR calc Af Amer 52 (L) >60 mL/min   Anion gap 8 5 - 15    Comment: Performed at [facility], [place] [address] [place], [place] [address]  CBC     Status: Abnormal   Collection Time: 07/18/20  5:02 AM  Result Value Ref Range   WBC 17.7 (H) 4.0 - 10.5 K/uL   RBC 2.77 (L) 4.22 - 5.81 MIL/uL   Hemoglobin 10.1 (L) 13.0 - 17.0 g/dL   HCT 30.4 (L) 39 - 52 %   MCV 109.7 (H) 80.0 - 100.0 fL   MCH 36.5 (H) 26.0 - 34.0 pg   MCHC 33.2 30.0 - 36.0 g/dL   RDW 12.1 11.5 - 15.5 %   Platelets 202 150 - 400 K/uL   nRBC 0.0 0.0 - 0.2 %    Comment: Performed at [facility], [place] [address], [place], [place] [address]  Magnesium     Status: Abnormal   Collection Time: 07/18/20  5:02 AM  Result Value Ref Range   Magnesium 1.5 (L) 1.7 - 2.4 mg/dL    Comment: Performed at [facility], [place] [address] [place],  [address]    Imaging / Studies: No results found.  Medications / Allergies: per chart  Antibiotics: Anti-infectives (From admission, onward)   Start     Dose/Rate Route Frequency Ordered Stop   07/18/20 0100  cefoTEtan (CEFOTAN) 2 g in sodium chloride 0.9 % 100 mL IVPB        2 g 200 mL/hr over 30 Minutes Intravenous Every 12 hours 07/17/20 1721 07/18/20 1029   07/17/20 1400  neomycin (MYCIFRADIN) tablet 1,000 mg  Status:  Discontinued       "And" Linked Group Details   1,000 mg Oral 3 times per day 07/17/20 1207 07/17/20 1723   07/17/20 1400  metroNIDAZOLE (FLAGYL) tablet 1,000 mg  Status:  Discontinued       "  And" Linked Group Details   1,000 mg Oral 3 times per day 07/17/20 1207 07/17/20 1723   07/17/20 1215  cefoTEtan (CEFOTAN) 2 g in sodium chloride 0.9 % 100 mL IVPB        2 g 200 mL/hr over 30 Minutes Intravenous On call to O.R. 07/17/20 1207 07/17/20 1312        Note: Portions of this report may have been transcribed using voice recognition software. Every effort was made to  ensure accuracy; however, inadvertent computerized transcription errors may be present.   Any transcriptional errors that result from this process are unintentional.    Adin Hector, MD, FACS, MASCRS Gastrointestinal and Minimally Invasive Surgery  [facility] [address], [place], [place] [address] [phone] Fax [phone] Main/Paging  CONTACT INFORMATION: Weekday (9AM-5PM) concerns: Call [facility] main office at [phone] Weeknight (5PM-9AM) or Weekend/Holiday concerns: Check [email or web address] for General Surgery [facility] coverage (Please, do not use SecureChat as it is not reliable communication to operating surgeons for immediate patient care)      07/18/2020  10:31 AM

## 2020-07-18 NOTE — Procedures (Signed)
Insertion of Chest Tube Procedure Note  Mikhi Athey  [id number]  [birth year]  Date:07/18/20  Time:2:02 PM    Provider Performing: Marshell Garfinkel   Procedure: Chest Tube Insertion ([address])  Indication(s) Pneumothorax  Consent Risks of the procedure as well as the alternatives and risks of each were explained to the patient and/or caregiver.  Consent for the procedure was obtained and is signed in the bedside chart  Anesthesia Topical only with 1% lidocaine    Time Out Verified patient identification, verified procedure, site/side was marked, verified correct patient position, special equipment/implants available, medications/allergies/relevant history reviewed, required imaging and test results available.   Sterile Technique Maximal sterile technique including full sterile barrier drape, hand hygiene, sterile gown, sterile gloves, mask, hair covering, sterile ultrasound probe cover (if used).   Procedure Description Ultrasound used to identify appropriate pleural anatomy for placement and overlying skin marked. Area of placement cleaned and draped in sterile fashion.  A  pigtail pleural catheter was placed into the right pleural space using Seldinger technique. Appropriate return of air was obtained.  The tube was connected to atrium and placed on -20 cm H2O wall suction.   Complications/Tolerance None; patient tolerated the procedure well. Chest X-ray is ordered to verify placement.   EBL Minimal  Specimen(s) none   Phillipa Morden MD [facility] Please see [email or web address] for pager details.  07/18/2020, 2:03 PM

## 2020-07-19 ENCOUNTER — Inpatient Hospital Stay (HOSPITAL_COMMUNITY): Payer: Medicare HMO

## 2020-07-19 ENCOUNTER — Encounter (HOSPITAL_COMMUNITY): Payer: Self-pay | Admitting: Surgery

## 2020-07-19 DIAGNOSIS — J930 Spontaneous tension pneumothorax: Secondary | ICD-10-CM

## 2020-07-19 DIAGNOSIS — J9600 Acute respiratory failure, unspecified whether with hypoxia or hypercapnia: Secondary | ICD-10-CM | POA: Diagnosis not present

## 2020-07-19 DIAGNOSIS — J9601 Acute respiratory failure with hypoxia: Secondary | ICD-10-CM | POA: Diagnosis not present

## 2020-07-19 DIAGNOSIS — R739 Hyperglycemia, unspecified: Secondary | ICD-10-CM

## 2020-07-19 DIAGNOSIS — J189 Pneumonia, unspecified organism: Secondary | ICD-10-CM | POA: Diagnosis not present

## 2020-07-19 DIAGNOSIS — D120 Benign neoplasm of cecum: Secondary | ICD-10-CM | POA: Diagnosis not present

## 2020-07-19 LAB — COMPREHENSIVE METABOLIC PANEL
ALT: 16 U/L (ref 0–44)
AST: 22 U/L (ref 15–41)
Albumin: 2.5 g/dL — ABNORMAL LOW (ref 3.5–5.0)
Alkaline Phosphatase: 38 U/L (ref 38–126)
Anion gap: 7 (ref 5–15)
BUN: 22 mg/dL (ref 8–23)
CO2: 23 mmol/L (ref 22–32)
Calcium: 8.5 mg/dL — ABNORMAL LOW (ref 8.9–10.3)
Chloride: 104 mmol/L (ref 98–111)
Creatinine, Ser: 1 mg/dL (ref 0.61–1.24)
GFR calc Af Amer: >60 mL/min (ref >60)
GFR calc non Af Amer: >60 mL/min (ref >60)
Glucose, Bld: 133 mg/dL — ABNORMAL HIGH (ref 70–99)
Potassium: 4.2 mmol/L (ref 3.5–5.1)
Sodium: 134 mmol/L — ABNORMAL LOW (ref 135–145)
Total Bilirubin: 0.4 mg/dL (ref 0.3–1.2)
Total Protein: 4.6 g/dL — ABNORMAL LOW (ref 6.5–8.1)

## 2020-07-19 LAB — CBC
HCT: 24.1 % — ABNORMAL LOW (ref 39.0–52.0)
Hemoglobin: 8 g/dL — ABNORMAL LOW (ref 13.0–17.0)
MCH: 36.4 pg — ABNORMAL HIGH (ref 26.0–34.0)
MCHC: 33.2 g/dL (ref 30.0–36.0)
MCV: 109.5 fL — ABNORMAL HIGH (ref 80.0–100.0)
Platelets: 152 10*3/uL (ref 150–400)
RBC: 2.2 MIL/uL — ABNORMAL LOW (ref 4.22–5.81)
RDW: 12.2 % (ref 11.5–15.5)
WBC: 21.4 10*3/uL — ABNORMAL HIGH (ref 4.0–10.5)
nRBC: 0 % (ref 0.0–0.2)

## 2020-07-19 LAB — GLUCOSE, CAPILLARY
Glucose-Capillary: 116 mg/dL — ABNORMAL HIGH (ref 70–99)
Glucose-Capillary: 127 mg/dL — ABNORMAL HIGH (ref 70–99)
Glucose-Capillary: 176 mg/dL — ABNORMAL HIGH (ref 70–99)
Glucose-Capillary: 198 mg/dL — ABNORMAL HIGH (ref 70–99)
Glucose-Capillary: 208 mg/dL — ABNORMAL HIGH (ref 70–99)
Glucose-Capillary: 103 mg/dL — ABNORMAL HIGH (ref 70–99)
Glucose-Capillary: 89 mg/dL (ref 70–99)

## 2020-07-19 LAB — SURGICAL PATHOLOGY

## 2020-07-19 LAB — BLOOD GAS, ARTERIAL
Acid-base deficit: 3.5 mmol/L — ABNORMAL HIGH (ref 0.0–2.0)
Bicarbonate: 22.5 mmol/L (ref 20.0–28.0)
Drawn by: 29503
FIO2: 100
MECHVT: 600 mL
O2 Saturation: 94.6 %
PEEP: 10 cmH2O
Patient temperature: 98.6
RATE: 24 resp/min
pCO2 arterial: 47.6 mmHg (ref 32.0–48.0)
pH, Arterial: 7.296 — ABNORMAL LOW (ref 7.350–7.450)
pO2, Arterial: 85.8 mmHg (ref 83.0–108.0)

## 2020-07-19 MED ORDER — MIDAZOLAM HCL 2 MG/2ML IJ SOLN
INTRAMUSCULAR | Status: AC
  Administered 2020-07-19: 2 mg
  Filled 2020-07-19: qty 4

## 2020-07-19 MED ORDER — VECURONIUM BROMIDE 10 MG IV SOLR
INTRAVENOUS | Status: AC
  Filled 2020-07-19: qty 10

## 2020-07-19 MED ORDER — POLYETHYLENE GLYCOL 3350 17 G PO PACK
17.0000 g | PACK | Freq: Every day | ORAL | Status: DC
  Administered 2020-07-19 – 2020-07-21 (×3): 17 g via ORAL
  Filled 2020-07-19 (×3): qty 1

## 2020-07-19 MED ORDER — ROCURONIUM BROMIDE 10 MG/ML (PF) SYRINGE
PREFILLED_SYRINGE | INTRAVENOUS | Status: AC
  Administered 2020-07-19: 60 mg
  Filled 2020-07-19: qty 10

## 2020-07-19 MED ORDER — SODIUM CHLORIDE 0.9 % IV SOLN
500.0000 mg | Freq: Once | INTRAVENOUS | Status: AC
  Administered 2020-07-19: 500 mg via INTRAVENOUS
  Filled 2020-07-19: qty 10

## 2020-07-19 MED ORDER — VANCOMYCIN HCL IN DEXTROSE 1-5 GM/200ML-% IV SOLN
1000.0000 mg | Freq: Once | INTRAVENOUS | Status: AC
  Administered 2020-07-19: 1000 mg via INTRAVENOUS
  Filled 2020-07-19: qty 200

## 2020-07-19 MED ORDER — PROPOFOL 1000 MG/100ML IV EMUL
0.0000 ug/kg/min | INTRAVENOUS | Status: DC
  Administered 2020-07-19: 10 ug/kg/min via INTRAVENOUS
  Administered 2020-07-19: 30 ug/kg/min via INTRAVENOUS
  Administered 2020-07-20 (×5): 50 ug/kg/min via INTRAVENOUS
  Administered 2020-07-21: 35 ug/kg/min via INTRAVENOUS
  Filled 2020-07-19 (×9): qty 100

## 2020-07-19 MED ORDER — DOCUSATE SODIUM 50 MG/5ML PO LIQD: 100.0000 mg | Freq: Two times a day (BID) | ORAL | Status: DC

## 2020-07-19 MED ORDER — ALBUTEROL SULFATE (2.5 MG/3ML) 0.083% IN NEBU
INHALATION_SOLUTION | RESPIRATORY_TRACT | Status: AC
  Filled 2020-07-19: qty 3

## 2020-07-19 MED ORDER — SODIUM CHLORIDE 0.9 % IV SOLN
25.0000 mg | Freq: Once | INTRAVENOUS | Status: AC
  Administered 2020-07-19: 25 mg via INTRAVENOUS
  Filled 2020-07-19: qty 0.5

## 2020-07-19 MED ORDER — FENTANYL CITRATE (PF) 100 MCG/2ML IJ SOLN
INTRAMUSCULAR | Status: AC
  Administered 2020-07-19: 100 ug
  Filled 2020-07-19: qty 2

## 2020-07-19 MED ORDER — SODIUM CHLORIDE 3 % IN NEBU
4.0000 mL | INHALATION_SOLUTION | Freq: Two times a day (BID) | RESPIRATORY_TRACT | Status: DC
  Administered 2020-07-19 – 2020-07-21 (×5): 4 mL via RESPIRATORY_TRACT
  Filled 2020-07-19 (×7): qty 4

## 2020-07-19 MED ORDER — FENTANYL CITRATE (PF) 100 MCG/2ML IJ SOLN
25.0000 ug | INTRAMUSCULAR | Status: DC | PRN
  Administered 2020-07-19 (×2): 50 ug via INTRAVENOUS
  Administered 2020-07-19 (×2): 75 ug via INTRAVENOUS
  Administered 2020-07-20: 50 ug via INTRAVENOUS
  Administered 2020-07-20: 75 ug via INTRAVENOUS
  Administered 2020-07-20 (×2): 50 ug via INTRAVENOUS
  Administered 2020-07-20 (×2): 75 ug via INTRAVENOUS
  Administered 2020-07-21 (×2): 50 ug via INTRAVENOUS
  Filled 2020-07-19 (×12): qty 2

## 2020-07-19 MED ORDER — PHENYLEPHRINE HCL-NACL 10-0.9 MG/250ML-% IV SOLN
0.0000 ug/min | INTRAVENOUS | Status: DC
  Administered 2020-07-19: 20 ug/min via INTRAVENOUS
  Administered 2020-07-20: 150 ug/min via INTRAVENOUS
  Administered 2020-07-20 (×2): 170 ug/min via INTRAVENOUS
  Administered 2020-07-20: 50 ug/min via INTRAVENOUS
  Administered 2020-07-20: 120 ug/min via INTRAVENOUS
  Filled 2020-07-19 (×7): qty 250

## 2020-07-19 MED ORDER — FENTANYL CITRATE (PF) 100 MCG/2ML IJ SOLN
25.0000 ug | INTRAMUSCULAR | Status: DC | PRN
  Administered 2020-07-19: 25 ug via INTRAVENOUS
  Filled 2020-07-19: qty 2

## 2020-07-19 MED ORDER — SUCCINYLCHOLINE CHLORIDE 200 MG/10ML IV SOSY
PREFILLED_SYRINGE | INTRAVENOUS | Status: AC
  Filled 2020-07-19: qty 10

## 2020-07-19 MED ORDER — VANCOMYCIN HCL 750 MG/150ML IV SOLN
750.0000 mg | Freq: Two times a day (BID) | INTRAVENOUS | Status: DC
  Administered 2020-07-19 – 2020-07-21 (×3): 750 mg via INTRAVENOUS
  Filled 2020-07-19 (×4): qty 150

## 2020-07-19 MED ORDER — ACETAMINOPHEN 500 MG PO TABS
1000.0000 mg | ORAL_TABLET | Freq: Four times a day (QID) | ORAL | Status: DC
  Administered 2020-07-19 – 2020-07-21 (×4): 1000 mg
  Filled 2020-07-19 (×4): qty 2

## 2020-07-19 MED ORDER — ETOMIDATE 2 MG/ML IV SOLN
INTRAVENOUS | Status: AC
  Administered 2020-07-19: 20 mg
  Filled 2020-07-19: qty 20

## 2020-07-19 MED ORDER — PHENYLEPHRINE 40 MCG/ML (10ML) SYRINGE FOR IV PUSH (FOR BLOOD PRESSURE SUPPORT)
PREFILLED_SYRINGE | INTRAVENOUS | Status: AC
  Filled 2020-07-19: qty 10

## 2020-07-19 MED ORDER — PANTOPRAZOLE SODIUM 40 MG IV SOLR
40.0000 mg | Freq: Every day | INTRAVENOUS | Status: DC
  Administered 2020-07-19: 40 mg via INTRAVENOUS
  Filled 2020-07-19: qty 40

## 2020-07-19 MED ORDER — LIDOCAINE HCL (CARDIAC) PF 100 MG/5ML IV SOSY
PREFILLED_SYRINGE | INTRAVENOUS | Status: AC
  Filled 2020-07-19: qty 5

## 2020-07-19 MED ORDER — STERILE WATER FOR INJECTION IJ SOLN
INTRAMUSCULAR | Status: AC
  Filled 2020-07-19: qty 10

## 2020-07-19 MED ORDER — SODIUM CHLORIDE 0.9 % IV SOLN
2.0000 g | Freq: Three times a day (TID) | INTRAVENOUS | Status: DC
  Administered 2020-07-19 – 2020-07-20 (×3): 2 g via INTRAVENOUS
  Filled 2020-07-19 (×3): qty 2

## 2020-07-19 MED ORDER — ORAL CARE MOUTH RINSE
15.0000 mL | OROMUCOSAL | Status: DC
  Administered 2020-07-19 – 2020-07-21 (×19): 15 mL via OROMUCOSAL

## 2020-07-19 MED ORDER — GABAPENTIN 100 MG PO CAPS: 100.0000 mg | ORAL_CAPSULE | Freq: Three times a day (TID) | ORAL | Status: DC

## 2020-07-19 MED ORDER — IPRATROPIUM-ALBUTEROL 0.5-2.5 (3) MG/3ML IN SOLN
3.0000 mL | RESPIRATORY_TRACT | Status: DC
  Administered 2020-07-19 – 2020-07-21 (×15): 3 mL via RESPIRATORY_TRACT
  Filled 2020-07-19 (×14): qty 3

## 2020-07-19 MED ORDER — CHLORHEXIDINE GLUCONATE 0.12% ORAL RINSE (MEDLINE KIT)
15.0000 mL | Freq: Two times a day (BID) | OROMUCOSAL | Status: DC
  Administered 2020-07-19 – 2020-07-21 (×5): 15 mL via OROMUCOSAL

## 2020-07-19 NOTE — Progress Notes (Addendum)
NAME:  Timothy Shaffer, MRN:  [id number], DOB:  06-07-1951, LOS: 2 ADMISSION DATE:  07/17/2020, CONSULTATION DATE:  8/12 REFERRING MD:  Johney Maine, CHIEF COMPLAINT:  Tension PTX    Brief History   69 year old male admitted 8/11 for robotic colectomy. On 8/12 developed acute hypoxia in setting of spontaneous tension right PTX.   Past Medical History  Colon polyp, tobacco abuse, copd, etoh abuse prior perf duodenal ulcer   Significant Hospital Events   8/11 admitted -underwent robotic colectomy  8/12 spont right PTX. Chest tube placed 8/13 inc wob. New white out of left chest. abx started for possible pna   Consults:  PCCM 8/12 Procedures:  Right small bore chest tube 8/12>>  Significant Diagnostic Tests:    Micro Data:   Antimicrobials:  Cefepime 8/13>>> vanc 8/13>>>  Interim history/subjective:  More hypoxic. Not in distress but does look like WOB worse. New left sided atx/collpase    Objective   Blood pressure (Abnormal) 89/64, pulse 93, temperature 98 F (36.7 C), temperature source Axillary, resp. rate (Abnormal) 30, height 5\' 11"  (1.803 m), weight 62 kg, SpO2 (Abnormal) 81 %.    FiO2 (%):  [100 %] 100 %   Intake/Output Summary (Last 24 hours) at 07/19/2020 5188 Last data filed at 07/19/2020 0843 Gross per 24 hour  Intake 1002.57 ml  Output 576 ml  Net 426.57 ml   Filed Weights   07/17/20 1211 07/19/20 0500  Weight: 58.1 kg 62 kg    Examination: General this is a 69 year old chronically ill white male resting in bed. Now on heated high flow  HENT NCAT. No JVD sclera non-icteric MMM Pulm right chest tube w/out airleak. Decreased on left.  Card RRR  abd soft LLQ dressing intact Ext scattered areas of ecchymosis  Neuro anxious at times. Otherwise moves all ext and is oriented  gu cnc yellow   Resolved Hospital Problem list    Assessment & Plan:  spontaneous right tension pneumothorax.  -now s/p right chest tube->PTX resolved. Suspect spont ruptured  bleb Plan Cont CT to sxn another 24 hrs (as will be needing positive pressure) Repeat CXR in am   Acute on chronic hypoxic respiratory failure in setting of left sided atelectasis/mucous  Plugging +/- PNA.  H/o tobacco abuse  COPD PCXR personally reviewed the CT is in good position. Right lung is clear and PTX resolved. Unfortunately now has almost complete white out of the left hemithorax. This looks like more collapse/atx and PNA and less like effusion  -bedside US done and there was minimal pleural fluid Plan Start humidified High flow w/ 40 liter flow Add HT saline neb Scheduled BDs Will look at chest w/ [place] but I do not think this is effusion Very low threshold for intubation. If he does not recruit w/ high liter flow on High flow will electively intubate as BIPAP in this setting would be less helpful for mucous clearance Start broad spec abx; if intubated will get sputum    S/p robotic hemicolectomy Plan Per surgical team   Mild AKI-->improved w/ IVFs Plan  Cont IVFs Serial chem Avoid hypotension   H/o ETOH Plan Cont CIWA. If intubated try prop   Hyperglycemia  Plan ssi   Mild anemia  Plan  Starting iron Trend cbc  Transfuse if hgb < 7 OR gets sig hypotension   Leukocytosis  Plan Trending cbc Starting abx as above    Best practice:  Diet: cl Pain/Anxiety/Delirium protocol (if indicated): na VAP protocol (  if indicated): na DVT prophylaxis: scd GI prophylaxis: ppi Glucose control: ssi Mobility: inc as tol  Code Status: full code  Family Communication: per surg  Disposition: --->ICU   Labs   CBC: Recent Labs  Lab 07/17/20 1733 07/18/20 0502 07/18/20 1206 07/19/20 0258  WBC 18.8* 17.7* 18.9* 21.4*  HGB 12.5* 10.1* 9.3* 8.0*  HCT 38.0* 30.4* 27.8* 24.1*  MCV 110.5* 109.7* 109.9* 109.5*  PLT 211 202 227 599    Basic Metabolic Panel: Recent Labs  Lab 07/17/20 1733 07/18/20 0502 07/18/20 1339 07/19/20 0258  NA 136 135 134* 134*  K 3.5  4.6 4.1 4.2  CL 104 102 104 104  CO2 23 25 22 23   GLUCOSE 219* 222* 183* 133*  BUN 6* 14 23 22   CREATININE 0.97 1.56* 1.42* 1.00  CALCIUM 8.4* 8.6* 8.4* 8.5*  MG 1.6* 1.5*  --   --   PHOS 3.1  --   --   --    GFR: Estimated Creatinine Clearance: 61.1 mL/min (by C-G formula based on SCr of 1 mg/dL). Recent Labs  Lab 07/17/20 1733 07/18/20 0502 07/18/20 1206 07/19/20 0258  WBC 18.8* 17.7* 18.9* 21.4*    Liver Function Tests: Recent Labs  Lab 07/17/20 1733 07/18/20 1339 07/19/20 0258  AST 32 27 22  ALT 18 18 16   ALKPHOS 54 40 38  BILITOT 0.4 0.5 0.4  PROT 5.3* 5.1* 4.6*  ALBUMIN 3.1* 2.8* 2.5*   No results for input(s): LIPASE, AMYLASE in the last 168 hours. No results for input(s): AMMONIA in the last 168 hours.  ABG    Component Value Date/Time   PHART 7.417 07/18/2020 1136   PCO2ART 31.9 (L) 07/18/2020 1136   PO2ART 42.4 (L) 07/18/2020 1136   HCO3 20.3 07/18/2020 1136   ACIDBASEDEF 3.3 (H) 07/18/2020 1136   O2SAT 77.4 07/18/2020 1136     Coagulation Profile: No results for input(s): INR, PROTIME in the last 168 hours.  Cardiac Enzymes: No results for input(s): CKTOTAL, CKMB, CKMBINDEX, TROPONINI in the last 168 hours.  HbA1C: Hgb A1c MFr Bld  Date/Time Value Ref Range Status  07/08/2020 12:01 PM 5.0 4.8 - 5.6 % Final    Comment:    (NOTE) Pre diabetes:          5.7%-6.4%  Diabetes:              >6.4%  Glycemic control for   <7.0% adults with diabetes     CBG: Recent Labs  Lab 07/18/20 1545 07/18/20 2010 07/18/20 2317 07/19/20 0339 07/19/20 0737  GLUCAP 138* 161* 114* 116* 127*     Critical care time: 34 min      Erick Colace ACNP-BC [place] Pager # [phone] OR # [phone] if no answer

## 2020-07-19 NOTE — Progress Notes (Signed)
Note: Portions of this report may have been transcribed using voice recognition software. Every effort was made to ensure accuracy; however, inadvertent computerized transcription errors may be present.   Any transcriptional errors that result from this process are unintentional.              Timothy Shaffer  [birth year] [id number]  Patient Care Team: Aura Dials, MD as PCP - General (Family Medicine) Michael Boston, MD as Consulting Physician (General Surgery) Wilford Corner, MD as Consulting Physician (Gastroenterology) Deliah Goody, PA-C as Physician Assistant (Family Medicine)  Patient was further respiratory decline.  Intubated.  Bronchoscopy done.  Numerous mucous plugs removed.  On ventilator.  Increasing PEEP and oxygenation.  Patient tachycardic when agitated but then more normal rhythm when more relaxed.  Foley replaced.  Diuresing.  Abdomen softer.  We will place NG tube to low intermittent wall suction for bowel rest.  If plan to keep him on the ventilator for some time, start trophic and eventually full tube feeds as tolerated.  Discussed with critical care nurse, Zoe, at bedside.  Critical care & RN nursing care help appreciated  Patient Active Problem List   Diagnosis Date Noted  . Spontaneous tension pneumothorax s/p chest tube decompression 07/18/2020 07/19/2020    Priority: Medium  . Hyperglycemia 07/19/2020  . Acute respiratory failure ([place])   . Pneumonia   . Mass of cecum 07/17/2020  . 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 History of adenomatous polyp of colon 05/21/2020  . Tobacco abuse 05/21/2020  . Moderate alcohol consumption 05/21/2020  . PUD (peptic ulcer disease)     Past Medical History:  Diagnosis Date  . Asthma    allergies  . Complication of anesthesia   . Family history of adverse reaction to anesthesia    PONV  . Perforated duodenal ulcer ([place]) 05/21/1992   Emergency surgery at [facility], [place], [place]   . PONV (postoperative nausea and vomiting)   . PUD (peptic ulcer disease)     Past Surgical History:  Procedure Laterality Date  . COLONOSCOPY  04/2020  . MASS EXCISION Left 04/24/2014   Procedure: LEFT LONG & RING FINGERS DISTAL INTERPHALANGEAL JOINT/CYST EXCISION;  Surgeon: Cammie Sickle., MD;  Location: [facility];  Service: Orthopedics;  Laterality: Left;  . REPAIR OF PERFORATED ULCER  1993   [facility], [place], [place]    Social History   Socioeconomic History  . Marital status: Married    Spouse name: Not on file  . Number of children: Not on file  . Years of education: Not on file  . Highest education level: Not on file  Occupational History  . Not on file  Tobacco Use  . Smoking status: Current Every Day Smoker    Packs/day: 1.00    Years: 15.00    Pack years: 15.00    Types: Cigarettes  . Smokeless tobacco: Never Used  Vaping Use  . Vaping Use: Never used  Substance and Sexual Activity  . Alcohol use: Yes    Alcohol/week: 12.0 standard drinks    Types: 12 Cans of beer per week  . Drug use: Yes    Types: Marijuana  . Sexual activity: Not on file  Other Topics Concern  . Not on file  Social History Narrative  . Not on file   Social Determinants of Health   Financial Resource Strain:   . Difficulty of Paying Living Expenses:   Food Insecurity:   . Worried About Running  Out of Food in the Last Year:   . [place] in the Last Year:   Transportation Needs:   . Lack of Transportation (Medical):   Marland Kitchen Lack of Transportation (Non-Medical):   Physical Activity:   . Days of Exercise per Week:   . Minutes of Exercise per Session:   Stress:   . Feeling of Stress :   Social Connections:   . Frequency of Communication with Friends and Family:   . Frequency of Social Gatherings with Friends and Family:   . Attends Religious Services:   . Active Member of Clubs or Organizations:   . Attends Archivist Meetings:   Marland Kitchen Marital Status:    Intimate Partner Violence:   . Fear of Current or Ex-Partner:   . Emotionally Abused:   Marland Kitchen Physically Abused:   . Sexually Abused:     History reviewed. No pertinent family history.  Current Facility-Administered Medications  Medication Dose Route Frequency Provider Last Rate Last Admin  . 0.9 %  sodium chloride infusion  250 mL Intravenous PRN Michael Boston, MD      . acetaminophen (TYLENOL) tablet 1,000 mg  1,000 mg Per Tube Q6H Erick Colace, NP   1,000 mg at 07/19/20 1325  . alvimopan (ENTEREG) capsule 12 mg  12 mg Oral BID Michael Boston, MD   12 mg at 07/19/20 1325  . ceFEPIme (MAXIPIME) 2 g in sodium chloride 0.9 % 100 mL IVPB  2 g Intravenous Q8H Angela Adam, [facility]   Stopped at 07/19/20 1325  . chlorhexidine gluconate (MEDLINE KIT) (PERIDEX) 0.12 % solution 15 mL  15 mL Mouth Rinse BID Erick Colace, NP   15 mL at 07/19/20 1232  . Chlorhexidine Gluconate Cloth 2 % PADS 6 each  6 each Topical Daily Marshell Garfinkel, MD   6 each at 07/19/20 1219  . enoxaparin (LOVENOX) injection 40 mg  40 mg Subcutaneous Q24H Earnstine Regal, PA-C   40 mg at 07/19/20 1215  . feeding supplement (ENSURE SURGERY) liquid 237 mL  237 mL Oral BID BM Michael Boston, MD   237 mL at 07/19/20 1417  . fentaNYL (SUBLIMAZE) injection 25 mcg  25 mcg Intravenous Q15 min PRN Erick Colace, NP      . fentaNYL (SUBLIMAZE) injection 25-100 mcg  25-100 mcg Intravenous Q30 min PRN Erick Colace, NP   75 mcg at 07/19/20 1615  . folic acid (FOLVITE) tablet 1 mg  1 mg Oral Daily Michael Boston, MD   1 mg at 07/19/20 1325  . insulin aspart (novoLOG) injection 0-9 Units  0-9 Units Subcutaneous Q4H Erick Colace, NP   3 Units at 07/19/20 1550  . ipratropium-albuterol (DUONEB) 0.5-2.5 (3) MG/3ML nebulizer solution 3 mL  3 mL Nebulization Q4H Erick Colace, NP   3 mL at 07/19/20 1543  . iron dextran complex (INFED) 500 mg in sodium chloride 0.9 % 250 mL IVPB  500 mg Intravenous Once Michael Boston, MD   Stopped  at 07/19/20 1651  . lidocaine (cardiac) 100 mg/68m (XYLOCAINE) 100 MG/5ML injection 2%           . lip balm (CARMEX) ointment 1 application  1 application Topical BID GMichael Boston MD   1 application at [id number]  . magic mouthwash  15 mL Oral QID PRN GMichael Boston MD      . MEDLINE mouth rinse  15 mL Mouth Rinse 10 times per day BErick Colace NP  15 mL at 07/19/20 1548  . ondansetron (ZOFRAN) injection 4 mg  4 mg Intravenous Q6H PRN Michael Boston, MD      . pantoprazole (PROTONIX) injection 40 mg  40 mg Intravenous QHS Salvadore Dom E, NP      . phenylephrine 0.4-0.9 MG/10ML-% injection           . polyethylene glycol (MIRALAX / GLYCOLAX) packet 17 g  17 g Oral Daily Erick Colace, NP   17 g at 07/19/20 1333  . propofol (DIPRIVAN) 1000 MG/100ML infusion  0-50 mcg/kg/min Intravenous Continuous Erick Colace, NP 14.88 mL/hr at 07/19/20 1613 40 mcg/kg/min at 07/19/20 1613  . sodium chloride flush (NS) 0.9 % injection 10 mL  10 mL Intracatheter Q8H Erick Colace, NP   10 mL at 07/19/20 1326  . sodium chloride flush (NS) 0.9 % injection 3 mL  3 mL Intravenous Gorden Harms, MD   3 mL at 07/19/20 1216  . sodium chloride flush (NS) 0.9 % injection 3 mL  3 mL Intravenous PRN Michael Boston, MD      . sodium chloride HYPERTONIC 3 % nebulizer solution 4 mL  4 mL Nebulization Q12H Erick Colace, NP   4 mL at 07/19/20 1138  . sterile water (preservative free) injection           . succinylcholine (ANECTINE) 200 MG/10ML syringe           . thiamine (B-1) injection 100 mg  100 mg Intravenous Daily Michael Boston, MD   100 mg at 07/19/20 1257  . [START ON 07/20/2020] vancomycin (VANCOREADY) IVPB 750 mg/150 mL  750 mg Intravenous Q12H Angela Adam, [facility]      . vecuronium (NORCURON) 10 MG injection              Allergies  Allergen Reactions  . Nsaids     History of perforated ulcer    BP (!) 103/58 (BP Location: Left Arm)   Pulse (!) 133   Temp 98.9 F (37.2 C) (Axillary)    Resp (!) 28   Ht 5' 11" (1.803 m)   Wt 62 kg   SpO2 100%   BMI 19.06 kg/m   DG Chest 1 View  Result Date: 07/18/2020 CLINICAL DATA:  Low O2 sats. EXAM: CHEST  1 VIEW COMPARISON:  07/18/2020 FINDINGS: Right chest tube remains in place, unchanged. No visible right pneumothorax. Worsening airspace disease and volume loss throughout the left lung heart is normal size. No acute bony abnormality. IMPRESSION: Worsening airspace disease and volume loss throughout the left lung. No pneumothorax on the right. Electronically Signed   By: Rolm Baptise M.D.   On: 07/18/2020 21:00   DG Abd 1 View  Result Date: 07/19/2020 CLINICAL DATA:  Check gastric catheter placement EXAM: ABDOMEN - 1 VIEW COMPARISON:  None. FINDINGS: Scattered large and small bowel gas is noted. Gastric catheter is noted within the stomach. Degenerative changes of lumbar spine are noted. Aortic calcifications are seen. IMPRESSION: Gastric catheter within the stomach. Electronically Signed   By: Inez Catalina M.D.   On: 07/19/2020 12:29   DG CHEST PORT 1 VIEW  Result Date: 07/19/2020 CLINICAL DATA:  Follow-up intubation EXAM: PORTABLE CHEST 1 VIEW COMPARISON:  07/19/2020 FINDINGS: Endotracheal tube and gastric catheter are noted in satisfactory position. Right subclavian central venous line is noted at the cavoatrial junction. Pigtail catheter on the right is noted. No pneumothorax is seen. Diffuse airspace opacity is noted again on the left  stable from the prior study. IMPRESSION: Stable appearance of the chest. Interval tube and line placement as described in satisfactory position. Electronically Signed   By: Inez Catalina M.D.   On: 07/19/2020 12:29   DG Chest Port 1 View  Result Date: 07/19/2020 CLINICAL DATA:  Acute respiratory failure. EXAM: PORTABLE CHEST 1 VIEW COMPARISON:  One-view chest x-ray 8/12/1 FINDINGS: Heart is obscured by left-sided airspace disease. Atherosclerotic changes are noted at the arch. Diffuse left-sided  airspace disease has increased. Left pleural effusion again noted. Right-sided chest tube is in place. No pneumothorax is present. Right-sided rib fractures are stable. The right lung is clear. IMPRESSION: 1. Increasing diffuse left-sided airspace disease compatible with pneumonia. 2. Left pleural effusion. 3. Right-sided chest tube without pneumothorax. Electronically Signed   By: San Morelle M.D.   On: 07/19/2020 05:45   DG CHEST PORT 1 VIEW  Result Date: 07/18/2020 CLINICAL DATA:  Status post right chest tube placement for pneumothorax. EXAM: PORTABLE CHEST 1 VIEW COMPARISON:  Single-view of the chest earlier today. FINDINGS: Pigtail catheter is now in place in right chest. Right pneumothorax has almost completely resolved. Only small apical pneumothorax is now seen. Volume loss in the left chest and left basilar airspace disease are again seen. There is shift of the mediastinal contents to the left which is unchanged. The patient is rotated to the right on the study IMPRESSION: Near complete resolution of right pneumothorax after chest tube placement. The patient is somewhat rotated on the study but there is persistent shift of the mediastinal contents to the left which may be chronic. Persistent left basilar airspace disease which may be due to atelectasis given mediastinal shift. Electronically Signed   By: Inge Rise M.D.   On: 07/18/2020 13:37   DG Chest Port 1 View  Result Date: 07/18/2020 CLINICAL DATA:  Respiratory distress. History of recent robotic colectomy. EXAM: PORTABLE CHEST 1 VIEW COMPARISON:  None. FINDINGS: There is a moderate-sized right-sided tension pneumothorax. The pneumothorax is estimated at 25%. There is shift of the heart and mediastinum to the left including tracheal deviation. Associated loss of volume in the left lung with areas of atelectasis. IMPRESSION: Right-sided tension pneumothorax as above. These results were called by telephone at the time of  interpretation on 07/18/2020 at 12:11 pm to the patient's nurse, Caryl Pina, who verbally acknowledged these results. Electronically Signed   By: Marijo Sanes M.D.   On: 07/18/2020 12:13

## 2020-07-19 NOTE — Procedures (Signed)
Bronchoscopy Procedure Note  Timothy Shaffer  [id number]  December 13, 1950  Date:07/19/20  Time:1:01 PM   Provider Performing:Glade Strausser   Procedure(s):  Flexible Bronchoscopy [phone]) and Subsequent Therapeutic Aspiration of Tracheobronchial Tree (62263)  Indication(s) Lt lung mucus plug  Consent Unable to obtain consent due to emergent nature of procedure.   Time Out Verified patient identification, verified procedure, site/side was marked, verified correct patient position, special equipment/implants available, medications/allergies/relevant history reviewed, required imaging and test results available.   Sterile Technique Usual hand hygiene, masks, gowns, and gloves were used   Procedure Description Bronchoscope advanced through endotracheal tube and into airway.  Airways were examined down to subsegmental level with findings noted below.   Following diagnostic evaluation, Therapeutic aspiration performed in left lower lobe  Findings: Mucous plugs and mucopurulent secretions noted in the left lower lobe which were aspirated with aliquots of saline.  Final inspection showed clear tracheobronchial tree bilaterally with no abnormalities.   Complications/Tolerance None; patient tolerated the procedure well. Chest X-ray is needed post procedure.   EBL Minimal   Specimen(s) Bronchial washings for culture  Timothy Garfinkel MD [facility] Please see [email or web address] for pager details.  07/19/2020, 1:04 PM

## 2020-07-19 NOTE — Progress Notes (Signed)
Pharmacy Antibiotic Note  Timothy Shaffer is a 69 y.o. male admitted on 07/17/2020 with  for robotic colectomy. On 8/12 developed acute hypoxia in setting of spontaneous tension right PTX. Marland Kitchen  Pharmacy has been consulted for vancomycin and cefepime dosing for pna.  Plan: Vancomycin 1gm IV x1 then 750mg  q12h Cefepime 2gm IV q8h Follow renal function and clinical course vanc levels as needed  Height: 5\' 11"  (180.3 cm) Weight: 62 kg (136 lb 11 oz) IBW/kg (Calculated) : 75.3  Temp (24hrs), Avg:98.4 F (36.9 C), Min:97.8 F (36.6 C), Max:99.3 F (37.4 C)  Recent Labs  Lab 07/17/20 1733 07/18/20 0502 07/18/20 1206 07/18/20 1339 07/19/20 0258  WBC 18.8* 17.7* 18.9*  --  21.4*  CREATININE 0.97 1.56*  --  1.42* 1.00    Estimated Creatinine Clearance: 61.1 mL/min (by C-G formula based on SCr of 1 mg/dL).    Allergies  Allergen Reactions  . Nsaids     History of perforated ulcer    Antimicrobials this admission: 8/13 vanc >> 8/13 cefepime >> Dose adjustments this admission:   Microbiology results:  8/12 Sputum: mod gram positive cocci, rare gram positive rods 8/12 MRSA PCR: neg  Thank you for allowing pharmacy to be a part of this patient's care.  Dolly Rias RPh 07/19/2020, 10:26 AM

## 2020-07-19 NOTE — Progress Notes (Signed)
OT Cancellation Note  Patient Details Name: Timothy Shaffer MRN: [id number] DOB: February 07, 1951   Cancelled Treatment:    Reason Eval/Treat Not Completed: Medical issues which prohibited therapy. Patient with worsening respiratory status, RN ask to hold. Will continue to follow.  Delbert Phenix OT OT pager: [phone]  Rosemary Holms 07/19/2020, 10:11 AM

## 2020-07-19 NOTE — Procedures (Signed)
Central Venous Catheter Insertion Procedure Note  Timothy Shaffer  [id number]  1951/07/10  Date:07/19/20  Time:1:05 PM   Provider Performing:Amelda Hapke   Procedure: Insertion of Non-tunneled Central Venous Catheter(36556) without US guidance  Indication(s) Medication administration and Difficult access  Consent Unable to obtain consent due to emergent nature of procedure.  Anesthesia Topical only with 1% lidocaine   Timeout Verified patient identification, verified procedure, site/side was marked, verified correct patient position, special equipment/implants available, medications/allergies/relevant history reviewed, required imaging and test results available.  Sterile Technique Maximal sterile technique including full sterile barrier drape, hand hygiene, sterile gown, sterile gloves, mask, hair covering, sterile ultrasound probe cover (if used).  Procedure Description Area of catheter insertion was cleaned with chlorhexidine and draped in sterile fashion.  Without real-time ultrasound guidance a central venous catheter was placed into the right subclavian vein. Nonpulsatile blood flow and easy flushing noted in all ports.  The catheter was sutured in place and sterile dressing applied.  Complications/Tolerance None; patient tolerated the procedure well. Chest X-ray is ordered to verify placement.  EBL Minimal  Specimen(s) None  [name] MD [facility] Please see [email or web address] for pager details.  07/19/2020, 1:05 PM

## 2020-07-19 NOTE — Procedures (Signed)
Intubation Procedure Note  Divon Krabill  [id number]  March 11, 1951  Date:07/19/20  Time:12:14 PM   Provider Performing:Pete E Kary Kos    Procedure: Intubation (31500)  Indication(s) Respiratory Failure  Consent Risks of the procedure as well as the alternatives and risks of each were explained to the patient and/or caregiver.  Consent for the procedure was obtained and is signed in the bedside chart   Anesthesia Etomidate, Versed, Fentanyl and Rocuronium   Time Out Verified patient identification, verified procedure, site/side was marked, verified correct patient position, special equipment/implants available, medications/allergies/relevant history reviewed, required imaging and test results available.   Sterile Technique Usual hand hygeine, masks, and gloves were used   Procedure Description Patient positioned in bed supine.  Sedation given as noted above.  Patient was intubated with endotracheal tube using 3 mac .  View was Grade 1 full glottis .  Number of attempts was 1.  Colorimetric CO2 detector was consistent with tracheal placement.   Complications/Tolerance None; patient tolerated the procedure well. Chest X-ray is ordered to verify placement.   EBL Minimal   Specimen(s) None  Erick Colace ACNP-BC [place] Pager # [phone] OR # [phone] if no answer

## 2020-07-19 NOTE — Progress Notes (Signed)
[facility] Progress Note Patient Name: Timothy Shaffer DOB: [birth year] MRN: [id number]   Date of Service  07/19/2020  HPI/Events of Note  Hypotension, MAP 57, heart rate 103  eICU Interventions  Phenylephrine infusion ordered.        Frederik Pear 07/19/2020, 9:54 PM

## 2020-07-19 NOTE — TOC Initial Note (Addendum)
Transition of Care [facility]) - Initial/Assessment Note    Patient Details  Name: Timothy Shaffer MRN: [id number] Date of Birth: May 01, 1951  Transition of Care [facility]) CM/SW Contact:    Leeroy Cha, RN Phone Number: 07/19/2020, 8:37 AM  Clinical Narrative:                 S/p robotic colectomy, post op pna and spontaneous peumonthoarx, wbc 26.4, iv albumin, iv iron, iv robaxin. hfnrbm at 15l/min,right chest tube. Following for progression and toc needs.  Expected Discharge Plan: Home/Self Care Barriers to Discharge: Continued Medical Work up   Patient Goals and CMS Choice Patient states their goals for this hospitalization and ongoing recovery are:: to go back home CMS Medicare.gov Compare Post Acute Care list provided to:: Patient    Expected Discharge Plan and Services Expected Discharge Plan: Home/Self Care   Discharge Planning Services: CM Consult   Living arrangements for the past 2 months: Single Family Home                                      Prior Living Arrangements/Services Living arrangements for the past 2 months: Single Family Home Lives with:: Spouse Patient language and need for interpreter reviewed:: Yes Do you feel safe going back to the place where you live?: Yes      Need for Family Participation in Patient Care: Yes (Comment) Care giver support system in place?: Yes (comment)   Criminal Activity/Legal Involvement Pertinent to Current Situation/Hospitalization: No - Comment as needed  Activities of Daily Living Home Assistive Devices/Equipment: Dentures (specify type) ADL Screening (condition at time of admission) Patient's cognitive ability adequate to safely complete daily activities?: Yes Is the patient deaf or have difficulty hearing?: No Does the patient have difficulty seeing, even when wearing glasses/contacts?: No Does the patient have difficulty concentrating, remembering, or making decisions?: No Patient able to express need for  assistance with ADLs?: Yes Does the patient have difficulty dressing or bathing?: No Independently performs ADLs?: Yes (appropriate for developmental age) Does the patient have difficulty walking or climbing stairs?: No Weakness of Legs: None Weakness of Arms/Hands: None  Permission Sought/Granted                  Emotional Assessment Appearance:: Appears stated age Attitude/Demeanor/Rapport: Engaged Affect (typically observed): Calm Orientation: : Oriented to Self, Oriented to Place, Oriented to  Time, Oriented to Situation Alcohol / Substance Use: Not Applicable Psych Involvement: No (comment)  Admission diagnosis:  Mass of cecum [K63.89] Patient Active Problem List   Diagnosis Date Noted  . Spontaneous tension pneumothorax s/p chest tube decompression 07/18/2020 07/19/2020  . Mass of cecum 07/17/2020  . 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 History of adenomatous polyp of colon 05/21/2020  . Tobacco abuse 05/21/2020  . Moderate alcohol consumption 05/21/2020  . PUD (peptic ulcer disease)   . Perforated duodenal ulcer ([place]) 05/21/1992   PCP:  Aura Dials, MD Pharmacy:   [id number] - [place], [place] [address] Phone: [phone] Fax: [phone]     Social Determinants of Health (SDOH) Interventions    Readmission Risk Interventions No flowsheet data found.

## 2020-07-19 NOTE — Progress Notes (Signed)
Timothy Shaffer [id number] [birth year]  CARE TEAM:  PCP: Aura Dials, MD  Outpatient Care Team: Patient Care Team: Aura Dials, MD as PCP - General (Family Medicine) Michael Boston, MD as Consulting Physician (General Surgery) Wilford Corner, MD as Consulting Physician (Gastroenterology) Deliah Goody, PA-C as Physician Assistant (Family Medicine)  Inpatient Treatment Team: Treatment Team: Attending Provider: Michael Boston, MD; Technician: Ernest Mallick, NT; Consulting Physician: Pccm, Md, MD; Registered Nurse: Virl Cagey, RN; Occupational Therapist: Rosemary Holms, OT; Physical Therapist: Mathis Fare, PT; Registered Nurse: Shon Hough, RN; Utilization Review: Tressie Stalker, RN   Problem List:   Principal Problem:   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  Possible pneumonia  [facility] Stay = 2 days)  Plan:  Pulmonary toilet.  Nebulization.  Pulmonary critical care consultation.  Discussed with PA Salvadore Dom.  They will continue to help follow.  Concern on x-ray for possible pneumonia.  He has a mild cough but it is clear.  He does not really have much in the way of a fever.  White count has increased somewhat but do not know if that is related to stress of everything or legitimate infection.  I am skeptical of a pneumonia especially since it is left-sided, but will defer to pulmonary if they  feel it should be treated more aggressively.  Postoperative on top of chronic anemia.  Most likely related to surgery and need for Lovenox anticoagulation part of protocol.  IV iron supplementation for now.  Can consider transfusion if they are worried about his hypoxemia.  Medlock on IV fluids.  Try and keep on the dry side since creatinine now normal.  Okay to diurese from my standpoint as long as we follow his urine output and creatinine closely.   As needed boluses.  We will try albumin.  CIWA alcohol withdrawal protocol.  He looks much more alert today, a guardedly hopeful sign.  Folate and thiamine.  Multivitamin.  Follow-up on pathology.  Clear liquids.  Dysphagia 1 as tolerated.  Would hold off advancing until his hypoxemia and ileus have resolved  Try and mobilize.  A challenge with his hypoxemia.  Last physical and Occupational Therapy to evaluate to see if he needs home health or skilled facility.  VTE prophylaxis- SCDs, etc  mobilize as tolerated to help recovery  35 minutes spent in review, evaluation, examination, counseling, and coordination of care.  More than 50% of that time was spent in counseling.  07/19/2020    Subjective: (Chief complaint)  No major events in ICU.  Remains on oxygen mask.  More alert today.  Pain under better control.  Occasional cough spitting up clear colorless mucus.  Denies nausea.  Objective:  Vital signs:  Vitals:   07/19/20 0300 07/19/20 0400 07/19/20 0500 07/19/20 0600  BP: (!) 107/56 (!) 126/54 (!) 100/54 (!) 109/59  Pulse: (!) 113 100 94 98  Resp: (!) 30 (!) 30 (!) 26 (!) 23  Temp:  99.3 F (37.4 C)    TempSrc:  Axillary    SpO2: (!) 79% 91% 97% 94%  Weight:   62 kg   Height:        Last BM Date: 07/17/20  Intake/Output   Yesterday:  08/12 0701 - 08/13 0700 In: 1002.6 [P.O.:240; I.V.:762.6] Out: 376 [Urine:350; Chest Tube:26] This shift:  No intake/output data recorded.  Bowel function:  Flatus:  No  BM:  No  Drain: (No drain)   Physical Exam:  General: Pt awake/alert in mild acute distress.  Tired but not toxic Eyes: PERRL, normal EOM.  Sclera clear.  No icterus Neuro: CN II-XII intact w/o focal sensory/motor deficits. Lymph: No head/neck/groin lymphadenopathy Psych:  No delerium/psychosis/paranoia.  Oriented x 4 HENT: Normocephalic, Mucus membranes moist.  No thrush Neck: Supple, No tracheal deviation.  No obvious thyromegaly Chest: Coarse breath sounds bilaterally.  Chest tube in right infraclavicular space.  Mild conversational dyspnea.   CV:  Pulses intact.  Regular rhythm.  No major extremity edema MS: Normal AROM mjr joints.  No obvious deformity  Abdomen: Soft.  Nondistended.  Mildly tender at incisions only.  No evidence of peritonitis.  No incarcerated hernias.  GU: Ecchymosis on penis and scrotum but no major edema.  Condom catheter in place.  Clear colorless yellow urine Ext:  No deformity.  No mjr edema.  No cyanosis Skin: No petechiae / purpurea.  No major sores.  Warm and dry    Results:   Cultures: Recent Results (from the past 720 hour(s))  SARS CORONAVIRUS 2 (TAT 6-24 HRS) Nasopharyngeal Nasopharyngeal Swab     Status: None   Collection Time: 07/13/20 10:15 AM   Specimen: Nasopharyngeal Swab  Result Value Ref Range Status   SARS Coronavirus 2 NEGATIVE NEGATIVE Final    Comment: (NOTE) SARS-CoV-2 target nucleic acids are NOT DETECTED.  The SARS-CoV-2 RNA is generally detectable in upper and lower respiratory specimens during the acute phase of infection. Negative results do not preclude SARS-CoV-2 infection, do not rule out co-infections with other pathogens, and should not be used as the sole basis for treatment or other patient management decisions. Negative results must be combined with clinical observations, patient history, and epidemiological information. The expected result is Negative.  Fact Sheet for  Patients: SugarRoll.be  Fact Sheet for Healthcare Providers: https://www.woods-mathews.com/  This test is not yet approved or cleared by the [place] FDA and  has been authorized for detection and/or diagnosis of SARS-CoV-2 by FDA under an Emergency Use Authorization (EUA). This EUA will remain  in effect (meaning this test can be used) for the duration of the COVID-19 declaration under Se ction 564(b)(1) of the Act, 21 U.S.C. section 360bbb-3(b)(1), unless the authorization is terminated or revoked sooner.  Performed at [facility], [place] [address], [place], [place] [address]   MRSA PCR Screening     Status: None   Collection Time: 07/18/20  1:43 PM   Specimen: Nasal Mucosa; Nasopharyngeal  Result Value Ref Range Status   MRSA by PCR NEGATIVE NEGATIVE Final    Comment:        The GeneXpert MRSA Assay (FDA approved for NASAL specimens only), is one component of a comprehensive MRSA colonization surveillance program. It is not intended to diagnose MRSA infection nor to guide or monitor treatment for MRSA infections. Performed at [facility], [place] [address], [place], [place] [address]   Culture, respiratory (non-expectorated)     Status: None (Preliminary result)  Collection Time: 07/18/20  9:16 PM   Specimen: Tracheal Aspirate; Respiratory  Result Value Ref Range Status   Specimen Description   Final    TRACHEAL ASPIRATE Performed at [place] [address], [place], [place] [address]    Special Requests   Final    NONE Performed at [facility], [place] [address], [place], [place] [address]    Gram Stain   Final    NO WBC SEEN FEW SQUAMOUS EPITHELIAL CELLS PRESENT MODERATE GRAM POSITIVE COCCI RARE GRAM POSITIVE RODS Performed at [facility], [place] [address], [place], [place] [address]    Culture PENDING  Incomplete   Report Status PENDING   Incomplete    Labs: Results for orders placed or performed during the hospital encounter of 07/17/20 (from the past 48 hour(s))  ABO/Rh     Status: None   Collection Time: 07/17/20 12:45 PM  Result Value Ref Range   ABO/RH(D)      A POS Performed at [facility], [place] [address], [place], [place] [address]   Comprehensive metabolic panel     Status: Abnormal   Collection Time: 07/17/20  5:33 PM  Result Value Ref Range   Sodium 136 135 - 145 mmol/L   Potassium 3.5 3.5 - 5.1 mmol/L   Chloride 104 98 - 111 mmol/L   CO2 23 22 - 32 mmol/L   Glucose, Bld 219 (H) 70 - 99 mg/dL    Comment: Glucose reference range applies only to samples taken after fasting for at least 8 hours.   BUN 6 (L) 8 - 23 mg/dL   Creatinine, Ser 0.97 0.61 - 1.24 mg/dL   Calcium 8.4 (L) 8.9 - 10.3 mg/dL   Total Protein 5.3 (L) 6.5 - 8.1 g/dL   Albumin 3.1 (L) 3.5 - 5.0 g/dL   AST 32 15 - 41 U/L   ALT 18 0 - 44 U/L   Alkaline Phosphatase 54 38 - 126 U/L   Total Bilirubin 0.4 0.3 - 1.2 mg/dL   GFR calc non Af Amer >60 >60 mL/min   GFR calc Af Amer >60 >60 mL/min   Anion gap 9 5 - 15    Comment: Performed at [facility], [place] [address], [place], [place] [address]  Magnesium     Status: Abnormal   Collection Time: 07/17/20  5:33 PM  Result Value Ref Range   Magnesium 1.6 (L) 1.7 - 2.4 mg/dL    Comment: Performed at [facility], [place] [address] [place], [place] [address]  Phosphorus     Status: None   Collection Time: 07/17/20  5:33 PM  Result Value Ref Range   Phosphorus 3.1 2.5 - 4.6 mg/dL    Comment: Performed at [facility], [place] [address] [place], [place] [address]  CBC     Status: Abnormal   Collection Time: 07/17/20  5:33 PM  Result Value Ref Range   WBC 18.8 (H) 4.0 - 10.5 K/uL   RBC 3.44 (L) 4.22 - 5.81 MIL/uL   Hemoglobin 12.5 (L) 13.0 - 17.0 g/dL   HCT 38.0 (L) 39 - 52 %   MCV 110.5 (H) 80.0 - 100.0 fL   MCH 36.3  (H) 26.0 - 34.0 pg   MCHC 32.9 30.0 - 36.0 g/dL   RDW 12.3 11.5 - 15.5 %   Platelets 211 150 - 400 K/uL   nRBC 0.0 0.0 - 0.2 %  Comment: Performed at [facility], [place] [address] [place], [place] [address]  Basic metabolic panel     Status: Abnormal   Collection Time: 07/18/20  5:02 AM  Result Value Ref Range   Sodium 135 135 - 145 mmol/L   Potassium 4.6 3.5 - 5.1 mmol/L    Comment: DELTA CHECK NOTED NO VISIBLE HEMOLYSIS    Chloride 102 98 - 111 mmol/L   CO2 25 22 - 32 mmol/L   Glucose, Bld 222 (H) 70 - 99 mg/dL    Comment: Glucose reference range applies only to samples taken after fasting for at least 8 hours.   BUN 14 8 - 23 mg/dL   Creatinine, Ser 1.56 (H) 0.61 - 1.24 mg/dL   Calcium 8.6 (L) 8.9 - 10.3 mg/dL   GFR calc non Af Amer 45 (L) >60 mL/min   GFR calc Af Amer 52 (L) >60 mL/min   Anion gap 8 5 - 15    Comment: Performed at [facility], [place] [address] [place], [place] [address]  CBC     Status: Abnormal   Collection Time: 07/18/20  5:02 AM  Result Value Ref Range   WBC 17.7 (H) 4.0 - 10.5 K/uL   RBC 2.77 (L) 4.22 - 5.81 MIL/uL   Hemoglobin 10.1 (L) 13.0 - 17.0 g/dL   HCT 30.4 (L) 39 - 52 %   MCV 109.7 (H) 80.0 - 100.0 fL   MCH 36.5 (H) 26.0 - 34.0 pg   MCHC 33.2 30.0 - 36.0 g/dL   RDW 12.1 11.5 - 15.5 %   Platelets 202 150 - 400 K/uL   nRBC 0.0 0.0 - 0.2 %    Comment: Performed at [facility], [place] [address] [place], [place] [address]  Magnesium     Status: Abnormal   Collection Time: 07/18/20  5:02 AM  Result Value Ref Range   Magnesium 1.5 (L) 1.7 - 2.4 mg/dL    Comment: Performed at [facility], [place] [address], [place], [place] [address]  Blood gas, arterial     Status: Abnormal   Collection Time: 07/18/20 11:36 AM  Result Value Ref Range   FIO2 21.00    Delivery systems NON-REBREATHER OXYGEN MASK    pH, Arterial 7.417 7.35 - 7.45   pCO2 arterial 31.9 (L) 32 - 48 mmHg    pO2, Arterial 42.4 (L) 83 - 108 mmHg   Bicarbonate 20.3 20.0 - 28.0 mmol/L   Acid-base deficit 3.3 (H) 0.0 - 2.0 mmol/L   O2 Saturation 77.4 %   Patient temperature 97.8    Collection site RIGHT RADIAL    Drawn by [id number]    Allens test (pass/fail) PASS PASS    Comment: Performed at [place] [address] [place], [place] [address]  CBC     Status: Abnormal   Collection Time: 07/18/20 12:06 PM  Result Value Ref Range   WBC 18.9 (H) 4.0 - 10.5 K/uL   RBC 2.53 (L) 4.22 - 5.81 MIL/uL   Hemoglobin 9.3 (L) 13.0 - 17.0 g/dL   HCT 27.8 (L) 39 - 52 %   MCV 109.9 (H) 80.0 - 100.0 fL   MCH 36.8 (H) 26.0 - 34.0 pg   MCHC 33.5 30.0 - 36.0 g/dL   RDW 12.2 11.5 - 15.5 %   Platelets 227 150 - 400 K/uL   nRBC 0.0 0.0 - 0.2 %    Comment: Performed at [facility],  [place] [address], [place], [place] [address]  Comprehensive metabolic panel     Status: Abnormal   Collection Time: 07/18/20  1:39 PM  Result Value Ref Range   Sodium 134 (L) 135 - 145 mmol/L   Potassium 4.1 3.5 - 5.1 mmol/L   Chloride 104 98 - 111 mmol/L   CO2 22 22 - 32 mmol/L   Glucose, Bld 183 (H) 70 - 99 mg/dL    Comment: Glucose reference range applies only to samples taken after fasting for at least 8 hours.   BUN 23 8 - 23 mg/dL   Creatinine, Ser 1.42 (H) 0.61 - 1.24 mg/dL   Calcium 8.4 (L) 8.9 - 10.3 mg/dL   Total Protein 5.1 (L) 6.5 - 8.1 g/dL   Albumin 2.8 (L) 3.5 - 5.0 g/dL   AST 27 15 - 41 U/L   ALT 18 0 - 44 U/L   Alkaline Phosphatase 40 38 - 126 U/L   Total Bilirubin 0.5 0.3 - 1.2 mg/dL   GFR calc non Af Amer 50 (L) >60 mL/min   GFR calc Af Amer 58 (L) >60 mL/min   Anion gap 8 5 - 15    Comment: Performed at [facility], [place] [address], [place], [place] [address]  MRSA PCR Screening     Status: None   Collection Time: 07/18/20  1:43 PM   Specimen: Nasal Mucosa; Nasopharyngeal  Result Value Ref Range   MRSA by PCR NEGATIVE NEGATIVE    Comment:         The GeneXpert MRSA Assay (FDA approved for NASAL specimens only), is one component of a comprehensive MRSA colonization surveillance program. It is not intended to diagnose MRSA infection nor to guide or monitor treatment for MRSA infections. Performed at [facility], [place] [address], [place], [place] [address]   Glucose, capillary     Status: Abnormal   Collection Time: 07/18/20  2:07 PM  Result Value Ref Range   Glucose-Capillary 164 (H) 70 - 99 mg/dL    Comment: Glucose reference range applies only to samples taken after fasting for at least 8 hours.  Glucose, capillary     Status: Abnormal   Collection Time: 07/18/20  3:45 PM  Result Value Ref Range   Glucose-Capillary 138 (H) 70 - 99 mg/dL    Comment: Glucose reference range applies only to samples taken after fasting for at least 8 hours.  Glucose, capillary     Status: Abnormal   Collection Time: 07/18/20  8:10 PM  Result Value Ref Range   Glucose-Capillary 161 (H) 70 - 99 mg/dL    Comment: Glucose reference range applies only to samples taken after fasting for at least 8 hours.   Comment 1 Notify RN    Comment 2 Document in Chart   Culture, respiratory (non-expectorated)     Status: None (Preliminary result)   Collection Time: 07/18/20  9:16 PM   Specimen: Tracheal Aspirate; Respiratory  Result Value Ref Range   Specimen Description      TRACHEAL ASPIRATE Performed at [place] [address], [place], [place] [address]    Special Requests      NONE Performed at [facility], [place] [address], [place], [place] [address]    Gram Stain      NO WBC SEEN FEW SQUAMOUS EPITHELIAL CELLS PRESENT MODERATE GRAM POSITIVE COCCI RARE GRAM POSITIVE RODS Performed at [facility], [place] [address], [place], [place] [address]  Culture PENDING    Report Status PENDING   Glucose, capillary     Status: Abnormal   Collection Time: 07/18/20 11:17 PM  Result Value  Ref Range   Glucose-Capillary 114 (H) 70 - 99 mg/dL    Comment: Glucose reference range applies only to samples taken after fasting for at least 8 hours.   Comment 1 Notify RN    Comment 2 Document in Chart   CBC     Status: Abnormal   Collection Time: 07/19/20  2:58 AM  Result Value Ref Range   WBC 21.4 (H) 4.0 - 10.5 K/uL   RBC 2.20 (L) 4.22 - 5.81 MIL/uL   Hemoglobin 8.0 (L) 13.0 - 17.0 g/dL   HCT 24.1 (L) 39 - 52 %   MCV 109.5 (H) 80.0 - 100.0 fL   MCH 36.4 (H) 26.0 - 34.0 pg   MCHC 33.2 30.0 - 36.0 g/dL   RDW 12.2 11.5 - 15.5 %   Platelets 152 150 - 400 K/uL   nRBC 0.0 0.0 - 0.2 %    Comment: Performed at [facility], [place] [address], [place], [place] [address]  Comprehensive metabolic panel     Status: Abnormal   Collection Time: 07/19/20  2:58 AM  Result Value Ref Range   Sodium 134 (L) 135 - 145 mmol/L   Potassium 4.2 3.5 - 5.1 mmol/L   Chloride 104 98 - 111 mmol/L   CO2 23 22 - 32 mmol/L   Glucose, Bld 133 (H) 70 - 99 mg/dL    Comment: Glucose reference range applies only to samples taken after fasting for at least 8 hours.   BUN 22 8 - 23 mg/dL   Creatinine, Ser 1.00 0.61 - 1.24 mg/dL   Calcium 8.5 (L) 8.9 - 10.3 mg/dL   Total Protein 4.6 (L) 6.5 - 8.1 g/dL   Albumin 2.5 (L) 3.5 - 5.0 g/dL   AST 22 15 - 41 U/L   ALT 16 0 - 44 U/L   Alkaline Phosphatase 38 38 - 126 U/L   Total Bilirubin 0.4 0.3 - 1.2 mg/dL   GFR calc non Af Amer >60 >60 mL/min   GFR calc Af Amer >60 >60 mL/min   Anion gap 7 5 - 15    Comment: Performed at [facility], [place] [address] [place],  [address]  Glucose, capillary     Status: Abnormal   Collection Time: 07/19/20  3:39 AM  Result Value Ref Range   Glucose-Capillary 116 (H) 70 - 99 mg/dL    Comment: Glucose reference range applies only to samples taken after fasting for at least 8 hours.   Comment 1 Notify RN    Comment 2 Document in Chart   Glucose, capillary     Status: Abnormal    Collection Time: 07/19/20  7:37 AM  Result Value Ref Range   Glucose-Capillary 127 (H) 70 - 99 mg/dL    Comment: Glucose reference range applies only to samples taken after fasting for at least 8 hours.    Imaging / Studies: DG Chest 1 View  Result Date: 07/18/2020 CLINICAL DATA:  Low O2 sats. EXAM: CHEST  1 VIEW COMPARISON:  07/18/2020 FINDINGS: Right chest tube remains in place, unchanged. No visible right pneumothorax. Worsening airspace disease and volume loss throughout the left lung heart is normal size. No acute bony abnormality. IMPRESSION: Worsening airspace disease and volume loss throughout the left lung. No pneumothorax on the right. Electronically Signed  By: Rolm Baptise M.D.   On: 07/18/2020 21:00   DG Chest Port 1 View  Result Date: 07/19/2020 CLINICAL DATA:  Acute respiratory failure. EXAM: PORTABLE CHEST 1 VIEW COMPARISON:  One-view chest x-ray 8/12/1 FINDINGS: Heart is obscured by left-sided airspace disease. Atherosclerotic changes are noted at the arch. Diffuse left-sided airspace disease has increased. Left pleural effusion again noted. Right-sided chest tube is in place. No pneumothorax is present. Right-sided rib fractures are stable. The right lung is clear. IMPRESSION: 1. Increasing diffuse left-sided airspace disease compatible with pneumonia. 2. Left pleural effusion. 3. Right-sided chest tube without pneumothorax. Electronically Signed   By: San Morelle M.D.   On: 07/19/2020 05:45   DG CHEST PORT 1 VIEW  Result Date: 07/18/2020 CLINICAL DATA:  Status post right chest tube placement for pneumothorax. EXAM: PORTABLE CHEST 1 VIEW COMPARISON:  Single-view of the chest earlier today. FINDINGS: Pigtail catheter is now in place in right chest. Right pneumothorax has almost completely resolved. Only small apical pneumothorax is now seen. Volume loss in the left chest and left basilar airspace disease are again seen. There is shift of the mediastinal contents to the  left which is unchanged. The patient is rotated to the right on the study IMPRESSION: Near complete resolution of right pneumothorax after chest tube placement. The patient is somewhat rotated on the study but there is persistent shift of the mediastinal contents to the left which may be chronic. Persistent left basilar airspace disease which may be due to atelectasis given mediastinal shift. Electronically Signed   By: Inge Rise M.D.   On: 07/18/2020 13:37   DG Chest Port 1 View  Result Date: 07/18/2020 CLINICAL DATA:  Respiratory distress. History of recent robotic colectomy. EXAM: PORTABLE CHEST 1 VIEW COMPARISON:  None. FINDINGS: There is a moderate-sized right-sided tension pneumothorax. The pneumothorax is estimated at 25%. There is shift of the heart and mediastinum to the left including tracheal deviation. Associated loss of volume in the left lung with areas of atelectasis. IMPRESSION: Right-sided tension pneumothorax as above. These results were called by telephone at the time of interpretation on 07/18/2020 at 12:11 pm to the patient's nurse, Caryl Pina, who verbally acknowledged these results. Electronically Signed   By: Marijo Sanes M.D.   On: 07/18/2020 12:13    Medications / Allergies: per chart  Antibiotics: Anti-infectives (From admission, onward)   Start     Dose/Rate Route Frequency Ordered Stop   07/18/20 0100  cefoTEtan (CEFOTAN) 2 g in sodium chloride 0.9 % 100 mL IVPB        2 g 200 mL/hr over 30 Minutes Intravenous Every 12 hours 07/17/20 1721 07/18/20 1029   07/17/20 1400  neomycin (MYCIFRADIN) tablet 1,000 mg  Status:  Discontinued       "And" Linked Group Details   1,000 mg Oral 3 times per day 07/17/20 1207 07/17/20 1723   07/17/20 1400  metroNIDAZOLE (FLAGYL) tablet 1,000 mg  Status:  Discontinued       "And" Linked Group Details   1,000 mg Oral 3 times per day 07/17/20 1207 07/17/20 1723   07/17/20 1215  cefoTEtan (CEFOTAN) 2 g in sodium chloride 0.9 % 100 mL  IVPB        2 g 200 mL/hr over 30 Minutes Intravenous On call to O.R. 07/17/20 1207 07/17/20 1312        Note: Portions of this report may have been transcribed using voice recognition software. Every effort was made to ensure accuracy; however,  inadvertent computerized transcription errors may be present.   Any transcriptional errors that result from this process are unintentional.    Adin Hector, MD, FACS, MASCRS Gastrointestinal and Minimally Invasive Surgery  [facility] [address], [place],  [address] [phone] Fax [phone] Main/Paging  CONTACT INFORMATION: Weekday (9AM-5PM) concerns: Call [facility] main office at [phone] Weeknight (5PM-9AM) or Weekend/Holiday concerns: Check [email or web address] for General Surgery [facility] coverage (Please, do not use SecureChat as it is not reliable communication to operating surgeons for immediate patient care)      07/19/2020  7:56 AM

## 2020-07-19 NOTE — Progress Notes (Signed)
PT Cancellation Note  Patient Details Name: Timothy Shaffer MRN: [id number] DOB: November 21, 1951   Cancelled Treatment:     PT order received but eval deferred - RN advises pt with worsening respiratory status.  Will follow.   Yuki Brunsman 07/19/2020, 10:23 AM

## 2020-07-20 ENCOUNTER — Inpatient Hospital Stay (HOSPITAL_COMMUNITY): Payer: Medicare HMO

## 2020-07-20 DIAGNOSIS — J9601 Acute respiratory failure with hypoxia: Secondary | ICD-10-CM | POA: Diagnosis not present

## 2020-07-20 DIAGNOSIS — J9602 Acute respiratory failure with hypercapnia: Secondary | ICD-10-CM

## 2020-07-20 DIAGNOSIS — J930 Spontaneous tension pneumothorax: Secondary | ICD-10-CM

## 2020-07-20 DIAGNOSIS — J189 Pneumonia, unspecified organism: Secondary | ICD-10-CM | POA: Diagnosis not present

## 2020-07-20 DIAGNOSIS — R579 Shock, unspecified: Secondary | ICD-10-CM | POA: Diagnosis not present

## 2020-07-20 LAB — COMPREHENSIVE METABOLIC PANEL
ALT: 16 U/L (ref 0–44)
AST: 21 U/L (ref 15–41)
Albumin: 2.4 g/dL — ABNORMAL LOW (ref 3.5–5.0)
Alkaline Phosphatase: 41 U/L (ref 38–126)
Anion gap: 8 (ref 5–15)
BUN: 27 mg/dL — ABNORMAL HIGH (ref 8–23)
CO2: 22 mmol/L (ref 22–32)
Calcium: 8.5 mg/dL — ABNORMAL LOW (ref 8.9–10.3)
Chloride: 106 mmol/L (ref 98–111)
Creatinine, Ser: 1.17 mg/dL (ref 0.61–1.24)
GFR calc Af Amer: >60 mL/min (ref >60)
GFR calc non Af Amer: >60 mL/min (ref >60)
Glucose, Bld: 117 mg/dL — ABNORMAL HIGH (ref 70–99)
Potassium: 4.2 mmol/L (ref 3.5–5.1)
Sodium: 136 mmol/L (ref 135–145)
Total Bilirubin: 0.8 mg/dL (ref 0.3–1.2)
Total Protein: 4.7 g/dL — ABNORMAL LOW (ref 6.5–8.1)

## 2020-07-20 LAB — GLUCOSE, CAPILLARY
Glucose-Capillary: 118 mg/dL — ABNORMAL HIGH (ref 70–99)
Glucose-Capillary: 109 mg/dL — ABNORMAL HIGH (ref 70–99)
Glucose-Capillary: 99 mg/dL (ref 70–99)
Glucose-Capillary: 100 mg/dL — ABNORMAL HIGH (ref 70–99)
Glucose-Capillary: 104 mg/dL — ABNORMAL HIGH (ref 70–99)
Glucose-Capillary: 90 mg/dL (ref 70–99)

## 2020-07-20 LAB — TYPE AND SCREEN
ABO/RH(D): A POS
Antibody Screen: NEGATIVE

## 2020-07-20 LAB — PREPARE RBC (CROSSMATCH)

## 2020-07-20 LAB — CBC
HCT: 20.6 % — ABNORMAL LOW (ref 39.0–52.0)
Hemoglobin: 7 g/dL — ABNORMAL LOW (ref 13.0–17.0)
MCH: 37.2 pg — ABNORMAL HIGH (ref 26.0–34.0)
MCHC: 34 g/dL (ref 30.0–36.0)
MCV: 109.6 fL — ABNORMAL HIGH (ref 80.0–100.0)
Platelets: 180 10*3/uL (ref 150–400)
RBC: 1.88 MIL/uL — ABNORMAL LOW (ref 4.22–5.81)
RDW: 12.5 % (ref 11.5–15.5)
WBC: 19 10*3/uL — ABNORMAL HIGH (ref 4.0–10.5)
nRBC: 0 % (ref 0.0–0.2)

## 2020-07-20 LAB — CULTURE, BAL-QUANTITATIVE W GRAM STAIN: Culture: >=100000 — AB

## 2020-07-20 LAB — HEMOGLOBIN AND HEMATOCRIT, BLOOD
HCT: 28.9 % — ABNORMAL LOW (ref 39.0–52.0)
Hemoglobin: 9.9 g/dL — ABNORMAL LOW (ref 13.0–17.0)

## 2020-07-20 LAB — APTT: aPTT: 36 seconds (ref 24–36)

## 2020-07-20 LAB — TRIGLYCERIDES: Triglycerides: 171 mg/dL — ABNORMAL HIGH (ref <150)

## 2020-07-20 LAB — PROTIME-INR
INR: 1.1 (ref 0.8–1.2)
Prothrombin Time: 14.1 seconds (ref 11.4–15.2)

## 2020-07-20 LAB — BRAIN NATRIURETIC PEPTIDE: B Natriuretic Peptide: 150.5 pg/mL — ABNORMAL HIGH (ref 0.0–100.0)

## 2020-07-20 LAB — MAGNESIUM: Magnesium: 2 mg/dL (ref 1.7–2.4)

## 2020-07-20 MED ORDER — SODIUM CHLORIDE 0.9 % IV SOLN
2.0000 g | Freq: Two times a day (BID) | INTRAVENOUS | Status: DC
  Administered 2020-07-20 – 2020-07-21 (×2): 2 g via INTRAVENOUS
  Filled 2020-07-20 (×2): qty 2

## 2020-07-20 MED ORDER — VITAL HIGH PROTEIN PO LIQD
1000.0000 mL | ORAL | Status: DC
  Administered 2020-07-20: 1000 mL

## 2020-07-20 MED ORDER — NOREPINEPHRINE 4 MG/250ML-% IV SOLN
0.0000 ug/min | INTRAVENOUS | Status: DC
  Administered 2020-07-20: 2 ug/min via INTRAVENOUS
  Filled 2020-07-20: qty 250

## 2020-07-20 MED ORDER — PANTOPRAZOLE SODIUM 40 MG IV SOLR
40.0000 mg | Freq: Two times a day (BID) | INTRAVENOUS | Status: DC
  Administered 2020-07-20 – 2020-07-21 (×4): 40 mg via INTRAVENOUS
  Filled 2020-07-20 (×4): qty 40

## 2020-07-20 MED ORDER — ALBUMIN HUMAN 5 % IV SOLN
50.0000 g | Freq: Once | INTRAVENOUS | Status: AC
  Administered 2020-07-20: 50 g via INTRAVENOUS
  Filled 2020-07-20: qty 1000

## 2020-07-20 MED ORDER — SODIUM CHLORIDE 0.9% IV SOLUTION: Freq: Once | INTRAVENOUS | Status: DC

## 2020-07-20 MED ORDER — LACTATED RINGERS IV BOLUS: 1000.0000 mL | Freq: Three times a day (TID) | INTRAVENOUS | Status: AC | PRN

## 2020-07-20 MED ORDER — NOREPINEPHRINE 16 MG/250ML-% IV SOLN
0.0000 ug/min | INTRAVENOUS | Status: DC
  Filled 2020-07-20: qty 250

## 2020-07-20 MED ORDER — PHENYLEPHRINE CONCENTRATED 100MG/250ML (0.4 MG/ML) INFUSION SIMPLE
0.0000 ug/min | INTRAVENOUS | Status: DC
  Administered 2020-07-20: 98 ug/min via INTRAVENOUS
  Administered 2020-07-21: 40 ug/min via INTRAVENOUS
  Filled 2020-07-20 (×2): qty 250

## 2020-07-20 NOTE — Progress Notes (Signed)
Pt suspected of a bleed, per CCM, MD hold CPT for 24 hours. RT will continue to monitor.

## 2020-07-20 NOTE — Progress Notes (Signed)
This RN attempted to obtain telephone consent from pts family by calling the 2 phone numbers we have listed on his chart. The first number went straight to voice mail and the second number there was no answer. Informed DR. Wert, CCM and obtained medical emergency consent from him. Timothy Shaffer

## 2020-07-20 NOTE — Progress Notes (Signed)
PT Cancellation Note  Patient Details Name: Timothy Shaffer MRN: [id number] DOB: Apr 18, 1951   Cancelled Treatment:    Reason Eval/Treat Not Completed: Medical issues which prohibited therapy, on vent and sedated. Will check back 8/16.   Claretha Cooper 07/20/2020, 6:55 AM  [place] Pager [phone] Office [phone]

## 2020-07-20 NOTE — Progress Notes (Signed)
eLink Physician-Brief Progress Note Patient Name: Mckennon Zwart DOB: 1951-03-27 MRN: [id number]   Date of Service  07/20/2020  HPI/Events of Note  Hemoglobin down to 7 gm, patient has left flank bruising , no other  Overt source of bleeding, episodic narrow complex tachycardia-ST vs SVT, saturation is unchanged from 100 %.  eICU Interventions  Stat CT abdomen pelvis ordered r/o retroperitoneal hemorrhage, type and cross, transfuse 1 unit PRBC now and keep 2 units in reserve, check electrolytes including magnesium,  Serial H and H.        Hideko Esselman U Mabry Tift 07/20/2020, 4:54 AM

## 2020-07-20 NOTE — Progress Notes (Signed)
CPT held due to stat CT.

## 2020-07-20 NOTE — Progress Notes (Signed)
Per RN Angela's request, concentrate phenylephrine drip. -drip changed to 100 mg in [place], PharmD, BCPS 07/20/2020 10:56 AM

## 2020-07-20 NOTE — Progress Notes (Signed)
RT attempted X2 this am to place art line. Unsuccessful,

## 2020-07-20 NOTE — Progress Notes (Signed)
NAME:  Timothy Shaffer, MRN:  [id number], DOB:  03-Mar-1951, LOS: 3 ADMISSION DATE:  07/17/2020, CONSULTATION DATE:  8/12 REFERRING MD:  Johney Maine, CHIEF COMPLAINT:  Tension PTX    Brief History   69 year old male admitted 8/11 for robotic colectomy. On 8/12 developed acute hypoxia in setting of spontaneous tension right PTX.   Past Medical History  Colon polyp, tobacco abuse, copd, etoh abuse prior perf duodenal ulcer   Significant Hospital Events   8/11 admitted -underwent robotic colectomy  8/12 spont right PTX. Chest tube placed 8/13 inc wob. New white out of left chest. abx started for possible pna > fob   Mucus plugs LLL   Consults:  PCCM 8/12  Procedures:  Right small bore chest tube 8/12>> R SCV CVL  8/13 >>>  Significant Diagnostic Tests:  FOB  8/13 mucus plugs RLL   Micro Data:  Resp culture 8/12  >>> MRSA   8/12  neg BAL   8/13 > amb [facility] gpcs >>>   Antimicrobials:  Cefepime  8/13>>> Vanc  8/13>>>   Scheduled Meds: . sodium chloride   Intravenous Once  . sodium chloride   Intravenous Once  . acetaminophen  1,000 mg Per Tube Q6H  . alvimopan  12 mg Oral BID  . chlorhexidine gluconate (MEDLINE KIT)  15 mL Mouth Rinse BID  . Chlorhexidine Gluconate Cloth  6 each Topical Daily  . enoxaparin (LOVENOX) injection  40 mg Subcutaneous Q24H  . feeding supplement (VITAL HIGH PROTEIN)  1,000 mL Per Tube Q24H  . folic acid  1 mg Oral Daily  . insulin aspart  0-9 Units Subcutaneous Q4H  . ipratropium-albuterol  3 mL Nebulization Q4H  . lip balm  1 application Topical BID  . mouth rinse  15 mL Mouth Rinse 10 times per day  . pantoprazole (PROTONIX) IV  40 mg Intravenous Q12H  . polyethylene glycol  17 g Oral Daily  . sodium chloride flush  10 mL Intracatheter Q8H  . sodium chloride flush  3 mL Intravenous Q12H  . sodium chloride HYPERTONIC  4 mL Nebulization Q12H  . thiamine  100 mg Intravenous Daily   Continuous Infusions: . sodium chloride    . ceFEPime  (MAXIPIME) IV Stopped (07/20/20 0331)  . norepinephrine (LEVOPHED) Adult infusion 2 mcg/min (07/20/20 0700)  . phenylephrine (NEO-SYNEPHRINE) Adult infusion 160 mcg/min (07/20/20 0839)  . propofol (DIPRIVAN) infusion 50 mcg/kg/min (07/20/20 0830)  . vancomycin Stopped (07/20/20 0057)   PRN Meds:.sodium chloride, fentaNYL (SUBLIMAZE) injection, fentaNYL (SUBLIMAZE) injection, magic mouthwash, [DISCONTINUED] ondansetron **OR** ondansetron (ZOFRAN) IV, sodium chloride flush  Interim history/subjective:  Better  p Fob/ but clearly air trapping   Objective   Blood pressure 120/61, pulse 91, temperature 99 F (37.2 C), temperature source Oral, resp. rate 19, height _0  (1.803 m), weight 62 kg, SpO2 100 %.    Vent Mode: PRVC FiO2 (%):  [80 %-100 %] 80 % Set Rate:  [24 bmp] 24 bmp Vt Set:  [600 mL] 600 mL PEEP:  [10 cmH20] 10 cmH20 Plateau Pressure:  [21 cmH20-33 cmH20] 21 cmH20   Intake/Output Summary (Last 24 hours) at 07/20/2020 0835 Last data filed at 07/20/2020 0700 Gross per 24 hour  Intake 2344.25 ml  Output 960 ml  Net 1384.25 ml   Filed Weights   07/17/20 1211 07/19/20 0500  Weight: 58.1 kg 62 kg        Examination: Tmax  100.3  Pt sedated on propofol, not breathing over vent  No  jvd Oropharynx clear,  mucosa nl Neck supple Lungs with a few scattered exp > insp rhonchi bilaterally/ min bubbling R chest tube RRR no s3 or or sign murmur Abd obese with limited  excursion  Extr warm with no edema or clubbing noted    I personally reviewed images and agree with radiology impression as follows:  CXR:   8/14 Tubes and lines as described above. Patchy left basilar infiltrate with associated effusion.       Resolved Hospital Problem list    Assessment & Plan:  Spontaneous right tension pneumothorax.  -now s/p right chest tube->PTX resolved. Suspect spont ruptured bleb Plan - resolved on cxr but air trapping on vent (see resp failure) >>> changed to water seal   Am 8/14    Acute on chronic hypoxic respiratory failure in setting of left sided atelectasis/mucous  Plugging +/- PNA.  H/o tobacco abuse  COPD   Plan Bronchodilators, decrease RR to eliminate air trapping     Circulatory shock - being transfused with prbc's this am/ still on neo/levophed but hopefully can taper off both p trx  >>> follow cvp    S/p robotic hemicolectomy Plan Per surgical team   Mild AKI-->improved w/ IVFs Lab Results  Component Value Date   CREATININE 1.17 07/20/2020   CREATININE 1.00 07/19/2020   CREATININE 1.42 (H) 07/18/2020    Plan  Cont IVFs/ monitor cvp's Serial chem Avoid hypotension   H/o ETOH Plan Cont CIWA. If intubated try prop   Hyperglycemia  Plan ssi   Mild anemia  Lab Results  Component Value Date   HGB 7.0 (L) 07/20/2020   HGB 8.0 (L) 07/19/2020   HGB 9.3 (L) 07/18/2020    Plan  Transfuse if hgb < 7 OR gets sig hypotension   Leukocytosis  Plan Trending cbc abx per flow sheet above   Best practice:  Diet: npo Pain/Anxiety/Delirium protocol (if indicated): propofol/ prn fent bolus VAP protocol (if indicated): na DVT prophylaxis: scd GI prophylaxis: ppi Glucose control: ssi Mobility:bed rest  Code Status: full code  Family Communication: per surg  Disposition: ICU  Labs   CBC: Recent Labs  Lab 07/17/20 1733 07/18/20 0502 07/18/20 1206 07/19/20 0258 07/20/20 0413  WBC 18.8* 17.7* 18.9* 21.4* 19.0*  HGB 12.5* 10.1* 9.3* 8.0* 7.0*  HCT 38.0* 30.4* 27.8* 24.1* 20.6*  MCV 110.5* 109.7* 109.9* 109.5* 109.6*  PLT 211 202 227 152 248    Basic Metabolic Panel: Recent Labs  Lab 07/17/20 1733 07/18/20 0502 07/18/20 1339 07/19/20 0258 07/20/20 0413 07/20/20 0606  NA 136 135 134* 134* 136  --   K 3.5 4.6 4.1 4.2 4.2  --   CL 104 102 104 104 106  --   CO2 _0 --   GLUCOSE 219* 222* 183* 133* 117*  --   BUN 6* _1 27*  --   CREATININE 0.97 1.56* 1.42* 1.00 1.17  --   CALCIUM 8.4* 8.6*  8.4* 8.5* 8.5*  --   MG 1.6* 1.5*  --   --   --  2.0  PHOS 3.1  --   --   --   --   --    GFR: Estimated Creatinine Clearance: 52.3 mL/min (by C-G formula based on SCr of 1.17 mg/dL). Recent Labs  Lab 07/18/20 0502 07/18/20 1206 07/19/20 0258 07/20/20 0413  WBC 17.7* 18.9* 21.4* 19.0*    Liver Function Tests: Recent Labs  Lab 07/17/20 1733 07/18/20 1339 07/19/20  0258 07/20/20 0413  AST 32 _0 ALT _1 ALKPHOS 54 40 38 41  BILITOT 0.4 0.5 0.4 0.8  PROT 5.3* 5.1* 4.6* 4.7*  ALBUMIN 3.1* 2.8* 2.5* 2.4*   No results for input(s): LIPASE, AMYLASE in the last 168 hours. No results for input(s): AMMONIA in the last 168 hours.  ABG    Component Value Date/Time   PHART 7.296 (L) 07/19/2020 1156   PCO2ART 47.6 07/19/2020 1156   PO2ART 85.8 07/19/2020 1156   HCO3 22.5 07/19/2020 1156   ACIDBASEDEF 3.5 (H) 07/19/2020 1156   O2SAT 94.6 07/19/2020 1156     Coagulation Profile: Recent Labs  Lab 07/20/20 0606  INR 1.1    Cardiac Enzymes: No results for input(s): CKTOTAL, CKMB, CKMBINDEX, TROPONINI in the last 168 hours.  HbA1C: Hgb A1c MFr Bld  Date/Time Value Ref Range Status  07/08/2020 12:01 PM 5.0 4.8 - 5.6 % Final    Comment:    (NOTE) Pre diabetes:          5.7%-6.4%  Diabetes:              >6.4%  Glycemic control for   <7.0% adults with diabetes     CBG: Recent Labs  Lab 07/19/20 1530 07/19/20 1950 07/19/20 2326 07/20/20 0303 07/20/20 0749  GLUCAP 208* 103* 89 118* 109*     The patient is critically ill with multiple organ systems failure and requires high complexity decision making for assessment and support, frequent evaluation and titration of therapies, application of advanced monitoring technologies and extensive interpretation of multiple databases. Critical Care Time devoted to patient care services described in this note is 50 minutes.

## 2020-07-20 NOTE — Progress Notes (Signed)
E-link MD contacted about pt irregular HR 80s-180s. Labs & EKG obtained & in chart. MD informed of dropping bp despite neo, Md ordered levo & started. MD also informed of dropping hgb @ 7.0. Pt transported to a STAT CT with respiratory. RN will continue to monitor

## 2020-07-20 NOTE — Progress Notes (Signed)
eLink Physician-Brief Progress Note Patient Name: Bernice Mullin DOB: 03-Jan-1951 MRN: [id number]   Date of Service  07/20/2020  HPI/Events of Note  Patient dropped his blood pressure , bleeding source not clear since CT is negative for retroperitoneal hematoma.  eICU Interventions  Albumin 5 % 1000 ml iv  Bolus, blood bank asked to make 3 units PRBC available for immediate transfusion, arterial line ordered,  Protonix bid pending r/o of GI bleeding as etiology of hypotension.        Kerry Kass Rahi Chandonnet 07/20/2020, 6:21 AM

## 2020-07-20 NOTE — Progress Notes (Signed)
OT Cancellation Note  Patient Details Name: Timothy Shaffer MRN: [id number] DOB: 1951-04-04   Cancelled Treatment:    Reason Eval/Treat Not Completed: Medical issues which prohibited therapy Patient vented and sedated. Will follow along with patient and evaluate when patient medically stable.   Charlott Calvario L Brynnan Rodenbaugh 07/20/2020, 7:53 AM

## 2020-07-20 NOTE — Progress Notes (Addendum)
Timothy Shaffer [id number] [birth year]  CARE TEAM:  PCP: Aura Dials, MD  Outpatient Care Team: Patient Care Team: Aura Dials, MD as PCP - General (Family Medicine) Michael Boston, MD as Consulting Physician (General Surgery) Wilford Corner, MD as Consulting Physician (Gastroenterology) Deliah Goody, PA-C as Physician Assistant (Family Medicine)  Inpatient Treatment Team: Treatment Team: Attending Provider: Michael Boston, MD; Technician: Ernest Mallick, NT; Consulting Physician: Pccm, Md, MD; Registered Nurse: Virl Cagey, RN; Technician: Eden Lathe, NT; Technician: Aida Raider, NT; Technician: Antoine Primas; Registered Nurse: Etta Quill, RN; Occupational Therapist: Lenward Chancellor, OT   Problem List:   Principal Problem:   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 Active Problems:   Spontaneous tension pneumothorax s/p chest tube decompression 07/18/2020   PUD (peptic ulcer disease)   Tobacco abuse   Moderate alcohol consumption   Mass of cecum   Hyperglycemia   Acute respiratory failure ([place])   Pneumonia   3 Days Post-Op  07/17/2020  POST-OPERATIVE DIAGNOSIS:   CECAL COLON POLYP UNRESECTABLE BY COLONOSCOPY Transverse colon polyp s/p polypectomy with scar  PROCEDURE:  ROBOTIC PROXIMAL HEMICOLECTOMY ROBOTIC LYSIS OF ADHESIONS X 60MIN (1/2 CASE) BILATERAL TAP BLOCK  SURGEON:  Adin Hector, MD    Assessment  Respiratory failure most likely a combination of spontaneous tension pneumothorax and COPD emphysema.  Possible pneumonia  [facility] Stay = 3 days)  Plan:  Transfuse for drift and anemia with Lovenox.  No major bleeding source.  Should help with oxygenation and soft  Pulmonary toilet.  Nebulization.  Place chest tube to waterseal since on ventilator with positive pressure.  Keep chest tube until extubated no evidence of leak  Pulmonary critical care consultation.  Discussed with PA Salvadore Dom.  They will continue to help follow.  Antibiotics and pulmonary toilet for presumed left side pneumonia related to mucous plugging.  Secretions not too severe.  Trying to wean ventilator as tolerated  Try and keep on the dry side since creatinine now normal.  Okay to diurese from my standpoint as long as we follow his urine output and creatinine closely.   Can do Lasix after transfusion if needed as needed boluses.  We will try albumin.  CIWA alcohol withdrawal protocol.  He looks much more alert today, a guardedly hopeful sign.  Folate and thiamine.  Multivitamin.  Follow-up on pathology consistent with adenomatous polyp.  No cancer.  We will do trophic low rate tube feeds since he does not have high output ileus from his NG tube.  Hold off on advancing to goal until maybe tomorrow  VTE prophylaxis- SCDs, etc    35 minutes spent in review, evaluation, examination, counseling, and coordination of care.  More than 50% of that time was spent in counseling.  07/20/2020    Subjective: (Chief complaint)  Stable on vent.  Trying to wean FiO2 down.  Patient more alert & following commands.  Nursing in room.  NG tube output rather low.  Drifting hemoglobin.  Getting transfused  Objective:  Vital signs:  Vitals:   07/20/20 0343 07/20/20 0400 07/20/20 0500 07/20/20 0744  BP:  (!) 79/60 (!) 83/65   Pulse:  95 (!) 101   Resp:  (!) 24 (!) 24   Temp:  100.1 F (37.8 C)    TempSrc:  Oral    SpO2: 100% 100% 100% 100%  Weight:      Height:        Last BM Date: 07/17/20  Intake/Output  Yesterday:  08/13 0701 - 08/14 0700 In: 1669.9 [I.V.:518.5; NG/GT:300; IV Piggyback:851.4] Out: 300 [Urine:300] This shift:  No intake/output data recorded.  Bowel function:  Flatus: No  BM:  No  Drain: (No drain)   Physical Exam:  General: Pt awake/alert in mild acute distress.  Tired but not toxic Eyes: PERRL, normal EOM.  Sclera clear.  No icterus Neuro: CN II-XII  intact w/o focal sensory/motor deficits. Lymph: No head/neck/groin lymphadenopathy Psych:  No delerium/psychosis/paranoia.  Oriented x 4 HENT: Normocephalic, Mucus membranes moist.  No thrush Neck: Supple, No tracheal deviation.  No obvious thyromegaly Chest: Coarse breath sounds bilaterally.  Chest tube in right infraclavicular space.  Mild conversational dyspnea.   CV:  Pulses intact.  Regular rhythm.  No major extremity edema MS: Normal AROM mjr joints.  No obvious deformity  Abdomen: Soft.  Nondistended.  Mildly tender at incisions only.  Dressings removed.  Incisions clean dry and intact.  No evidence of peritonitis.  No incarcerated hernias.  GU: Ecchymosis on penis and scrotum but no major edema.  Foley catheter in place.  Clear colorless yellow urine Ext:  No deformity.  No mjr edema.  No cyanosis Skin: No petechiae / purpurea.  No major sores.  Warm and dry    Results:   Cultures: Recent Results (from the past 720 hour(s))  SARS CORONAVIRUS 2 (TAT 6-24 HRS) Nasopharyngeal Nasopharyngeal Swab     Status: None   Collection Time: 07/13/20 10:15 AM   Specimen: Nasopharyngeal Swab  Result Value Ref Range Status   SARS Coronavirus 2 NEGATIVE NEGATIVE Final    Comment: (NOTE) SARS-CoV-2 target nucleic acids are NOT DETECTED.  The SARS-CoV-2 RNA is generally detectable in upper and lower respiratory specimens during the acute phase of infection. Negative results do not preclude SARS-CoV-2 infection, do not rule out co-infections with other pathogens, and should not be used as the sole basis for treatment or other patient management decisions. Negative results must be combined with clinical observations, patient history, and epidemiological information. The expected result is Negative.  Fact Sheet for Patients: SugarRoll.be  Fact Sheet for Healthcare Providers: https://www.woods-mathews.com/  This test is not yet approved or cleared  by the [place] FDA and  has been authorized for detection and/or diagnosis of SARS-CoV-2 by FDA under an Emergency Use Authorization (EUA). This EUA will remain  in effect (meaning this test can be used) for the duration of the COVID-19 declaration under Se ction 564(b)(1) of the Act, 21 U.S.C. section 360bbb-3(b)(1), unless the authorization is terminated or revoked sooner.  Performed at [facility], [place] [address], [place], [place] [address]   MRSA PCR Screening     Status: None   Collection Time: 07/18/20  1:43 PM   Specimen: Nasal Mucosa; Nasopharyngeal  Result Value Ref Range Status   MRSA by PCR NEGATIVE NEGATIVE Final    Comment:        The GeneXpert MRSA Assay (FDA approved for NASAL specimens only), is one component of a comprehensive MRSA colonization surveillance program. It is not intended to diagnose MRSA infection nor to guide or monitor treatment for MRSA infections. Performed at [facility], [place] [address] [place], [place] [address]   Culture, respiratory (non-expectorated)     Status: None (Preliminary result)   Collection Time: 07/18/20  9:16 PM   Specimen: Tracheal Aspirate; Respiratory  Result Value Ref Range Status   Specimen Description   Final    TRACHEAL ASPIRATE Performed at [facility] [place] [address] [place], [place] [address]    Special Requests   Final    NONE Performed at [facility], [place] [address], [place], [place] [address]    Gram Stain   Final    NO WBC SEEN FEW SQUAMOUS EPITHELIAL CELLS PRESENT MODERATE GRAM POSITIVE COCCI RARE GRAM POSITIVE RODS Performed at [facility], [place] [address] [place], [place] [address]    Culture PENDING  Incomplete   Report Status PENDING  Incomplete  Culture, bal-quantitative     Status: None (Preliminary result)   Collection Time: 07/19/20 12:05 PM   Specimen: Bronchoalveolar Lavage; Respiratory  Result Value Ref  Range Status   Specimen Description   Final    BRONCHIAL ALVEOLAR LAVAGE RLL Performed at [place] [address], [place], [place] [address]    Special Requests   Final    NONE Performed at [facility], [place] [address] [place], [place] [address]    Gram Stain   Final    ABUNDANT WBC PRESENT, PREDOMINANTLY PMN MODERATE GRAM POSITIVE COCCI Performed at [facility], [place] [address], [place], [place] [address]    Culture PENDING  Incomplete   Report Status PENDING  Incomplete    Labs: Results for orders placed or performed during the hospital encounter of 07/17/20 (from the past 48 hour(s))  Blood gas, arterial     Status: Abnormal   Collection Time: 07/18/20 11:36 AM  Result Value Ref Range   FIO2 21.00    Delivery systems NON-REBREATHER OXYGEN MASK    pH, Arterial 7.417 7.35 - 7.45   pCO2 arterial 31.9 (L) 32 - 48 mmHg   pO2, Arterial 42.4 (L) 83 - 108 mmHg   Bicarbonate 20.3 20.0 - 28.0 mmol/L   Acid-base deficit 3.3 (H) 0.0 - 2.0 mmol/L   O2 Saturation 77.4 %   Patient temperature 97.8    Collection site RIGHT RADIAL    Drawn by [id number]    Allens test (pass/fail) PASS PASS    Comment: Performed at [facility], [place] [address] [place], [place] [address]  CBC     Status: Abnormal   Collection Time: 07/18/20 12:06 PM  Result Value Ref Range   WBC 18.9 (H) 4.0 - 10.5 K/uL   RBC 2.53 (L) 4.22 - 5.81 MIL/uL   Hemoglobin 9.3 (L) 13.0 - 17.0 g/dL   HCT 27.8 (L) 39 - 52 %   MCV 109.9 (H) 80.0 - 100.0 fL   MCH 36.8 (H) 26.0 - 34.0 pg   MCHC 33.5 30.0 - 36.0 g/dL   RDW 12.2 11.5 - 15.5 %   Platelets 227 150 - 400 K/uL   nRBC 0.0 0.0 - 0.2 %    Comment: Performed at [facility], [place] [address] [place],  [address]  Comprehensive metabolic panel     Status: Abnormal   Collection Time: 07/18/20  1:39 PM  Result Value Ref Range   Sodium 134 (L) 135 - 145 mmol/L   Potassium 4.1 3.5 -  5.1 mmol/L   Chloride 104 98 - 111 mmol/L   CO2 22 22 - 32 mmol/L   Glucose, Bld 183 (H) 70 - 99 mg/dL    Comment: Glucose reference range applies only to samples taken after fasting for at least 8 hours.   BUN 23 8 - 23 mg/dL   Creatinine, Ser 1.42 (H) 0.61 - 1.24 mg/dL   Calcium 8.4 (  L) 8.9 - 10.3 mg/dL   Total Protein 5.1 (L) 6.5 - 8.1 g/dL   Albumin 2.8 (L) 3.5 - 5.0 g/dL   AST 27 15 - 41 U/L   ALT 18 0 - 44 U/L   Alkaline Phosphatase 40 38 - 126 U/L   Total Bilirubin 0.5 0.3 - 1.2 mg/dL   GFR calc non Af Amer 50 (L) >60 mL/min   GFR calc Af Amer 58 (L) >60 mL/min   Anion gap 8 5 - 15    Comment: Performed at [facility], [place] [address], [place], [place] [address]  MRSA PCR Screening     Status: None   Collection Time: 07/18/20  1:43 PM   Specimen: Nasal Mucosa; Nasopharyngeal  Result Value Ref Range   MRSA by PCR NEGATIVE NEGATIVE    Comment:        The GeneXpert MRSA Assay (FDA approved for NASAL specimens only), is one component of a comprehensive MRSA colonization surveillance program. It is not intended to diagnose MRSA infection nor to guide or monitor treatment for MRSA infections. Performed at [facility] [place] [address], [place], [place] [address]   Glucose, capillary     Status: Abnormal   Collection Time: 07/18/20  2:07 PM  Result Value Ref Range   Glucose-Capillary 164 (H) 70 - 99 mg/dL    Comment: Glucose reference range applies only to samples taken after fasting for at least 8 hours.  Glucose, capillary     Status: Abnormal   Collection Time: 07/18/20  3:45 PM  Result Value Ref Range   Glucose-Capillary 138 (H) 70 - 99 mg/dL    Comment: Glucose reference range applies only to samples taken after fasting for at least 8 hours.  Glucose, capillary     Status: Abnormal   Collection Time: 07/18/20  8:10 PM  Result Value Ref Range   Glucose-Capillary 161 (H) 70 - 99 mg/dL    Comment: Glucose reference range applies  only to samples taken after fasting for at least 8 hours.   Comment 1 Notify RN    Comment 2 Document in Chart   Culture, respiratory (non-expectorated)     Status: None (Preliminary result)   Collection Time: 07/18/20  9:16 PM   Specimen: Tracheal Aspirate; Respiratory  Result Value Ref Range   Specimen Description      TRACHEAL ASPIRATE Performed at [place] [address], [place], [place] [address]    Special Requests      NONE Performed at [facility], [place] [address] [place], [place] [address]    Gram Stain      NO WBC SEEN FEW SQUAMOUS EPITHELIAL CELLS PRESENT MODERATE GRAM POSITIVE COCCI RARE GRAM POSITIVE RODS Performed at [facility], [place] [address] [place], [place] [address]    Culture PENDING    Report Status PENDING   Glucose, capillary     Status: Abnormal   Collection Time: 07/18/20 11:17 PM  Result Value Ref Range   Glucose-Capillary 114 (H) 70 - 99 mg/dL    Comment: Glucose reference range applies only to samples taken after fasting for at least 8 hours.   Comment 1 Notify RN    Comment 2 Document in Chart   CBC     Status: Abnormal   Collection Time: 07/19/20  2:58 AM  Result Value Ref Range   WBC 21.4 (H) 4.0 - 10.5 K/uL   RBC 2.20 (L) 4.22 - 5.81  MIL/uL   Hemoglobin 8.0 (L) 13.0 - 17.0 g/dL   HCT 24.1 (L) 39 - 52 %   MCV 109.5 (H) 80.0 - 100.0 fL   MCH 36.4 (H) 26.0 - 34.0 pg   MCHC 33.2 30.0 - 36.0 g/dL   RDW 12.2 11.5 - 15.5 %   Platelets 152 150 - 400 K/uL   nRBC 0.0 0.0 - 0.2 %    Comment: Performed at [facility], [place] [address] [place], [place] [address]  Comprehensive metabolic panel     Status: Abnormal   Collection Time: 07/19/20  2:58 AM  Result Value Ref Range   Sodium 134 (L) 135 - 145 mmol/L   Potassium 4.2 3.5 - 5.1 mmol/L   Chloride 104 98 - 111 mmol/L   CO2 23 22 - 32 mmol/L   Glucose, Bld 133 (H) 70 - 99 mg/dL    Comment: Glucose reference range applies  only to samples taken after fasting for at least 8 hours.   BUN 22 8 - 23 mg/dL   Creatinine, Ser 1.00 0.61 - 1.24 mg/dL   Calcium 8.5 (L) 8.9 - 10.3 mg/dL   Total Protein 4.6 (L) 6.5 - 8.1 g/dL   Albumin 2.5 (L) 3.5 - 5.0 g/dL   AST 22 15 - 41 U/L   ALT 16 0 - 44 U/L   Alkaline Phosphatase 38 38 - 126 U/L   Total Bilirubin 0.4 0.3 - 1.2 mg/dL   GFR calc non Af Amer >60 >60 mL/min   GFR calc Af Amer >60 >60 mL/min   Anion gap 7 5 - 15    Comment: Performed at [facility], [place] [address], [place], [place] [address]  Glucose, capillary     Status: Abnormal   Collection Time: 07/19/20  3:39 AM  Result Value Ref Range   Glucose-Capillary 116 (H) 70 - 99 mg/dL    Comment: Glucose reference range applies only to samples taken after fasting for at least 8 hours.   Comment 1 Notify RN    Comment 2 Document in Chart   Glucose, capillary     Status: Abnormal   Collection Time: 07/19/20  7:37 AM  Result Value Ref Range   Glucose-Capillary 127 (H) 70 - 99 mg/dL    Comment: Glucose reference range applies only to samples taken after fasting for at least 8 hours.  Glucose, capillary     Status: Abnormal   Collection Time: 07/19/20 11:35 AM  Result Value Ref Range   Glucose-Capillary 176 (H) 70 - 99 mg/dL    Comment: Glucose reference range applies only to samples taken after fasting for at least 8 hours.  Draw ABG 1 hour after initiation of ventilator     Status: Abnormal   Collection Time: 07/19/20 11:56 AM  Result Value Ref Range   FIO2 100.00    Delivery systems VENTILATOR    Mode PRESSURE REGULATED VOLUME CONTROL    VT 600 mL   LHR 24 resp/min   Peep/cpap 10.0 cm H20   pH, Arterial 7.296 (L) 7.35 - 7.45   pCO2 arterial 47.6 32 - 48 mmHg   pO2, Arterial 85.8 83 - 108 mmHg   Bicarbonate 22.5 20.0 - 28.0 mmol/L   Acid-base deficit 3.5 (H) 0.0 - 2.0 mmol/L   O2 Saturation 94.6 %   Patient temperature 98.6    Collection site RIGHT RADIAL    Drawn by [id number]     Allens test (pass/fail) PASS PASS  Comment: Performed at [facility], [place] [address], [place], [place] [address]  Culture, bal-quantitative     Status: None (Preliminary result)   Collection Time: 07/19/20 12:05 PM   Specimen: Bronchoalveolar Lavage; Respiratory  Result Value Ref Range   Specimen Description      BRONCHIAL ALVEOLAR LAVAGE RLL Performed at [place] [address] [place], [place] [address]    Special Requests      NONE Performed at [facility], [place] [address], [place], [place] [address]    Gram Stain      ABUNDANT WBC PRESENT, PREDOMINANTLY PMN MODERATE GRAM POSITIVE COCCI Performed at [facility], [place] [address] [place], [place] [address]    Culture PENDING    Report Status PENDING   Glucose, capillary     Status: Abnormal   Collection Time: 07/19/20 12:29 PM  Result Value Ref Range   Glucose-Capillary 198 (H) 70 - 99 mg/dL    Comment: Glucose reference range applies only to samples taken after fasting for at least 8 hours.  Glucose, capillary     Status: Abnormal   Collection Time: 07/19/20  3:30 PM  Result Value Ref Range   Glucose-Capillary 208 (H) 70 - 99 mg/dL    Comment: Glucose reference range applies only to samples taken after fasting for at least 8 hours.  Glucose, capillary     Status: Abnormal   Collection Time: 07/19/20  7:50 PM  Result Value Ref Range   Glucose-Capillary 103 (H) 70 - 99 mg/dL    Comment: Glucose reference range applies only to samples taken after fasting for at least 8 hours.   Comment 1 Notify RN    Comment 2 Document in Chart   Glucose, capillary     Status: None   Collection Time: 07/19/20 11:26 PM  Result Value Ref Range   Glucose-Capillary 89 70 - 99 mg/dL    Comment: Glucose reference range applies only to samples taken after fasting for at least 8 hours.   Comment 1 Notify RN    Comment 2 Document in Chart   Glucose, capillary     Status:  Abnormal   Collection Time: 07/20/20  3:03 AM  Result Value Ref Range   Glucose-Capillary 118 (H) 70 - 99 mg/dL    Comment: Glucose reference range applies only to samples taken after fasting for at least 8 hours.   Comment 1 Notify RN    Comment 2 Document in Chart   Triglycerides     Status: Abnormal   Collection Time: 07/20/20  4:00 AM  Result Value Ref Range   Triglycerides 171 (H) <150 mg/dL    Comment: Performed at [facility], [place] [address] [place], [place] [address]  CBC     Status: Abnormal   Collection Time: 07/20/20  4:13 AM  Result Value Ref Range   WBC 19.0 (H) 4.0 - 10.5 K/uL   RBC 1.88 (L) 4.22 - 5.81 MIL/uL   Hemoglobin 7.0 (L) 13.0 - 17.0 g/dL   HCT 20.6 (L) 39 - 52 %   MCV 109.6 (H) 80.0 - 100.0 fL   MCH 37.2 (H) 26.0 - 34.0 pg   MCHC 34.0 30.0 - 36.0 g/dL   RDW 12.5 11.5 - 15.5 %   Platelets 180 150 - 400 K/uL   nRBC 0.0 0.0 - 0.2 %    Comment: Performed at [facility], [place] [address], [place],  [address]  Comprehensive metabolic panel     Status: Abnormal   Collection Time: 07/20/20  4:13 AM  Result Value Ref Range   Sodium 136 135 - 145 mmol/L   Potassium 4.2 3.5 - 5.1 mmol/L   Chloride 106 98 - 111 mmol/L   CO2 22 22 - 32 mmol/L   Glucose, Bld 117 (H) 70 - 99 mg/dL    Comment: Glucose reference range applies only to samples taken after fasting for at least 8 hours.   BUN 27 (H) 8 - 23 mg/dL   Creatinine, Ser 1.17 0.61 - 1.24 mg/dL   Calcium 8.5 (L) 8.9 - 10.3 mg/dL   Total Protein 4.7 (L) 6.5 - 8.1 g/dL   Albumin 2.4 (L) 3.5 - 5.0 g/dL   AST 21 15 - 41 U/L   ALT 16 0 - 44 U/L   Alkaline Phosphatase 41 38 - 126 U/L   Total Bilirubin 0.8 0.3 - 1.2 mg/dL   GFR calc non Af Amer >60 >60 mL/min   GFR calc Af Amer >60 >60 mL/min   Anion gap 8 5 - 15    Comment: Performed at [facility], [place] [address], [place], [place] [address]  Type and screen [place]      Status: None (Preliminary result)   Collection Time: 07/20/20  6:00 AM  Result Value Ref Range   ABO/RH(D) PENDING    Antibody Screen PENDING    Sample Expiration      07/23/2020,2359 Performed at [facility], [place] [address] [place], [place] [address]   Prepare RBC (crossmatch)     Status: None   Collection Time: 07/20/20  6:00 AM  Result Value Ref Range   Order Confirmation      ORDER PROCESSED BY BLOOD BANK Performed at [facility], [place] [address] [place], [place] [address]   Magnesium     Status: None   Collection Time: 07/20/20  6:06 AM  Result Value Ref Range   Magnesium 2.0 1.7 - 2.4 mg/dL    Comment: Performed at [facility], [place] [address] [place], [place] [address]  Protime-INR     Status: None   Collection Time: 07/20/20  6:06 AM  Result Value Ref Range   Prothrombin Time 14.1 11.4 - 15.2 seconds   INR 1.1 0.8 - 1.2    Comment: (NOTE) INR goal varies based on device and disease states. Performed at [facility], [place] [address], [place], [place] [address]   APTT     Status: None   Collection Time: 07/20/20  6:06 AM  Result Value Ref Range   aPTT 36 24 - 36 seconds    Comment: Performed at [facility], [place] [address] [place], [place] [address]  Brain natriuretic peptide     Status: Abnormal   Collection Time: 07/20/20  6:07 AM  Result Value Ref Range   B Natriuretic Peptide 150.5 (H) 0.0 - 100.0 pg/mL    Comment: Performed at [facility], [place] [address], [place], [place] [address]  Prepare RBC (crossmatch)     Status: None   Collection Time: 07/20/20  6:42 AM  Result Value Ref Range   Order Confirmation      ORDER PROCESSED BY BLOOD BANK Performed at [place] [address] [place], [place] [address]     Imaging / Studies: CT ABDOMEN PELVIS WO CONTRAST  Result Date: 07/20/2020 CLINICAL DATA:  Possible  retroperitoneal hematoma EXAM: CT ABDOMEN AND PELVIS WITHOUT CONTRAST TECHNIQUE: Multidetector CT imaging of the abdomen and pelvis was performed following the standard protocol without IV contrast. COMPARISON:  None. FINDINGS: Lower chest: Right chest tube is noted in place. Minimal anterior pneumothorax is noted. Mild dependent atelectatic changes are noted in the right lower lobe. Some linear areas of lucency are noted in the right middle lobe near the chest tube insertion site which may be related to the recent placement. Consolidation in the left lower lobe with associated effusion is noted similar to that seen on prior plain film examination. Hepatobiliary: No focal liver abnormality is seen. No gallstones, gallbladder wall thickening, or biliary dilatation. Pancreas: Unremarkable. No pancreatic ductal dilatation or surrounding inflammatory changes. Spleen: Normal in size without focal abnormality. Adrenals/Urinary Tract: Adrenal glands are within normal limits. Kidneys demonstrate no renal calculi or obstructive changes. Calcific changes in the left kidney are noted likely related to a partially calcified cyst. The lack of contrast limits evaluation. Foley catheter is noted decompressing the bladder. Stomach/Bowel: Postsurgical changes are noted consistent with the right hemicolectomy. In the surgical bed on the right there are scattered areas of increased attenuation likely representing postoperative hemorrhage. Some associated fluid is noted as well as multiple small bowel loops in the surgical bed. No obstructive changes are seen. The stomach is decompressed by a gastric catheter. Vascular/Lymphatic: Heavy atherosclerotic calcifications are noted. No significant lymphadenopathy is noted. Reproductive: Prostate is unremarkable. Other: Hyperdense fluid is noted within the pelvis also likely representing some postoperative hemorrhage Musculoskeletal: Mild degenerative changes of lumbar spine are noted.  Subcutaneous fluid attenuation is noted along the left flank and extending towards the left buttock. This would correspond with the bruising seen on physical exam. This is likely related to the recent surgery. Again no focal hematoma is noted. IMPRESSION: Postoperative change consistent with the right hemicolectomy. There are areas of increased attenuation in the surgical bed as well as hyperdense fluid within the pelvis consistent with mild postoperative hemorrhage. No large hematoma is seen. Some associated postoperative fluid/seroma is noted in the operative bed as well. No focal definitive collection is seen. Subcutaneous fluid attenuation in the left abdominal wall and extending into the left buttock which corresponds to the bruising seen on physical exam. These changes are likely related to the recent laparoscopic surgery. No sizable hematoma is noted. Extensive left lower lobe consolidation with associated effusion. Right chest tube placement is noted with minimal residual pneumothorax. Some areas of lucency are noted within the right middle lobe which may have been related to the placement of the chest tube. Electronically Signed   By: Inez Catalina M.D.   On: 07/20/2020 05:52   DG Chest 1 View  Result Date: 07/18/2020 CLINICAL DATA:  Low O2 sats. EXAM: CHEST  1 VIEW COMPARISON:  07/18/2020 FINDINGS: Right chest tube remains in place, unchanged. No visible right pneumothorax. Worsening airspace disease and volume loss throughout the left lung heart is normal size. No acute bony abnormality. IMPRESSION: Worsening airspace disease and volume loss throughout the left lung. No pneumothorax on the right. Electronically Signed   By: Rolm Baptise M.D.   On: 07/18/2020 21:00   DG Abd 1 View  Result Date: 07/19/2020 CLINICAL DATA:  Check gastric catheter placement EXAM: ABDOMEN - 1 VIEW COMPARISON:  None. FINDINGS: Scattered large and small bowel gas is noted. Gastric catheter is noted within the stomach.  Degenerative changes of lumbar spine are noted. Aortic calcifications are seen. IMPRESSION:  Gastric catheter within the stomach. Electronically Signed   By: Inez Catalina M.D.   On: 07/19/2020 12:29   DG Chest Port 1 View  Result Date: 07/20/2020 CLINICAL DATA:  Follow-up right chest tube EXAM: PORTABLE CHEST 1 VIEW COMPARISON:  07/20/2019 FINDINGS: Cardiac shadow is stable. Endotracheal tube and gastric catheter are noted in satisfactory position. Pigtail catheter is noted on the right. Patchy infiltrate and effusion is noted in the left base slightly increased when compared with the prior exam. Right subclavian central line is seen in satisfactory position. No residual pneumothorax is seen. IMPRESSION: Tubes and lines as described above. Patchy left basilar infiltrate with associated effusion. Electronically Signed   By: Inez Catalina M.D.   On: 07/20/2020 05:53   DG CHEST PORT 1 VIEW  Result Date: 07/19/2020 CLINICAL DATA:  Follow-up intubation EXAM: PORTABLE CHEST 1 VIEW COMPARISON:  07/19/2020 FINDINGS: Endotracheal tube and gastric catheter are noted in satisfactory position. Right subclavian central venous line is noted at the cavoatrial junction. Pigtail catheter on the right is noted. No pneumothorax is seen. Diffuse airspace opacity is noted again on the left stable from the prior study. IMPRESSION: Stable appearance of the chest. Interval tube and line placement as described in satisfactory position. Electronically Signed   By: Inez Catalina M.D.   On: 07/19/2020 12:29   DG Chest Port 1 View  Result Date: 07/19/2020 CLINICAL DATA:  Acute respiratory failure. EXAM: PORTABLE CHEST 1 VIEW COMPARISON:  One-view chest x-ray 8/12/1 FINDINGS: Heart is obscured by left-sided airspace disease. Atherosclerotic changes are noted at the arch. Diffuse left-sided airspace disease has increased. Left pleural effusion again noted. Right-sided chest tube is in place. No pneumothorax is present. Right-sided rib  fractures are stable. The right lung is clear. IMPRESSION: 1. Increasing diffuse left-sided airspace disease compatible with pneumonia. 2. Left pleural effusion. 3. Right-sided chest tube without pneumothorax. Electronically Signed   By: San Morelle M.D.   On: 07/19/2020 05:45   DG CHEST PORT 1 VIEW  Result Date: 07/18/2020 CLINICAL DATA:  Status post right chest tube placement for pneumothorax. EXAM: PORTABLE CHEST 1 VIEW COMPARISON:  Single-view of the chest earlier today. FINDINGS: Pigtail catheter is now in place in right chest. Right pneumothorax has almost completely resolved. Only small apical pneumothorax is now seen. Volume loss in the left chest and left basilar airspace disease are again seen. There is shift of the mediastinal contents to the left which is unchanged. The patient is rotated to the right on the study IMPRESSION: Near complete resolution of right pneumothorax after chest tube placement. The patient is somewhat rotated on the study but there is persistent shift of the mediastinal contents to the left which may be chronic. Persistent left basilar airspace disease which may be due to atelectasis given mediastinal shift. Electronically Signed   By: Inge Rise M.D.   On: 07/18/2020 13:37   DG Chest Port 1 View  Result Date: 07/18/2020 CLINICAL DATA:  Respiratory distress. History of recent robotic colectomy. EXAM: PORTABLE CHEST 1 VIEW COMPARISON:  None. FINDINGS: There is a moderate-sized right-sided tension pneumothorax. The pneumothorax is estimated at 25%. There is shift of the heart and mediastinum to the left including tracheal deviation. Associated loss of volume in the left lung with areas of atelectasis. IMPRESSION: Right-sided tension pneumothorax as above. These results were called by telephone at the time of interpretation on 07/18/2020 at 12:11 pm to the patient's nurse, Caryl Pina, who verbally acknowledged these results. Electronically Signed  By: Marijo Sanes  M.D.   On: 07/18/2020 12:13    Medications / Allergies: per chart  Antibiotics: Anti-infectives (From admission, onward)   Start     Dose/Rate Route Frequency Ordered Stop   07/20/20 0000  vancomycin (VANCOREADY) IVPB 750 mg/150 mL     Discontinue     750 mg 150 mL/hr over 60 Minutes Intravenous Every 12 hours 07/19/20 1021     07/19/20 1200  ceFEPIme (MAXIPIME) 2 g in sodium chloride 0.9 % 100 mL IVPB     Discontinue     2 g 200 mL/hr over 30 Minutes Intravenous Every 8 hours 07/19/20 1021     07/19/20 1100  vancomycin (VANCOCIN) IVPB 1000 mg/200 mL premix        1,000 mg 200 mL/hr over 60 Minutes Intravenous  Once 07/19/20 1021 07/19/20 1416   07/18/20 0100  cefoTEtan (CEFOTAN) 2 g in sodium chloride 0.9 % 100 mL IVPB        2 g 200 mL/hr over 30 Minutes Intravenous Every 12 hours 07/17/20 1721 07/18/20 1029   07/17/20 1400  neomycin (MYCIFRADIN) tablet 1,000 mg  Status:  Discontinued       "And" Linked Group Details   1,000 mg Oral 3 times per day 07/17/20 1207 07/17/20 1723   07/17/20 1400  metroNIDAZOLE (FLAGYL) tablet 1,000 mg  Status:  Discontinued       "And" Linked Group Details   1,000 mg Oral 3 times per day 07/17/20 1207 07/17/20 1723   07/17/20 1215  cefoTEtan (CEFOTAN) 2 g in sodium chloride 0.9 % 100 mL IVPB        2 g 200 mL/hr over 30 Minutes Intravenous On call to O.R. 07/17/20 1207 07/17/20 1312        Note: Portions of this report may have been transcribed using voice recognition software. Every effort was made to ensure accuracy; however, inadvertent computerized transcription errors may be present.   Any transcriptional errors that result from this process are unintentional.    Adin Hector, MD, FACS, MASCRS Gastrointestinal and Minimally Invasive Surgery  [facility] [address] [place], [place] [address] [phone] Fax [phone] Main/Paging  CONTACT INFORMATION: Weekday (9AM-5PM) concerns: Call [facility] main  office at [phone] Weeknight (5PM-9AM) or Weekend/Holiday concerns: Check [email or web address] for General Surgery [facility] coverage (Please, do not use SecureChat as it is not reliable communication to operating surgeons for immediate patient care)      07/20/2020  7:44 AM

## 2020-07-21 DIAGNOSIS — J930 Spontaneous tension pneumothorax: Secondary | ICD-10-CM | POA: Diagnosis not present

## 2020-07-21 DIAGNOSIS — J9601 Acute respiratory failure with hypoxia: Secondary | ICD-10-CM | POA: Diagnosis not present

## 2020-07-21 DIAGNOSIS — J9602 Acute respiratory failure with hypercapnia: Secondary | ICD-10-CM | POA: Diagnosis not present

## 2020-07-21 DIAGNOSIS — J189 Pneumonia, unspecified organism: Secondary | ICD-10-CM | POA: Diagnosis not present

## 2020-07-21 LAB — TYPE AND SCREEN
ABO/RH(D): A POS
Antibody Screen: NEGATIVE
Unit division: 0
Unit division: 0
Unit division: 0
Unit division: 0
Unit division: 0

## 2020-07-21 LAB — CULTURE, RESPIRATORY W GRAM STAIN
Culture: NORMAL
Gram Stain: NONE SEEN

## 2020-07-21 LAB — GLUCOSE, CAPILLARY
Glucose-Capillary: 96 mg/dL (ref 70–99)
Glucose-Capillary: 102 mg/dL — ABNORMAL HIGH (ref 70–99)
Glucose-Capillary: 97 mg/dL (ref 70–99)
Glucose-Capillary: 122 mg/dL — ABNORMAL HIGH (ref 70–99)
Glucose-Capillary: 70 mg/dL (ref 70–99)

## 2020-07-21 LAB — CBC
HCT: 29 % — ABNORMAL LOW (ref 39.0–52.0)
Hemoglobin: 9.9 g/dL — ABNORMAL LOW (ref 13.0–17.0)
MCH: 34.3 pg — ABNORMAL HIGH (ref 26.0–34.0)
MCHC: 34.1 g/dL (ref 30.0–36.0)
MCV: 100.3 fL — ABNORMAL HIGH (ref 80.0–100.0)
Platelets: 170 10*3/uL (ref 150–400)
RBC: 2.89 MIL/uL — ABNORMAL LOW (ref 4.22–5.81)
RDW: 17.4 % — ABNORMAL HIGH (ref 11.5–15.5)
WBC: 16.1 10*3/uL — ABNORMAL HIGH (ref 4.0–10.5)
nRBC: 0.1 % (ref 0.0–0.2)

## 2020-07-21 LAB — BPAM RBC
Blood Product Expiration Date: 202109062359
Blood Product Expiration Date: 202109062359
Blood Product Expiration Date: 202109062359
Blood Product Expiration Date: 202109062359
Blood Product Expiration Date: 202109062359
ISSUE DATE / TIME: 202108140812
ISSUE DATE / TIME: 202108141020
ISSUE DATE / TIME: 202108141321
Unit Type and Rh: 6200
Unit Type and Rh: 6200
Unit Type and Rh: 6200
Unit Type and Rh: 6200
Unit Type and Rh: 6200

## 2020-07-21 LAB — BASIC METABOLIC PANEL
Anion gap: 8 (ref 5–15)
BUN: 22 mg/dL (ref 8–23)
CO2: 20 mmol/L — ABNORMAL LOW (ref 22–32)
Calcium: 8.6 mg/dL — ABNORMAL LOW (ref 8.9–10.3)
Chloride: 112 mmol/L — ABNORMAL HIGH (ref 98–111)
Creatinine, Ser: 0.87 mg/dL (ref 0.61–1.24)
GFR calc Af Amer: >60 mL/min (ref >60)
GFR calc non Af Amer: >60 mL/min (ref >60)
Glucose, Bld: 113 mg/dL — ABNORMAL HIGH (ref 70–99)
Potassium: 3.4 mmol/L — ABNORMAL LOW (ref 3.5–5.1)
Sodium: 140 mmol/L (ref 135–145)

## 2020-07-21 LAB — TRIGLYCERIDES: Triglycerides: 201 mg/dL — ABNORMAL HIGH (ref <150)

## 2020-07-21 MED ORDER — IPRATROPIUM-ALBUTEROL 0.5-2.5 (3) MG/3ML IN SOLN
3.0000 mL | Freq: Three times a day (TID) | RESPIRATORY_TRACT | Status: DC
  Administered 2020-07-22 – 2020-07-23 (×4): 3 mL via RESPIRATORY_TRACT
  Filled 2020-07-21 (×4): qty 3

## 2020-07-21 MED ORDER — ACETAMINOPHEN 500 MG PO TABS
1000.0000 mg | ORAL_TABLET | Freq: Four times a day (QID) | ORAL | Status: DC
  Administered 2020-07-22 – 2020-07-24 (×9): 1000 mg via ORAL
  Filled 2020-07-21 (×10): qty 2

## 2020-07-21 MED ORDER — VITAL HIGH PROTEIN PO LIQD: 1000.0000 mL | ORAL | Status: DC

## 2020-07-21 MED ORDER — CHLORHEXIDINE GLUCONATE 0.12% ORAL RINSE (MEDLINE KIT): 15.0000 mL | Freq: Two times a day (BID) | OROMUCOSAL | Status: DC

## 2020-07-21 MED ORDER — ORAL CARE MOUTH RINSE
15.0000 mL | OROMUCOSAL | Status: DC
  Administered 2020-07-21 (×3): 15 mL via OROMUCOSAL

## 2020-07-21 MED ORDER — SODIUM CHLORIDE 0.9 % IV SOLN
2.0000 g | Freq: Three times a day (TID) | INTRAVENOUS | Status: DC
  Administered 2020-07-21 – 2020-07-24 (×9): 2 g via INTRAVENOUS
  Filled 2020-07-21 (×11): qty 2

## 2020-07-21 NOTE — Progress Notes (Signed)
Initial Nutrition Assessment  DOCUMENTATION CODES:   Not applicable  INTERVENTION:   -D/c Vital High Protein; pt extubated and no longer with feeding access -RD will follow for diet advancement and add supplements as appropriate  NUTRITION DIAGNOSIS:   Increased nutrient needs related to post-op healing as evidenced by estimated needs.  GOAL:   Patient will meet greater than or equal to 90% of their needs  MONITOR:   Diet advancement, Labs, Weight trends, Skin, I & O's  REASON FOR ASSESSMENT:   Consult, Ventilator Enteral/tube feeding initiation and management  ASSESSMENT:   The patient is a active smoking gentleman who is followed by Dr. Sheryn Bison.  Had declined colonoscopy offerred in 2009 age 69 in the past.  Had a positive colon Fit test.  Sent for endoscopy.  Numerous colon polyps found.  Small ones and rectosigmoid region removed.  Large bulky mass in cecum biopsied.  Felt not to be amenable to endoscopic resection.  Pathology consistent with adenomatous polyp.  Surgical consultation recommended by Dr. Michail Sermon to consider segmental resection.   8/11- s/p ROBOTIC PROXIMAL HEMICOLECTOMY; ROBOTIC LYSIS OF ADHESIONS X 60MIN (1/2 CASE); BILATERAL TAP BLOCK 8/12- rt chest tube placed secondary to pneumothorax 8/13- intubated 8/14- TF initiated 8/15- extubated  Reviewed I/O's: +2.1 L x 24 hours and +6 L since admission  UOP: 1.6 L x 24 hours  NGT output: 0 ml x 24 hours  Chest tube output: 0 ml x 24 hours  Pt extubated this AM per respiratory therapy. TF held due to extubation and no current feeding access.   Medications reviewed and include miralax, and vitamin B-1.   Labs reviewed: K: 3.4, CBGS: 90-96 (inpatient orders for glycemic control are 0-9 units inuslin aspart every 4 hours).   Diet Order:   Diet Order            Diet - low sodium heart healthy                 EDUCATION NEEDS:   No education needs have been identified at this time  Skin:  Skin  Assessment: Skin Integrity Issues: Skin Integrity Issues:: Incisions, Other (Comment) Incisions: closed abdomen Other: lt foot fungal infection  Last BM:  07/17/20  Height:   Ht Readings from Last 1 Encounters:  07/17/20 5\' 11"  (1.803 m)    Weight:   Wt Readings from Last 1 Encounters:  07/21/20 64 kg    Ideal Body Weight:  78.2 kg  BMI:  Body mass index is 19.68 kg/m.  Estimated Nutritional Needs:   Kcal:  2050-2250  Protein:  110-125 grams  Fluid:  > 2 L    Loistine Chance, RD, LDN, [place] Registered Dietitian II Certified Diabetes Care and Education Specialist Please refer to [facility] for RD and/or RD on-call/weekend/after hours pager

## 2020-07-21 NOTE — Progress Notes (Signed)
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 Active Problems:   Spontaneous tension pneumothorax s/p chest tube decompression 07/18/2020   PUD (peptic ulcer disease)   Tobacco abuse   Moderate alcohol consumption   Mass of cecum   Hyperglycemia   Acute respiratory failure ([place])   Pneumonia   Shock circulatory ([place])   4 Days Post-Op  07/17/2020  POST-OPERATIVE DIAGNOSIS:   CECAL COLON POLYP UNRESECTABLE BY COLONOSCOPY Transverse colon polyp s/p polypectomy with scar  PROCEDURE:  ROBOTIC PROXIMAL HEMICOLECTOMY ROBOTIC LYSIS OF ADHESIONS X 60MIN (1/2 CASE) BILATERAL TAP BLOCK  SURGEON:  Adin Hector, MD    Assessment  Respiratory failure most likely a combination of spontaneous tension pneumothorax and COPD emphysema.  Possible pneumonia  [facility] Stay = 4 days)  Plan:  Hemoglobin stabilized with transfusion.  Advance tube feeds to goal.  Diuresis as tolerated.  Chest tube to waterseal since on ventilator with positive pressure.  Keep chest tube until extubated no evidence of leak  Pulmonary critical care help appreciated  Antibiotics and pulmonary toilet for presumed left side pneumonia related to mucous plugging.  Secretions not too severe.  Trying to wean ventilator as tolerated  Try and keep on the dry side since  creatinine now normal.  Okay to diurese from my standpoint as long as we follow his urine output and creatinine closely.   Can do Lasix after transfusion if needed as needed boluses.  We will try albumin.  CIWA alcohol withdrawal protocol.  He looks much more alert today, a guardedly hopeful sign.  Folate and thiamine.  Multivitamin.  Follow-up on pathology consistent with adenomatous polyp.  No cancer.  We will do trophic low rate tube feeds since he does not have high output ileus from his NG tube.  Hold off on advancing to goal until maybe tomorrow  VTE prophylaxis- SCDs, etc    35 minutes spent in review, evaluation, examination, counseling, and coordination of care.  More than 50% of that time was spent in counseling.  07/21/2020    Subjective: (Chief complaint)  Transfused.  Was on pressors.  Nearly weaned off.  Tolerating tube feeds.  Weaning vent.  Objective:  Vital signs:  Vitals:   07/21/20 0515 07/21/20 0530 07/21/20 0545 07/21/20 0600  BP: 110/60 126/74 111/85 128/77  Pulse: 86 85 83 83  Resp: (!) 21 (!) 22 (!) 22 20  Temp:      TempSrc:      SpO2: 100% 100% 100% 100%  Weight:      Height:        Last BM Date: 07/17/20  Intake/Output   Yesterday:  08/14 0701 - 08/15 0700 In: 3713.7 [I.V.:1721.6; Blood:1099.3; NG/GT:410; IV Piggyback:482.8] Out: 3734 [Urine:1625; Emesis/NG output:10] This shift:  Total I/O In: 785 [I.V.:387.2; NG/GT:60; IV Piggyback:337.7] Out: 800 [Urine:800]  Bowel function:  Flatus: No  BM:  No  Drain: (No drain)   Physical Exam:  General: Patient is resting but awake  and more alert.  Following commands.  Not agitated.  mild acute distress.  Tired but not toxic Eyes: PERRL, normal EOM.  Sclera clear.  No icterus Neuro: CN II-XII intact w/o focal sensory/motor deficits. Lymph: No head/neck/groin lymphadenopathy Psych:  No delerium/psychosis/paranoia.  Oriented x 4 HENT: Normocephalic, Mucus membranes moist.  No  thrush Neck: Supple, No tracheal deviation.  No obvious thyromegaly Chest: Coarse breath sounds bilaterally.  Chest tube in right infraclavicular space.  Mild conversational dyspnea.   CV:  Pulses intact.  Regular rhythm.  No major extremity edema MS: Normal AROM mjr joints.  No obvious deformity  Abdomen: Soft.  Nondistended.  Mildly tender at incisions only.  Dressings removed.  Incisions clean dry and intact.  No evidence of peritonitis.  No incarcerated hernias.  GU: Ecchymosis on penis and scrotum going to left flank stable and not worse.  But no major edema.  Foley catheter in place.  Clear colorless yellow urine Ext:  No deformity.  No mjr edema.  No cyanosis Skin: No petechiae / purpurea.  No major sores.  Warm and dry    Results:   Cultures: Recent Results (from the past 720 hour(s))  SARS CORONAVIRUS 2 (TAT 6-24 HRS) Nasopharyngeal Nasopharyngeal Swab     Status: None   Collection Time: 07/13/20 10:15 AM   Specimen: Nasopharyngeal Swab  Result Value Ref Range Status   SARS Coronavirus 2 NEGATIVE NEGATIVE Final    Comment: (NOTE) SARS-CoV-2 target nucleic acids are NOT DETECTED.  The SARS-CoV-2 RNA is generally detectable in upper and lower respiratory specimens during the acute phase of infection. Negative results do not preclude SARS-CoV-2 infection, do not rule out co-infections with other pathogens, and should not be used as the sole basis for treatment or other patient management decisions. Negative results must be combined with clinical observations, patient history, and epidemiological information. The expected result is Negative.  Fact Sheet for Patients: SugarRoll.be  Fact Sheet for Healthcare Providers: https://www.woods-mathews.com/  This test is not yet approved or cleared by the [place] FDA and  has been authorized for detection and/or diagnosis of SARS-CoV-2 by FDA under an Emergency Use Authorization  (EUA). This EUA will remain  in effect (meaning this test can be used) for the duration of the COVID-19 declaration under Se ction 564(b)(1) of the Act, 21 U.S.C. section 360bbb-3(b)(1), unless the authorization is terminated or revoked sooner.  Performed at [facility], [place] [address] [place], [place] [address]   MRSA PCR Screening     Status: None   Collection Time: 07/18/20  1:43 PM   Specimen: Nasal Mucosa; Nasopharyngeal  Result Value Ref Range Status   MRSA by PCR NEGATIVE NEGATIVE Final    Comment:        The GeneXpert MRSA Assay (FDA approved for NASAL specimens only), is one component of a comprehensive MRSA colonization surveillance program. It is not intended to diagnose MRSA infection nor to guide or monitor treatment for MRSA infections. Performed at [facility], [place] [address], [place], [place] [address]   Culture, respiratory (non-expectorated)     Status: None (Preliminary result)   Collection Time: 07/18/20  9:16 PM   Specimen: Tracheal Aspirate; Respiratory  Result Value Ref Range Status   Specimen Description   Final    TRACHEAL ASPIRATE Performed at [place] [address], [place], [place] [address]    Special Requests   Final    NONE Performed at [facility],  [place] [address], [place], [place] [address]    Gram Stain   Final    NO WBC SEEN FEW SQUAMOUS EPITHELIAL CELLS PRESENT MODERATE GRAM POSITIVE COCCI RARE GRAM POSITIVE RODS    Culture   Final    CULTURE REINCUBATED FOR BETTER GROWTH Performed at [facility], [place] [address], [place], [place] [address]    Report Status PENDING  Incomplete  Culture, bal-quantitative     Status: Abnormal   Collection Time: 07/19/20 12:05 PM   Specimen: Bronchoalveolar Lavage; Respiratory  Result Value Ref Range Status   Specimen Description   Final    BRONCHIAL ALVEOLAR LAVAGE RLL Performed at [place] [address] [place], [place] [address]    Special Requests   Final    NONE Performed at [facility], [place] [address] [place], [place] [address]    Gram Stain   Final    ABUNDANT WBC PRESENT, PREDOMINANTLY PMN MODERATE GRAM POSITIVE COCCI    Culture (A)  Final    >=100,000 COLONIES/mL Consistent with normal respiratory flora. Performed at [facility], [place] [address], [place], [place] [address]    Report Status 07/20/2020 FINAL  Final    Labs: Results for orders placed or performed during the hospital encounter of 07/17/20 (from the past 48 hour(s))  Glucose, capillary     Status: Abnormal   Collection Time: 07/19/20  7:37 AM  Result Value Ref Range   Glucose-Capillary 127 (H) 70 - 99 mg/dL    Comment: Glucose reference range applies only to samples taken after fasting for at least 8 hours.  Glucose, capillary     Status: Abnormal   Collection Time: 07/19/20 11:35 AM  Result Value Ref Range   Glucose-Capillary 176 (H) 70 - 99 mg/dL    Comment: Glucose reference range applies only to samples taken after fasting for at least 8 hours.  Draw ABG 1 hour after initiation of ventilator     Status: Abnormal   Collection Time: 07/19/20 11:56 AM  Result Value Ref Range   FIO2 100.00    Delivery systems VENTILATOR    Mode PRESSURE REGULATED VOLUME CONTROL    VT 600 mL   LHR 24 resp/min   Peep/cpap 10.0 cm H20   pH, Arterial 7.296 (L) 7.35 - 7.45   pCO2 arterial 47.6 32 - 48 mmHg   pO2, Arterial 85.8 83 - 108 mmHg   Bicarbonate 22.5 20.0 - 28.0 mmol/L   Acid-base deficit 3.5 (H) 0.0 - 2.0 mmol/L   O2 Saturation 94.6 %   Patient temperature 98.6    Collection site RIGHT RADIAL    Drawn by [id number]    Allens test (pass/fail) PASS PASS    Comment: Performed at [facility], [place] [address], [place], [place] [address]  Culture, bal-quantitative     Status: Abnormal   Collection Time: 07/19/20 12:05 PM   Specimen: Bronchoalveolar Lavage;  Respiratory  Result Value Ref Range   Specimen Description      BRONCHIAL ALVEOLAR LAVAGE RLL Performed at [place] [address] [place], [place] [address]    Special Requests      NONE Performed at [facility], [place] [address], [place], [place] [address]    Gram Stain      ABUNDANT WBC PRESENT, PREDOMINANTLY PMN MODERATE GRAM POSITIVE COCCI    Culture (A)     >=100,000 COLONIES/mL Consistent with normal respiratory flora. Performed  at [facility], [place] [address], [place], [place] [address]    Report Status 07/20/2020 FINAL   Glucose, capillary     Status: Abnormal   Collection Time: 07/19/20 12:29 PM  Result Value Ref Range   Glucose-Capillary 198 (H) 70 - 99 mg/dL    Comment: Glucose reference range applies only to samples taken after fasting for at least 8 hours.  Glucose, capillary     Status: Abnormal   Collection Time: 07/19/20  3:30 PM  Result Value Ref Range   Glucose-Capillary 208 (H) 70 - 99 mg/dL    Comment: Glucose reference range applies only to samples taken after fasting for at least 8 hours.  Glucose, capillary     Status: Abnormal   Collection Time: 07/19/20  7:50 PM  Result Value Ref Range   Glucose-Capillary 103 (H) 70 - 99 mg/dL    Comment: Glucose reference range applies only to samples taken after fasting for at least 8 hours.   Comment 1 Notify RN    Comment 2 Document in Chart   Glucose, capillary     Status: None   Collection Time: 07/19/20 11:26 PM  Result Value Ref Range   Glucose-Capillary 89 70 - 99 mg/dL    Comment: Glucose reference range applies only to samples taken after fasting for at least 8 hours.   Comment 1 Notify RN    Comment 2 Document in Chart   Glucose, capillary     Status: Abnormal   Collection Time: 07/20/20  3:03 AM  Result Value Ref Range   Glucose-Capillary 118 (H) 70 - 99 mg/dL    Comment: Glucose reference range applies only to samples taken after fasting for at  least 8 hours.   Comment 1 Notify RN    Comment 2 Document in Chart   Triglycerides     Status: Abnormal   Collection Time: 07/20/20  4:00 AM  Result Value Ref Range   Triglycerides 171 (H) <150 mg/dL    Comment: Performed at [facility], [place] [address] [place], [place] [address]  CBC     Status: Abnormal   Collection Time: 07/20/20  4:13 AM  Result Value Ref Range   WBC 19.0 (H) 4.0 - 10.5 K/uL   RBC 1.88 (L) 4.22 - 5.81 MIL/uL   Hemoglobin 7.0 (L) 13.0 - 17.0 g/dL   HCT 20.6 (L) 39 - 52 %   MCV 109.6 (H) 80.0 - 100.0 fL   MCH 37.2 (H) 26.0 - 34.0 pg   MCHC 34.0 30.0 - 36.0 g/dL   RDW 12.5 11.5 - 15.5 %   Platelets 180 150 - 400 K/uL   nRBC 0.0 0.0 - 0.2 %    Comment: Performed at [facility], [place] [address] [place], [place] [address]  Comprehensive metabolic panel     Status: Abnormal   Collection Time: 07/20/20  4:13 AM  Result Value Ref Range   Sodium 136 135 - 145 mmol/L   Potassium 4.2 3.5 - 5.1 mmol/L   Chloride 106 98 - 111 mmol/L   CO2 22 22 - 32 mmol/L   Glucose, Bld 117 (H) 70 - 99 mg/dL    Comment: Glucose reference range applies only to samples taken after fasting for at least 8 hours.   BUN 27 (H) 8 - 23 mg/dL   Creatinine, Ser 1.17 0.61 - 1.24 mg/dL   Calcium 8.5 (L) 8.9 - 10.3 mg/dL   Total Protein 4.7 (L) 6.5 -  8.1 g/dL   Albumin 2.4 (L) 3.5 - 5.0 g/dL   AST 21 15 - 41 U/L   ALT 16 0 - 44 U/L   Alkaline Phosphatase 41 38 - 126 U/L   Total Bilirubin 0.8 0.3 - 1.2 mg/dL   GFR calc non Af Amer >60 >60 mL/min   GFR calc Af Amer >60 >60 mL/min   Anion gap 8 5 - 15    Comment: Performed at [facility], [place] [address] [place], [place] [address]  Type and screen [place]     Status: None   Collection Time: 07/20/20  6:00 AM  Result Value Ref Range   ABO/RH(D) A POS    Antibody Screen NEG    Sample Expiration      07/23/2020,2359 Performed at [facility],  [place] [address] [place], [place] [address]   Prepare RBC (crossmatch)     Status: None   Collection Time: 07/20/20  6:00 AM  Result Value Ref Range   Order Confirmation      ORDER PROCESSED BY BLOOD BANK Performed at [facility], [place] [address] [place], [place] [address]   Magnesium     Status: None   Collection Time: 07/20/20  6:06 AM  Result Value Ref Range   Magnesium 2.0 1.7 - 2.4 mg/dL    Comment: Performed at [facility], [place] [address], [place], [place] [address]  Protime-INR     Status: None   Collection Time: 07/20/20  6:06 AM  Result Value Ref Range   Prothrombin Time 14.1 11.4 - 15.2 seconds   INR 1.1 0.8 - 1.2    Comment: (NOTE) INR goal varies based on device and disease states. Performed at [facility], [place] [address] [place], [place] [address]   APTT     Status: None   Collection Time: 07/20/20  6:06 AM  Result Value Ref Range   aPTT 36 24 - 36 seconds    Comment: Performed at [facility], [place] [address], [place], [place] [address]  Brain natriuretic peptide     Status: Abnormal   Collection Time: 07/20/20  6:07 AM  Result Value Ref Range   B Natriuretic Peptide 150.5 (H) 0.0 - 100.0 pg/mL    Comment: Performed at [facility], [place] [address] [place], [place] [address]  Prepare RBC (crossmatch)     Status: None   Collection Time: 07/20/20  6:42 AM  Result Value Ref Range   Order Confirmation      BB SAMPLE OR UNITS ALREADY AVAILABLE Performed at [place] [address], [place], [place] [address]   Glucose, capillary     Status: Abnormal   Collection Time: 07/20/20  7:49 AM  Result Value Ref Range   Glucose-Capillary 109 (H) 70 - 99 mg/dL    Comment: Glucose reference range applies only to samples taken after fasting for at least 8 hours.   Comment 1 Notify RN    Comment 2 Document in Chart   Glucose, capillary     Status: None    Collection Time: 07/20/20 12:38 PM  Result Value Ref Range   Glucose-Capillary 99 70 - 99 mg/dL    Comment: Glucose reference range applies only to samples taken after fasting for at least 8 hours.   Comment 1 Notify RN    Comment 2 Document in Chart   Glucose, capillary  Status: Abnormal   Collection Time: 07/20/20  3:45 PM  Result Value Ref Range   Glucose-Capillary 100 (H) 70 - 99 mg/dL    Comment: Glucose reference range applies only to samples taken after fasting for at least 8 hours.   Comment 1 Notify RN    Comment 2 Document in Chart   Hemoglobin and hematocrit, blood     Status: Abnormal   Collection Time: 07/20/20  5:00 PM  Result Value Ref Range   Hemoglobin 9.9 (L) 13.0 - 17.0 g/dL    Comment: REPEATED TO VERIFY   HCT 28.9 (L) 39 - 52 %    Comment: Performed at [facility], [place] [address], [place], [place] [address]  Glucose, capillary     Status: Abnormal   Collection Time: 07/20/20  7:30 PM  Result Value Ref Range   Glucose-Capillary 104 (H) 70 - 99 mg/dL    Comment: Glucose reference range applies only to samples taken after fasting for at least 8 hours.   Comment 1 Notify RN    Comment 2 Document in Chart   Glucose, capillary     Status: None   Collection Time: 07/20/20 11:19 PM  Result Value Ref Range   Glucose-Capillary 90 70 - 99 mg/dL    Comment: Glucose reference range applies only to samples taken after fasting for at least 8 hours.   Comment 1 Notify RN    Comment 2 Document in Chart   Glucose, capillary     Status: None   Collection Time: 07/21/20  3:19 AM  Result Value Ref Range   Glucose-Capillary 96 70 - 99 mg/dL    Comment: Glucose reference range applies only to samples taken after fasting for at least 8 hours.   Comment 1 Notify RN    Comment 2 Document in Chart   Triglycerides     Status: Abnormal   Collection Time: 07/21/20  4:30 AM  Result Value Ref Range   Triglycerides 201 (H) <150 mg/dL    Comment: Performed at  [facility], [place] [address], [place], [place] [address]  CBC     Status: Abnormal   Collection Time: 07/21/20  4:39 AM  Result Value Ref Range   WBC 16.1 (H) 4.0 - 10.5 K/uL   RBC 2.89 (L) 4.22 - 5.81 MIL/uL   Hemoglobin 9.9 (L) 13.0 - 17.0 g/dL   HCT 29.0 (L) 39 - 52 %   MCV 100.3 (H) 80.0 - 100.0 fL    Comment: REPEATED TO VERIFY DELTA CHECK NOTED    MCH 34.3 (H) 26.0 - 34.0 pg   MCHC 34.1 30.0 - 36.0 g/dL   RDW 17.4 (H) 11.5 - 15.5 %   Platelets 170 150 - 400 K/uL   nRBC 0.1 0.0 - 0.2 %    Comment: Performed at [facility], [place] [address], [place], [place] [address]  Basic metabolic panel     Status: Abnormal   Collection Time: 07/21/20  4:39 AM  Result Value Ref Range   Sodium 140 135 - 145 mmol/L   Potassium 3.4 (L) 3.5 - 5.1 mmol/L    Comment: DELTA CHECK NOTED   Chloride 112 (H) 98 - 111 mmol/L   CO2 20 (L) 22 - 32 mmol/L   Glucose, Bld 113 (H) 70 - 99 mg/dL    Comment: Glucose reference range applies only to samples taken after fasting for at least 8 hours.   BUN 22 8 - 23 mg/dL  Creatinine, Ser 0.87 0.61 - 1.24 mg/dL   Calcium 8.6 (L) 8.9 - 10.3 mg/dL   GFR calc non Af Amer >60 >60 mL/min   GFR calc Af Amer >60 >60 mL/min   Anion gap 8 5 - 15    Comment: Performed at [facility] [place] [address], [place], [place] [address]    Imaging / Studies: CT ABDOMEN PELVIS WO CONTRAST  Result Date: 07/20/2020 CLINICAL DATA:  Possible retroperitoneal hematoma EXAM: CT ABDOMEN AND PELVIS WITHOUT CONTRAST TECHNIQUE: Multidetector CT imaging of the abdomen and pelvis was performed following the standard protocol without IV contrast. COMPARISON:  None. FINDINGS: Lower chest: Right chest tube is noted in place. Minimal anterior pneumothorax is noted. Mild dependent atelectatic changes are noted in the right lower lobe. Some linear areas of lucency are noted in the right middle lobe near the chest tube insertion site which may  be related to the recent placement. Consolidation in the left lower lobe with associated effusion is noted similar to that seen on prior plain film examination. Hepatobiliary: No focal liver abnormality is seen. No gallstones, gallbladder wall thickening, or biliary dilatation. Pancreas: Unremarkable. No pancreatic ductal dilatation or surrounding inflammatory changes. Spleen: Normal in size without focal abnormality. Adrenals/Urinary Tract: Adrenal glands are within normal limits. Kidneys demonstrate no renal calculi or obstructive changes. Calcific changes in the left kidney are noted likely related to a partially calcified cyst. The lack of contrast limits evaluation. Foley catheter is noted decompressing the bladder. Stomach/Bowel: Postsurgical changes are noted consistent with the right hemicolectomy. In the surgical bed on the right there are scattered areas of increased attenuation likely representing postoperative hemorrhage. Some associated fluid is noted as well as multiple small bowel loops in the surgical bed. No obstructive changes are seen. The stomach is decompressed by a gastric catheter. Vascular/Lymphatic: Heavy atherosclerotic calcifications are noted. No significant lymphadenopathy is noted. Reproductive: Prostate is unremarkable. Other: Hyperdense fluid is noted within the pelvis also likely representing some postoperative hemorrhage Musculoskeletal: Mild degenerative changes of lumbar spine are noted. Subcutaneous fluid attenuation is noted along the left flank and extending towards the left buttock. This would correspond with the bruising seen on physical exam. This is likely related to the recent surgery. Again no focal hematoma is noted. IMPRESSION: Postoperative change consistent with the right hemicolectomy. There are areas of increased attenuation in the surgical bed as well as hyperdense fluid within the pelvis consistent with mild postoperative hemorrhage. No large hematoma is seen.  Some associated postoperative fluid/seroma is noted in the operative bed as well. No focal definitive collection is seen. Subcutaneous fluid attenuation in the left abdominal wall and extending into the left buttock which corresponds to the bruising seen on physical exam. These changes are likely related to the recent laparoscopic surgery. No sizable hematoma is noted. Extensive left lower lobe consolidation with associated effusion. Right chest tube placement is noted with minimal residual pneumothorax. Some areas of lucency are noted within the right middle lobe which may have been related to the placement of the chest tube. Electronically Signed   By: Inez Catalina M.D.   On: 07/20/2020 05:52   DG Abd 1 View  Result Date: 07/19/2020 CLINICAL DATA:  Check gastric catheter placement EXAM: ABDOMEN - 1 VIEW COMPARISON:  None. FINDINGS: Scattered large and small bowel gas is noted. Gastric catheter is noted within the stomach. Degenerative changes of lumbar spine are noted. Aortic calcifications are seen. IMPRESSION: Gastric catheter within the stomach. Electronically Signed  By: Inez Catalina M.D.   On: 07/19/2020 12:29   DG Chest Port 1 View  Result Date: 07/20/2020 CLINICAL DATA:  Follow-up right chest tube EXAM: PORTABLE CHEST 1 VIEW COMPARISON:  07/20/2019 FINDINGS: Cardiac shadow is stable. Endotracheal tube and gastric catheter are noted in satisfactory position. Pigtail catheter is noted on the right. Patchy infiltrate and effusion is noted in the left base slightly increased when compared with the prior exam. Right subclavian central line is seen in satisfactory position. No residual pneumothorax is seen. IMPRESSION: Tubes and lines as described above. Patchy left basilar infiltrate with associated effusion. Electronically Signed   By: Inez Catalina M.D.   On: 07/20/2020 05:53   DG CHEST PORT 1 VIEW  Result Date: 07/19/2020 CLINICAL DATA:  Follow-up intubation EXAM: PORTABLE CHEST 1 VIEW COMPARISON:   07/19/2020 FINDINGS: Endotracheal tube and gastric catheter are noted in satisfactory position. Right subclavian central venous line is noted at the cavoatrial junction. Pigtail catheter on the right is noted. No pneumothorax is seen. Diffuse airspace opacity is noted again on the left stable from the prior study. IMPRESSION: Stable appearance of the chest. Interval tube and line placement as described in satisfactory position. Electronically Signed   By: Inez Catalina M.D.   On: 07/19/2020 12:29    Medications / Allergies: per chart  Antibiotics: Anti-infectives (From admission, onward)   Start     Dose/Rate Route Frequency Ordered Stop   07/20/20 1600  ceFEPIme (MAXIPIME) 2 g in sodium chloride 0.9 % 100 mL IVPB     Discontinue     2 g 200 mL/hr over 30 Minutes Intravenous Every 12 hours 07/20/20 0919     07/20/20 0000  vancomycin (VANCOREADY) IVPB 750 mg/150 mL     Discontinue     750 mg 150 mL/hr over 60 Minutes Intravenous Every 12 hours 07/19/20 1021     07/19/20 1200  ceFEPIme (MAXIPIME) 2 g in sodium chloride 0.9 % 100 mL IVPB  Status:  Discontinued        2 g 200 mL/hr over 30 Minutes Intravenous Every 8 hours 07/19/20 1021 07/20/20 0919   07/19/20 1100  vancomycin (VANCOCIN) IVPB 1000 mg/200 mL premix        1,000 mg 200 mL/hr over 60 Minutes Intravenous  Once 07/19/20 1021 07/19/20 1416   07/18/20 0100  cefoTEtan (CEFOTAN) 2 g in sodium chloride 0.9 % 100 mL IVPB        2 g 200 mL/hr over 30 Minutes Intravenous Every 12 hours 07/17/20 1721 07/18/20 1029   07/17/20 1400  neomycin (MYCIFRADIN) tablet 1,000 mg  Status:  Discontinued       "And" Linked Group Details   1,000 mg Oral 3 times per day 07/17/20 1207 07/17/20 1723   07/17/20 1400  metroNIDAZOLE (FLAGYL) tablet 1,000 mg  Status:  Discontinued       "And" Linked Group Details   1,000 mg Oral 3 times per day 07/17/20 1207 07/17/20 1723   07/17/20 1215  cefoTEtan (CEFOTAN) 2 g in sodium chloride 0.9 % 100 mL IVPB        2  g 200 mL/hr over 30 Minutes Intravenous On call to O.R. 07/17/20 1207 07/17/20 1312        Note: Portions of this report may have been transcribed using voice recognition software. Every effort was made to ensure accuracy; however, inadvertent computerized transcription errors may be present.   Any transcriptional errors that result from this process are unintentional.  Adin Hector, MD, FACS, MASCRS Gastrointestinal and Minimally Invasive Surgery  [facility] [address], [place], [place] [address] [phone] Fax [phone] Main/Paging  CONTACT INFORMATION: Weekday (9AM-5PM) concerns: Call [facility] main office at [phone] Weeknight (5PM-9AM) or Weekend/Holiday concerns: Check [email or web address] for General Surgery [facility] coverage (Please, do not use SecureChat as it is not reliable communication to operating surgeons for immediate patient care)      07/21/2020  6:59 AM

## 2020-07-21 NOTE — Procedures (Signed)
Extubation Procedure Note  Patient Details:   Name: Timothy Shaffer DOB: 1951/03/27 MRN: [id number]   Airway Documentation:  Airway 8 mm (Active)  Secured at (cm) 23 cm 07/21/20 0800  Measured From Lips 07/21/20 0800  [place] 07/21/20 0800  Secured By [employer] 07/21/20 0800  Tube Holder Repositioned Yes 07/21/20 0800  Cuff Pressure (cm H2O) 30 cm H2O 07/21/20 0800  Site Condition Dry 07/21/20 0800   Vent end date: 07/21/20 Vent end time: 0942   Evaluation  O2 sats: stable throughout Complications: No apparent complications Patient did tolerate procedure well. Bilateral Breath Sounds: Diminished   Yes   Patient extubated to 2 L [place]; No stridor noted and O2 currently 96-97%.   Lamonte Sakai 07/21/2020, 9:47 AM

## 2020-07-21 NOTE — Progress Notes (Signed)
[place] Progress Note Patient Name: Timothy Shaffer DOB: 1951-02-01 MRN: [id number]   Date of Service  07/21/2020  HPI/Events of Note  RN requests AM lab orders.  eICU Interventions  Ordered BMP + CBC.     Intervention Category Minor Interventions: Routine modifications to care plan (e.g. PRN medications for pain, fever)  Timothy Shaffer Timothy Shaffer 07/21/2020, 4:22 AM

## 2020-07-21 NOTE — Progress Notes (Addendum)
NAME:  Timothy Shaffer, MRN:  [id number], DOB:  09/16/1951, LOS: 4 ADMISSION DATE:  07/17/2020, CONSULTATION DATE:  8/12 REFERRING MD:  Johney Maine, CHIEF COMPLAINT:  Tension PTX    Brief History   69 year old male admitted 8/11 for robotic colectomy. On 8/12 developed acute hypoxia in setting of spontaneous tension right PTX.   Past Medical History  Colon polyp, tobacco abuse, copd, etoh abuse prior perf duodenal ulcer   Significant Hospital Events   8/11 admitted -underwent robotic colectomy  8/12 spont right PTX. Chest tube placed 8/13 inc wob. New white out of left chest. abx started for possible pna > fob   Mucus plugs LLL   Consults:  PCCM 8/12  Procedures:  Right small bore chest tube 8/12>> R SCV CVL  8/13 >>>  Significant Diagnostic Tests:  FOB  8/13 mucus plugs RLL   Micro Data:  Resp culture 8/12  >>> MRSA   8/12  neg BAL   8/13 > abundant  Wbcs/ mod gpcs >>> nl flora    Antimicrobials:  Cefepime  8/13>>> Vanc  8/13>>> 8/14    Scheduled Meds: . sodium chloride   Intravenous Once  . sodium chloride   Intravenous Once  . acetaminophen  1,000 mg Per Tube Q6H  . alvimopan  12 mg Oral BID  . chlorhexidine gluconate (MEDLINE KIT)  15 mL Mouth Rinse BID  . Chlorhexidine Gluconate Cloth  6 each Topical Daily  . enoxaparin (LOVENOX) injection  40 mg Subcutaneous Q24H  . feeding supplement (VITAL HIGH PROTEIN)  1,000 mL Per Tube Q24H  . folic acid  1 mg Oral Daily  . insulin aspart  0-9 Units Subcutaneous Q4H  . ipratropium-albuterol  3 mL Nebulization Q4H  . lip balm  1 application Topical BID  . mouth rinse  15 mL Mouth Rinse 10 times per day  . pantoprazole (PROTONIX) IV  40 mg Intravenous Q12H  . polyethylene glycol  17 g Oral Daily  . sodium chloride flush  10 mL Intracatheter Q8H  . sodium chloride flush  3 mL Intravenous Q12H  . sodium chloride HYPERTONIC  4 mL Nebulization Q12H  . thiamine  100 mg Intravenous Daily   Continuous Infusions: . sodium  chloride    . ceFEPime (MAXIPIME) IV Stopped (07/21/20 0404)  . lactated ringers    . norepinephrine (LEVOPHED) Adult infusion    . phenylephrine (NEO-SYNEPHRINE) Adult infusion 40 mcg/min (07/21/20 0602)  . propofol (DIPRIVAN) infusion 35 mcg/kg/min (07/21/20 0602)  . vancomycin Stopped (07/21/20 0108)   PRN Meds:.sodium chloride, fentaNYL (SUBLIMAZE) injection, fentaNYL (SUBLIMAZE) injection, lactated ringers, magic mouthwash, [DISCONTINUED] ondansetron **OR** ondansetron (ZOFRAN) IV, sodium chloride flush  Interim history/subjective:  On wake up this am f/c and weaning nicely on physiologic PSV  Objective   Blood pressure 128/77, pulse 83, temperature 98.6 F (37 C), temperature source Oral, resp. rate 20, height 5' 11" (1.803 m), weight 64 kg, SpO2 100 %. CVP:  [11 mmHg] 11 mmHg  Vent Mode: PRVC FiO2 (%):  [50 %-100 %] 50 % Set Rate:  [20 bmp-24 bmp] 20 bmp Vt Set:  [600 mL] 600 mL PEEP:  [8 cmH20-10 cmH20] 8 cmH20 Plateau Pressure:  [12 cmH20-22 cmH20] 21 cmH20   Intake/Output Summary (Last 24 hours) at 07/21/2020 0615 Last data filed at 07/21/2020 0602 Gross per 24 hour  Intake 4388.06 ml  Output 2295 ml  Net 2093.06 ml   Filed Weights   07/17/20 1211 07/19/20 0500 07/21/20 0336  Weight: 58.1 kg  62 kg 64 kg   CVP:  [11 mmHg] 11 mmHg    Examination: Tmax  100.2 (same) Pt alert,  nad 45 degrees hob  No jvd Oropharynx et Neck supple Lungs with a few scattered exp > insp rhonchi bilaterally/ minimal secretions RRR no s3 or or sign murmur/ still on very low dose neo  Abd relatively soft/ post op  changes Extr warm with no edema or clubbing- PAS in place Neuro  F/c all 4        Resolved Hospital Problem list    Assessment & Plan:  Spontaneous right tension pneumothorax.  -now s/p right chest tube->PTX resolved. Suspect spont ruptured bleb Plan  >>> changed to water seal  Am 8/14  And probably ok to pull p et out first    Acute on chronic hypoxic respiratory  failure in setting of left sided atelectasis/mucous  Plugging +/- PNA.  H/o tobacco abuse  COPD   Plan Bronchodilators, decreased RR to eliminate air trapping 8/14 and ready for trial of extubation am 8/15  D/c vanc 8/15 as MRSA sceen/ cultures neg to date     Circulatory shock - better p trx/ almost off neo am 8/15   >>> follow cvp    S/p robotic hemicolectomy Plan Per surgical team   Mild AKI-->improved w/ IVFs Lab Results  Component Value Date   CREATININE 0.87 07/21/2020   CREATININE 1.17 07/20/2020   CREATININE 1.00 07/19/2020    Plan  Cont IVFs/ monitor cvp's Serial chem Avoid hypotension   H/o ETOH Plan Cont CIWA. If intubated try prop   Hyperglycemia  Plan ssi   Mild anemia  Lab Results  Component Value Date   HGB 9.9 (L) 07/21/2020   HGB 9.9 (L) 07/20/2020   HGB 7.0 (L) 07/20/2020    Plan  Transfuse if hgb < 7 OR gets sig hypotension   Leukocytosis  Plan Trending cbc abx per flow sheet above   Best practice:  Diet: npo Pain/Anxiety/Delirium protocol (if indicated): propofol/ prn fent bolus > wua in progress VAP protocol (if indicated): na DVT prophylaxis: scd GI prophylaxis: ppi Glucose control: ssi Mobility:bed rest  Code Status: full code  Family Communication: per surg  Disposition: ICU  Labs   CBC: Recent Labs  Lab 07/18/20 0502 07/18/20 0502 07/18/20 1206 07/19/20 0258 07/20/20 0413 07/20/20 1700 07/21/20 0439  WBC 17.7*  --  18.9* 21.4* 19.0*  --  16.1*  HGB 10.1*   < > 9.3* 8.0* 7.0* 9.9* 9.9*  HCT 30.4*   < > 27.8* 24.1* 20.6* 28.9* 29.0*  MCV 109.7*  --  109.9* 109.5* 109.6*  --  100.3*  PLT 202  --  227 152 180  --  170   < > = values in this interval not displayed.    Basic Metabolic Panel: Recent Labs  Lab 07/17/20 1733 07/17/20 1733 07/18/20 0502 07/18/20 1339 07/19/20 0258 07/20/20 0413 07/20/20 0606 07/21/20 0439  NA 136   < > 135 134* 134* 136  --  140  K 3.5   < > 4.6 4.1 4.2 4.2  --  3.4*  CL  104   < > 102 104 104 106  --  112*  CO2 23   < > _0 --  20*  GLUCOSE 219*   < > 222* 183* 133* 117*  --  113*  BUN 6*   < > _1 27*  --  22  CREATININE 0.97   < >  1.56* 1.42* 1.00 1.17  --  0.87  CALCIUM 8.4*   < > 8.6* 8.4* 8.5* 8.5*  --  8.6*  MG 1.6*  --  1.5*  --   --   --  2.0  --   PHOS 3.1  --   --   --   --   --   --   --    < > = values in this interval not displayed.   GFR: Estimated Creatinine Clearance: 72.5 mL/min (by C-G formula based on SCr of 0.87 mg/dL). Recent Labs  Lab 07/18/20 1206 07/19/20 0258 07/20/20 0413 07/21/20 0439  WBC 18.9* 21.4* 19.0* 16.1*    Liver Function Tests: Recent Labs  Lab 07/17/20 1733 07/18/20 1339 07/19/20 0258 07/20/20 0413  AST 32 _0 ALT _1 ALKPHOS 54 40 38 41  BILITOT 0.4 0.5 0.4 0.8  PROT 5.3* 5.1* 4.6* 4.7*  ALBUMIN 3.1* 2.8* 2.5* 2.4*   No results for input(s): LIPASE, AMYLASE in the last 168 hours. No results for input(s): AMMONIA in the last 168 hours.  ABG    Component Value Date/Time   PHART 7.296 (L) 07/19/2020 1156   PCO2ART 47.6 07/19/2020 1156   PO2ART 85.8 07/19/2020 1156   HCO3 22.5 07/19/2020 1156   ACIDBASEDEF 3.5 (H) 07/19/2020 1156   O2SAT 94.6 07/19/2020 1156     Coagulation Profile: Recent Labs  Lab 07/20/20 0606  INR 1.1    Cardiac Enzymes: No results for input(s): CKTOTAL, CKMB, CKMBINDEX, TROPONINI in the last 168 hours.  HbA1C: Hgb A1c MFr Bld  Date/Time Value Ref Range Status  07/08/2020 12:01 PM 5.0 4.8 - 5.6 % Final    Comment:    (NOTE) Pre diabetes:          5.7%-6.4%  Diabetes:              >6.4%  Glycemic control for   <7.0% adults with diabetes     CBG: Recent Labs  Lab 07/20/20 1238 07/20/20 1545 07/20/20 1930 07/20/20 2319 07/21/20 0319  GLUCAP 99 100* 104* 90 96       The patient is critically ill with multiple organ systems failure and requires high complexity decision making for assessment and support, frequent  evaluation and titration of therapies, application of advanced monitoring technologies and extensive interpretation of multiple databases. Critical Care Time devoted to patient care services described in this note is 40 minutes.    Christinia Gully, MD Pulmonary and [place] [phone]   After 7:00 pm call Elink  [phone]

## 2020-07-22 ENCOUNTER — Inpatient Hospital Stay (HOSPITAL_COMMUNITY): Payer: Medicare HMO

## 2020-07-22 DIAGNOSIS — D120 Benign neoplasm of cecum: Secondary | ICD-10-CM | POA: Diagnosis not present

## 2020-07-22 LAB — CBC
HCT: 29.6 % — ABNORMAL LOW (ref 39.0–52.0)
Hemoglobin: 9.9 g/dL — ABNORMAL LOW (ref 13.0–17.0)
MCH: 33.9 pg (ref 26.0–34.0)
MCHC: 33.4 g/dL (ref 30.0–36.0)
MCV: 101.4 fL — ABNORMAL HIGH (ref 80.0–100.0)
Platelets: 205 10*3/uL (ref 150–400)
RBC: 2.92 MIL/uL — ABNORMAL LOW (ref 4.22–5.81)
RDW: 16.6 % — ABNORMAL HIGH (ref 11.5–15.5)
WBC: 15 10*3/uL — ABNORMAL HIGH (ref 4.0–10.5)
nRBC: 0 % (ref 0.0–0.2)

## 2020-07-22 LAB — GLUCOSE, CAPILLARY
Glucose-Capillary: 100 mg/dL — ABNORMAL HIGH (ref 70–99)
Glucose-Capillary: 107 mg/dL — ABNORMAL HIGH (ref 70–99)
Glucose-Capillary: 110 mg/dL — ABNORMAL HIGH (ref 70–99)
Glucose-Capillary: 138 mg/dL — ABNORMAL HIGH (ref 70–99)
Glucose-Capillary: 93 mg/dL (ref 70–99)
Glucose-Capillary: 97 mg/dL (ref 70–99)

## 2020-07-22 LAB — POTASSIUM: Potassium: 3.4 mmol/L — ABNORMAL LOW (ref 3.5–5.1)

## 2020-07-22 LAB — HEMOGLOBIN AND HEMATOCRIT, BLOOD
HCT: 31 % — ABNORMAL LOW (ref 39.0–52.0)
Hemoglobin: 10.6 g/dL — ABNORMAL LOW (ref 13.0–17.0)

## 2020-07-22 LAB — CREATININE, SERUM
Creatinine, Ser: 0.77 mg/dL (ref 0.61–1.24)
GFR calc Af Amer: >60 mL/min (ref >60)
GFR calc non Af Amer: >60 mL/min (ref >60)

## 2020-07-22 MED ORDER — PANTOPRAZOLE SODIUM 40 MG PO TBEC
40.0000 mg | DELAYED_RELEASE_TABLET | Freq: Every day | ORAL | Status: DC
  Administered 2020-07-22 – 2020-07-24 (×3): 40 mg via ORAL
  Filled 2020-07-22 (×3): qty 1

## 2020-07-22 MED ORDER — ALBUTEROL SULFATE (2.5 MG/3ML) 0.083% IN NEBU: 2.5000 mg | INHALATION_SOLUTION | RESPIRATORY_TRACT | Status: DC | PRN

## 2020-07-22 MED ORDER — POTASSIUM CHLORIDE CRYS ER 20 MEQ PO TBCR
40.0000 meq | EXTENDED_RELEASE_TABLET | Freq: Once | ORAL | Status: AC
  Administered 2020-07-22: 40 meq via ORAL
  Filled 2020-07-22: qty 2

## 2020-07-22 MED FILL — Sodium Chloride IV Soln 0.9%: INTRAVENOUS | Qty: 250 | Status: AC

## 2020-07-22 MED FILL — Phenylephrine HCl IV Soln 10 MG/ML: INTRAVENOUS | Qty: 1 | Status: AC

## 2020-07-22 NOTE — Progress Notes (Signed)
Pharmacy Brief Note - Alvimopan (Entereg)  The standing order set for alvimopan (Entereg) now includes an automatic order to discontinue the drug after the patient has had a bowel movement.  The change was approved by the [place] and the Medical Executive Committee.    This patient has had a bowel movement documented by nursing.  Therefore, alvimopan has been discontinued.  If there are questions, please contact the pharmacy at [phone].  Thank you Gretta Arab PharmD, BCPS Clinical Pharmacist [facility] main pharmacy [phone] 07/22/2020 12:56 PM

## 2020-07-22 NOTE — Progress Notes (Signed)
NAME:  Timothy Shaffer, MRN:  [id number], DOB:  Nov 28, 1951, LOS: 5 ADMISSION DATE:  07/17/2020, CONSULTATION DATE:  8/12 REFERRING MD:  Johney Maine, CHIEF COMPLAINT:  Tension PTX    Brief History   69 year old male admitted 8/11 for robotic colectomy. On 8/12 developed acute hypoxia in setting of spontaneous tension right PTX.   Past Medical History  Colon polyp, tobacco abuse, copd, etoh abuse prior perf duodenal ulcer   Significant Hospital Events   8/11 admitted -underwent robotic colectomy  8/12 spont right PTX. Chest tube placed 8/13 inc wob. New white out of left chest. Intubated, abx started for possible pna > fob showed  mucus plugs LLL  8/15 Extubated 8/16 Chest tube removed  Consults:  PCCM 8/12  Procedures:  ETT 8/13-8/15 Right small bore chest tube 8/12>> 8/16 R SCV CVL  8/13 >>>  Significant Diagnostic Tests:  FOB  8/13 mucus plugs RLL   Micro Data:  Resp culture 8/12  >>> MRSA   8/12  neg BAL   8/13 > abundant  Wbcs/ mod gpcs >>> nl flora   Antimicrobials:  Cefepime  8/13>>> Vanc  8/13>>> 8/14   Scheduled Meds: . sodium chloride   Intravenous Once  . sodium chloride   Intravenous Once  . acetaminophen  1,000 mg Oral Q6H  . alvimopan  12 mg Oral BID  . Chlorhexidine Gluconate Cloth  6 each Topical Daily  . enoxaparin (LOVENOX) injection  40 mg Subcutaneous Q24H  . folic acid  1 mg Oral Daily  . insulin aspart  0-9 Units Subcutaneous Q4H  . ipratropium-albuterol  3 mL Nebulization TID  . lip balm  1 application Topical BID  . pantoprazole  40 mg Oral Q1200  . sodium chloride flush  10 mL Intracatheter Q8H  . thiamine  100 mg Intravenous Daily   Continuous Infusions: . ceFEPime (MAXIPIME) IV Stopped (07/22/20 0515)  . lactated ringers    . norepinephrine (LEVOPHED) Adult infusion     PRN Meds:.lactated ringers, magic mouthwash, [DISCONTINUED] ondansetron **OR** ondansetron (ZOFRAN) IV  Interim history/subjective:   Extubated yesterday.  Doing well  from respiratory standpoint.  Up in chair at bedside No complaints Chest tube removed today morning  Objective   Blood pressure 118/84, pulse 71, temperature 98.9 F (37.2 C), temperature source Oral, resp. rate 20, height _0  (1.803 m), weight 64 kg, SpO2 94 %. CVP:  [9 mmHg] 9 mmHg      Intake/Output Summary (Last 24 hours) at 07/22/2020 0952 Last data filed at 07/22/2020 0800 Gross per 24 hour  Intake 900 ml  Output 1120 ml  Net -220 ml   Filed Weights   07/17/20 1211 07/19/20 0500 07/21/20 0336  Weight: 58.1 kg 62 kg 64 kg   CVP:  [9 mmHg] 9 mmHg    Examination: Gen:      Chronically ill-appearing HEENT:  EOMI, sclera anicteric Neck:     No masses; no thyromegaly Lungs:    Diminished air entry, scattered rhonchi CV:         Regular rate and rhythm; no murmurs Abd:      + bowel sounds; soft, non-tender; no palpable masses, no distension Ext:    No edema; adequate peripheral perfusion Skin:      Warm and dry; no rash Neuro: alert and oriented x 3  Labs/imaging reviewed significant for WBC 15, hemoglobin 9.9, platelets 205 Chest x-ray 8/16-mild increase in left lower lobe airspace disease, effusion.  No pneumothorax  Resolved Hospital Problem  list    Assessment & Plan:  Spontaneous right tension pneumothorax.  Acute respiratory failure secondary to left lower lobe pneumonia, mucous plugging H/o tobacco abuse  COPD Plan Extubated.  Wean down oxygen.  Follow intermittent chest x-ray Continue antibiotics Chest tube is out.  Repeat chest x-ray in 1 to 2 hours.  S/p robotic hemicolectomy Plan Per surgical team   Mild AKI-->improved w/ IVFs Plan  Follow labs  H/o ETOH Plan Cont CIWA.  Hyperglycemia  Plan ssi   Mild anemia  Plan  Transfuse if hgb < 7 OR gets sig hypotension   Leukocytosis  Plan Trending cbc abx per flow sheet above  DC central line after ensuring adequate peripheral IV access.  Noted plans from primary team for transfer out of  ICU PCCM will be available as needed.  Please call with questions   Best practice:  Diet: P.o. diet Pain/Anxiety/Delirium protocol (if indicated): propofol/ prn fent bolus > wua in progress VAP protocol (if indicated): na DVT prophylaxis: scd GI prophylaxis: ppi Glucose control: ssi Mobility:bed rest  Code Status: full code  Family Communication: per surg  Disposition: ICU  Signature:   Marshell Garfinkel MD [place] Please see [email or web address] for pager details.  07/22/2020, 10:00 AM

## 2020-07-22 NOTE — Progress Notes (Signed)
Orders to start albuterol prn. Night shift made aware to call Respiratory to admin. MEWS continued.

## 2020-07-22 NOTE — Evaluation (Signed)
Physical Therapy Evaluation Patient Details Name: Timothy Shaffer MRN: [id number] DOB: 12/13/1950 Today's Date: 07/22/2020   History of Present Illness  69 year old male admitted 8/11 for robotic colectomy. On 8/12 developed acute hypoxia in setting of spontaneous tension right PTX-chest tube. Inubated and now extubated (8/15). Chest tube pulled 8/16  Clinical Impression  The patient  Received in recliner. Patient  Just had CT removed.The patient did ambulate 15' forward and then backward. The patient is quite weak. SPO2 on 2 L 88% -92%.  Pt admitted with above diagnosis.  Pt currently with functional limitations due to the deficits listed below (see PT Problem List). Pt will benefit from skilled PT to increase their independence and safety with mobility to allow discharge to the venue listed below.    patient should progress to DC home.      Follow Up Recommendations Home health PT;Supervision - Intermittent    Equipment Recommendations  Rolling walker with 5" wheels    Recommendations for Other Services       Precautions / Restrictions Precautions Precautions: Fall Precaution Comments: monitor VS      Mobility  Bed Mobility               General bed mobility comments: Pt up in recliner upon arrival  Transfers Overall transfer level: Needs assistance Equipment used: Rolling walker (2 wheeled) Transfers: Sit to/from Stand Sit to Stand: Min assist;+2 safety/equipment         General transfer comment: VCs for safe hand placement, decreased control of descent, multimodal cues for hand placement  Ambulation/Gait Ambulation/Gait assistance: Min assist;+2 safety/equipment Gait Distance (Feet): 30 Feet Assistive device: Rolling walker (2 wheeled) Gait Pattern/deviations: Step-to pattern;Step-through pattern        Stairs            Wheelchair Mobility    Modified Rankin (Stroke Patients Only)       Balance Overall balance assessment: Needs  assistance Sitting-balance support: No upper extremity supported;Feet supported Sitting balance-Leahy Scale: Good     Standing balance support: During functional activity;No upper extremity supported Standing balance-Leahy Scale: Fair                               Pertinent Vitals/Pain Pain Assessment: No/denies pain    Home Living Family/patient expects to be discharged to:: Private residence Living Arrangements: Spouse/significant other Available Help at Discharge: Family Type of Home: House Home Access: Stairs to enter Entrance Stairs-Rails: None   Home Layout: Two level;Full bath on main level Home Equipment: Shower seat - built in;Hand held Tourist information centre manager - 4 wheels;Cane - single point;Wheelchair - manual      Prior Function Level of Independence: Independent               Hand Dominance   Dominant Hand: Right    Extremity/Trunk Assessment   Upper Extremity Assessment Upper Extremity Assessment: Defer to OT evaluation    Lower Extremity Assessment Lower Extremity Assessment: Generalized weakness    Cervical / Trunk Assessment Cervical / Trunk Assessment: Normal  Communication   Communication: No difficulties  Cognition Arousal/Alertness: Awake/alert Behavior During Therapy: WFL for tasks assessed/performed Overall Cognitive Status: Within Functional Limits for tasks assessed                                        General Comments  Exercises     Assessment/Plan    PT Assessment Patient needs continued PT services  PT Problem List Decreased strength;Decreased mobility;Decreased safety awareness;Decreased activity tolerance;Decreased balance;Decreased knowledge of use of DME;Decreased knowledge of precautions       PT Treatment Interventions DME instruction;Therapeutic activities;Gait training;Therapeutic exercise;Patient/family education;Functional mobility training    PT Goals (Current goals can be found  in the Care Plan section)  Acute Rehab PT Goals Patient Stated Goal: to go home and take care of my animals (chickens, goats, pot belly pigs) PT Goal Formulation: With patient Time For Goal Achievement: 08/05/20 Potential to Achieve Goals: Good    Frequency Min 3X/week   Barriers to discharge        Co-evaluation PT/OT/SLP Co-Evaluation/Treatment: Yes Reason for Co-Treatment: For patient/therapist safety PT goals addressed during session: Mobility/safety with mobility OT goals addressed during session: ADL's and self-care       AM-PAC PT "6 Clicks" Mobility  Outcome Measure Help needed turning from your back to your side while in a flat bed without using bedrails?: A Little Help needed moving from lying on your back to sitting on the side of a flat bed without using bedrails?: A Little Help needed moving to and from a bed to a chair (including a wheelchair)?: A Little Help needed standing up from a chair using your arms (e.g., wheelchair or bedside chair)?: A Little Help needed to walk in hospital room?: A Lot Help needed climbing 3-5 steps with a railing? : A Lot 6 Click Score: 16    End of Session   Activity Tolerance: Patient limited by fatigue Patient left: in chair;with call bell/phone within reach Nurse Communication: Mobility status PT Visit Diagnosis: Unsteadiness on feet (R26.81);Difficulty in walking, not elsewhere classified (R26.2)    Time: 7989-2119 PT Time Calculation (min) (ACUTE ONLY): 30 min   Charges:   PT Evaluation $PT Eval Moderate Complexity: [place] Pager [phone] Office [phone]   Claretha Cooper 07/22/2020, 1:26 PM

## 2020-07-22 NOTE — Progress Notes (Signed)
Pharmacy Antibiotic Note  Timothy Shaffer is a 69 y.o. male admitted on 07/17/2020 for robotic colectomy. On 8/12 developed acute hypoxia in setting of spontaneous tension right PTX. [place] Kitchen  Pharmacy has been consulted for cefepime dosing for pna.  Plan: Cefepime 2gm IV q8h Follow renal function and clinical course  Height: _0  (180.3 cm) Weight: 64 kg (141 lb 1.5 oz) IBW/kg (Calculated) : 75.3  Temp (24hrs), Avg:98 F (36.7 C), Min:97.1 F (36.2 C), Max:98.9 F (37.2 C)  Recent Labs  Lab 07/18/20 0502 07/18/20 1206 07/18/20 1339 07/19/20 0258 07/20/20 0413 07/21/20 0439 07/22/20 0421  WBC  --  18.9*  --  21.4* 19.0* 16.1* 15.0*  CREATININE   < >  --  1.42* 1.00 1.17 0.87 0.77   < > = values in this interval not displayed.    Estimated Creatinine Clearance: 78.9 mL/min (by C-G formula based on SCr of 0.77 mg/dL).    Allergies  Allergen Reactions  . Nsaids     History of perforated ulcer    Antimicrobials this admission: 8/12 Cefotetan> 8/12 8/13 vanc >> 8/15 8/13 cefepime >>  Dose adjustments this admission:     Microbiology results: 8/12 Sputum: normal resp flora  8/12 MRSA PCR: neg 8/13 BAL: >100k colonies, normal resp flora   Thank you for allowing pharmacy to be a part of this patient's care.  Gretta Arab PharmD, BCPS Clinical Pharmacist [facility] main pharmacy [phone] 07/22/2020 1:05 PM

## 2020-07-22 NOTE — Evaluation (Signed)
Occupational Therapy Evaluation Patient Details Name: Timothy Shaffer MRN: [id number] DOB: 09-Mar-1951 Today's Date: 07/22/2020    History of Present Illness 69 year old male admitted 8/11 for robotic colectomy. On 8/12 developed acute hypoxia in setting of spontaneous tension right PTX-chest tube. Inubated and now extubated (8/15). Chest tube pulled 8/16   Clinical Impression   This 69 yo male admitted with above presents to acute OT with PLOF of being totally independent with basic ADLs. 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 He currently is +2 min A for mobility due to safety (weakness) and lines/tubes and setup/S-min A for basic ADLs. He will benefit from acute OT with follow up [place] to get back to his PLOF.    Follow Up Recommendations  Home health OT;Supervision/Assistance - 24 hour    Equipment Recommendations  None recommended by OT       Precautions / Restrictions Precautions Precautions: Fall Restrictions Weight Bearing Restrictions: No      Mobility Bed Mobility               General bed mobility comments: Pt up in recliner upon arrival  Transfers Overall transfer level: Needs assistance Equipment used: Rolling walker (2 wheeled) Transfers: Sit to/from Stand Sit to Stand: Min assist;+2 safety/equipment         General transfer comment: VCs for safe hand placement    Balance Overall balance assessment: Needs assistance Sitting-balance support: No upper extremity supported;Feet supported Sitting balance-Leahy Scale: Good     Standing balance support: During functional activity;No upper extremity supported Standing balance-Leahy Scale: Fair Standing balance comment: standing for back peri care and to pull up pants                           ADL either performed or assessed with clinical judgement   ADL Overall ADL's : Needs assistance/impaired Eating/Feeding: Independent;Sitting   Grooming: Set up;Sitting   Upper Body Bathing:  Set up;Sitting   Lower Body Bathing: Minimal assistance;Sit to/from stand   Upper Body Dressing : Set up;Sitting   Lower Body Dressing: Minimal assistance;Sit to/from stand   Toilet Transfer: Minimal assistance;+2 for safety/equipment;RW Toilet Transfer Details (indicate cue type and reason): recliner>around end of bed>back up to recliner Toileting- Clothing Manipulation and Hygiene: Minimal assistance;Sit to/from stand               Vision Baseline Vision/History: Wears glasses Wears Glasses: Reading only Patient Visual Report: No change from baseline              Pertinent Vitals/Pain Pain Assessment:  (only where chest tube was pulled)     Hand Dominance Right   Extremity/Trunk Assessment Upper Extremity Assessment Upper Extremity Assessment: Overall WFL for tasks assessed           Communication Communication Communication: No difficulties   Cognition Arousal/Alertness: Awake/alert Behavior During Therapy: WFL for tasks assessed/performed Overall Cognitive Status: Within Functional Limits for tasks assessed                                                Home Living Family/patient expects to be discharged to:: Private residence Living Arrangements: Spouse/significant other Available Help at Discharge: Family Type of Home: House Home Access: Stairs to enter [employer] of Steps: 1 Entrance Stairs-Rails: None Home Layout: Two level;Full bath  on main level   Alternate Level Stairs-Rails: None Bathroom Shower/Tub: Walk-in Hydrologist: Standard     Home Equipment: Shower seat - built in;Hand held Tourist information centre manager - 4 wheels;Cane - single point;Wheelchair - manual          Prior Functioning/Environment Level of Independence: Independent                 OT Problem List: Impaired balance (sitting and/or standing);Cardiopulmonary status limiting activity      OT Treatment/Interventions:  Self-care/ADL training;DME and/or AE instruction;Patient/family education;Energy conservation;Balance training    OT Goals(Current goals can be found in the care plan section) Acute Rehab OT Goals Patient Stated Goal: to go home and take care of my animals (chickens, goats, pot belly pigs) OT Goal Formulation: With patient Time For Goal Achievement: 08/05/20 Potential to Achieve Goals: Good  OT Frequency: Min 2X/week              AM-PAC OT "6 Clicks" Daily Activity     Outcome Measure Help from another person eating meals?: None Help from another person taking care of personal grooming?: A Little Help from another person toileting, which includes using toliet, bedpan, or urinal?: A Little Help from another person bathing (including washing, rinsing, drying)?: A Little Help from another person to put on and taking off regular upper body clothing?: A Little Help from another person to put on and taking off regular lower body clothing?: A Little 6 Click Score: 19   End of Session Equipment Utilized During Treatment: Gait belt;Rolling walker;Oxygen (2 liters')  Activity Tolerance: Patient tolerated treatment well Patient left: in chair;with call bell/phone within reach  OT Visit Diagnosis: Unsteadiness on feet (R26.81);Other abnormalities of gait and mobility (R26.89);Muscle weakness (generalized) (M62.81)                Time: 6546-5035 OT Time Calculation (min): 30 min Charges:  OT General Charges $OT Visit: 1 Visit OT Evaluation $OT Eval Moderate Complexity: [place], OTR/L Acute [employer] Pager [phone] Office [phone]     Almon Register 07/22/2020, 9:12 AM

## 2020-07-22 NOTE — Progress Notes (Signed)
Timothy Shaffer [id number] 1951-11-11  CARE TEAM:  PCP: Aura Dials, MD  Outpatient Care Team: Patient Care Team: Aura Dials, MD as PCP - General (Family Medicine) Michael Boston, MD as Consulting Physician (General Surgery) Wilford Corner, MD as Consulting Physician (Gastroenterology) Deliah Goody, PA-C as Physician Assistant (Family Medicine)  Inpatient Treatment Team: Treatment Team: Attending Provider: Michael Boston, MD; Technician: Ernest Mallick, NT; Consulting Physician: Pccm, Md, MD; Technician: Eden Lathe, NT; Technician: Antoine Primas; Utilization Review: Glean Salvo, RN; Physical Therapist: Fuller Mandril, PT; Registered Nurse: Laneta Simmers, RN; Registered Nurse: Elyn Aquas, RN; Consulting Physician: Edison Pace Md, MD; Technician: Kizzie Furnish, NT; Case Manager: Frann Rider, RN   Problem List:   Principal Problem:   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 Active Problems:   Spontaneous tension pneumothorax s/p chest tube decompression 07/18/2020   PUD (peptic ulcer disease)   Tobacco abuse   Moderate alcohol consumption   Mass of cecum   Hyperglycemia   Acute respiratory failure (HCC)   Pneumonia   Shock circulatory ([place])   5 Days Post-Op  07/17/2020  POST-OPERATIVE DIAGNOSIS:   CECAL COLON POLYP UNRESECTABLE BY COLONOSCOPY Transverse colon polyp s/p polypectomy with scar  PROCEDURE:  ROBOTIC PROXIMAL HEMICOLECTOMY ROBOTIC LYSIS OF ADHESIONS X 60MIN (1/2 CASE) BILATERAL TAP BLOCK  SURGEON:  Adin Hector, MD    Assessment  Respiratory failure most likely a combination of spontaneous tension pneumothorax and COPD emphysema.  Possible pneumonia  [facility] Stay = 5 days)  Plan:  Stable status post extubation.  Diuresis as tolerated.  Vance diet since ileus has resolved.  I remove the chest tube.  Discussed with critical care.  Follow-up x-ray.  If stable from respiratory  standpoint and no pneumothorax, most likely can transfer to floor bed later today if critical care agrees.    Antibiotics and pulmonary toilet for presumed left side pneumonia related to mucous plugging.  Secretions not too severe.  Defer to pulmonary on duration of antibiotic therapy  Try and keep on the dry side since creatinine now normal.  Okay to diurese from my standpoint as long as we follow his urine output and creatinine closely.   Can do Lasix after transfusion if needed as needed boluses.  We will try albumin.  CIWA alcohol withdrawal protocol.  He looks much more alert today, a guardedly hopeful sign.  Folate and thiamine.  Multivitamin.  Follow-up on pathology consistent with adenomatous polyp.  No cancer.  VTE prophylaxis- SCDs, etc    35 minutes spent in review, evaluation, examination, counseling, and coordination of care.  More than 50% of that time was spent in counseling.  07/22/2020    Subjective: (Chief complaint)  Extubated.  Moving bowels.  Tolerating liquids  Objective:  Vital signs:  Vitals:   07/22/20 0300 07/22/20 0400 07/22/20 0500 07/22/20 0600  BP: 120/63 120/69 119/63 (!) 124/58  Pulse: 86 86 79 72  Resp: (!) 27 (!) 28 (!) 27 (!) 23  Temp:  98 F (36.7 C)    TempSrc:  Oral    SpO2: 92% 94% (!) 89% 92%  Weight:      Height:        Last BM Date: 07/21/20  Intake/Output   Yesterday:  08/15 0701 - 08/16 0700 In: 958.3 [P.O.:900; I.V.:58.3] Out: 1270 [Urine:1150; Chest Tube:120] This shift:  No intake/output data recorded.  Bowel function:  Flatus: YES  BM:  YES  Drain: Right anterior pigtail chest tube  in place with serous drainage.  I removed without difficulty.   Physical Exam:  General: Patient sitting up in no acute distress.  Not confused.  Not agitated.  Tired but not sickly/ toxic Eyes: PERRL, normal EOM.  Sclera clear.  No icterus Neuro: CN II-XII intact w/o focal sensory/motor deficits. Lymph: No head/neck/groin  lymphadenopathy Psych:  No delerium/psychosis/paranoia.  Oriented x 4 HENT: Normocephalic, Mucus membranes moist.  No thrush Neck: Supple, No tracheal deviation.  No obvious thyromegaly Chest: Coarse breath sounds bilaterally.  Chest tube in right infraclavicular space.  Mild conversational dyspnea.   CV:  Pulses intact.  Regular rhythm.  No major extremity edema MS: Normal AROM mjr joints.  No obvious deformity  Abdomen: Soft.  Nondistended.  Mildly tender at incisions only.  Dressings removed.  Incisions clean dry and intact.  Mild suprapubic and genital ecchymosis stable no evidence of peritonitis.  No incarcerated hernias.  GU: Ecchymosis on penis and scrotum going to left flank stable and not worse.  But no major edema.  Foley catheter in place.  Clear colorless yellow urine Ext:  No deformity.  No mjr edema.  No cyanosis Skin: No petechiae / purpurea.  No major sores.  Warm and dry    Results:   Cultures: Recent Results (from the past 720 hour(s))  SARS CORONAVIRUS 2 (TAT 6-24 HRS) Nasopharyngeal Nasopharyngeal Swab     Status: None   Collection Time: 07/13/20 10:15 AM   Specimen: Nasopharyngeal Swab  Result Value Ref Range Status   SARS Coronavirus 2 NEGATIVE NEGATIVE Final    Comment: (NOTE) SARS-CoV-2 target nucleic acids are NOT DETECTED.  The SARS-CoV-2 RNA is generally detectable in upper and lower respiratory specimens during the acute phase of infection. Negative results do not preclude SARS-CoV-2 infection, do not rule out co-infections with other pathogens, and should not be used as the sole basis for treatment or other patient management decisions. Negative results must be combined with clinical observations, patient history, and epidemiological information. The expected result is Negative.  Fact Sheet for Patients: SugarRoll.be  Fact Sheet for Healthcare Providers: https://www.woods-mathews.com/  This test is not yet  approved or cleared by the [place] FDA and  has been authorized for detection and/or diagnosis of SARS-CoV-2 by FDA under an Emergency Use Authorization (EUA). This EUA will remain  in effect (meaning this test can be used) for the duration of the COVID-19 declaration under Se ction 564(b)(1) of the Act, 21 U.S.C. section 360bbb-3(b)(1), unless the authorization is terminated or revoked sooner.  Performed at [facility], [place] [address] [place], [place] [address]   MRSA PCR Screening     Status: None   Collection Time: 07/18/20  1:43 PM   Specimen: Nasal Mucosa; Nasopharyngeal  Result Value Ref Range Status   MRSA by PCR NEGATIVE NEGATIVE Final    Comment:        The GeneXpert MRSA Assay (FDA approved for NASAL specimens only), is one component of a comprehensive MRSA colonization surveillance program. It is not intended to diagnose MRSA infection nor to guide or monitor treatment for MRSA infections. Performed at [facility], [place] [address] [place], [place] [address]   Culture, respiratory (non-expectorated)     Status: None   Collection Time: 07/18/20  9:16 PM   Specimen: Tracheal Aspirate; Respiratory  Result Value Ref Range Status   Specimen Description   Final    TRACHEAL ASPIRATE Performed at [facility] [address]  [place], [place] [address]    Special Requests   Final    NONE Performed at [facility], [place] [address], [place], [place] [address]    Gram Stain   Final    NO WBC SEEN FEW SQUAMOUS EPITHELIAL CELLS PRESENT MODERATE GRAM POSITIVE COCCI RARE GRAM POSITIVE RODS    Culture   Final    Normal respiratory flora-no Staph aureus or Pseudomonas seen Performed at [facility], [address] [place], [place] [address]    Report Status 07/21/2020 FINAL  Final  Culture, bal-quantitative     Status: Abnormal   Collection Time: 07/19/20 12:05 PM   Specimen: Bronchoalveolar  Lavage; Respiratory  Result Value Ref Range Status   Specimen Description   Final    BRONCHIAL ALVEOLAR LAVAGE RLL Performed at [place] [address], [place], [place] [address]    Special Requests   Final    NONE Performed at [facility], [place] [address] [place], [place] [address]    Gram Stain   Final    ABUNDANT WBC PRESENT, PREDOMINANTLY PMN MODERATE GRAM POSITIVE COCCI    Culture (A)  Final    >=100,000 COLONIES/mL Consistent with normal respiratory flora. Performed at [facility], [place] [address], [place], [place] [address]    Report Status 07/20/2020 FINAL  Final    Labs: Results for orders placed or performed during the hospital encounter of 07/17/20 (from the past 48 hour(s))  Glucose, capillary     Status: None   Collection Time: 07/20/20 12:38 PM  Result Value Ref Range   Glucose-Capillary 99 70 - 99 mg/dL    Comment: Glucose reference range applies only to samples taken after fasting for at least 8 hours.   Comment 1 Notify RN    Comment 2 Document in Chart   Glucose, capillary     Status: Abnormal   Collection Time: 07/20/20  3:45 PM  Result Value Ref Range   Glucose-Capillary 100 (H) 70 - 99 mg/dL    Comment: Glucose reference range applies only to samples taken after fasting for at least 8 hours.   Comment 1 Notify RN    Comment 2 Document in Chart   Hemoglobin and hematocrit, blood     Status: Abnormal   Collection Time: 07/20/20  5:00 PM  Result Value Ref Range   Hemoglobin 9.9 (L) 13.0 - 17.0 g/dL    Comment: REPEATED TO VERIFY   HCT 28.9 (L) 39 - 52 %    Comment: Performed at [facility], [place] [address] [place], [place] [address]  Glucose, capillary     Status: Abnormal   Collection Time: 07/20/20  7:30 PM  Result Value Ref Range   Glucose-Capillary 104 (H) 70 - 99 mg/dL    Comment: Glucose reference range applies only to samples taken after fasting for at least 8 hours.    Comment 1 Notify RN    Comment 2 Document in Chart   Glucose, capillary     Status: None   Collection Time: 07/20/20 11:19 PM  Result Value Ref Range   Glucose-Capillary 90 70 - 99 mg/dL    Comment: Glucose reference range applies only to samples taken after fasting for at least 8 hours.   Comment 1 Notify RN    Comment 2 Document in Chart   Glucose, capillary     Status: None   Collection Time: 07/21/20  3:19 AM  Result Value Ref  Range   Glucose-Capillary 96 70 - 99 mg/dL    Comment: Glucose reference range applies only to samples taken after fasting for at least 8 hours.   Comment 1 Notify RN    Comment 2 Document in Chart   Triglycerides     Status: Abnormal   Collection Time: 07/21/20  4:30 AM  Result Value Ref Range   Triglycerides 201 (H) <150 mg/dL    Comment: Performed at [facility], [place] [address], [place], [place] [address]  CBC     Status: Abnormal   Collection Time: 07/21/20  4:39 AM  Result Value Ref Range   WBC 16.1 (H) 4.0 - 10.5 K/uL   RBC 2.89 (L) 4.22 - 5.81 MIL/uL   Hemoglobin 9.9 (L) 13.0 - 17.0 g/dL   HCT 29.0 (L) 39 - 52 %   MCV 100.3 (H) 80.0 - 100.0 fL    Comment: REPEATED TO VERIFY DELTA CHECK NOTED    MCH 34.3 (H) 26.0 - 34.0 pg   MCHC 34.1 30.0 - 36.0 g/dL   RDW 17.4 (H) 11.5 - 15.5 %   Platelets 170 150 - 400 K/uL   nRBC 0.1 0.0 - 0.2 %    Comment: Performed at [facility] [place], [place] [address] [place], [place] [address]  Basic metabolic panel     Status: Abnormal   Collection Time: 07/21/20  4:39 AM  Result Value Ref Range   Sodium 140 135 - 145 mmol/L   Potassium 3.4 (L) 3.5 - 5.1 mmol/L    Comment: DELTA CHECK NOTED   Chloride 112 (H) 98 - 111 mmol/L   CO2 20 (L) 22 - 32 mmol/L   Glucose, Bld 113 (H) 70 - 99 mg/dL    Comment: Glucose reference range applies only to samples taken after fasting for at least 8 hours.   BUN 22 8 - 23 mg/dL   Creatinine, Ser 0.87 0.61 - 1.24 mg/dL   Calcium 8.6 (L) 8.9 -  10.3 mg/dL   GFR calc non Af Amer >60 >60 mL/min   GFR calc Af Amer >60 >60 mL/min   Anion gap 8 5 - 15    Comment: Performed at [facility], [place] [address], [place], [place] [address]  Glucose, capillary     Status: Abnormal   Collection Time: 07/21/20  7:53 AM  Result Value Ref Range   Glucose-Capillary 102 (H) 70 - 99 mg/dL    Comment: Glucose reference range applies only to samples taken after fasting for at least 8 hours.   Comment 1 Notify RN    Comment 2 Document in Chart   Glucose, capillary     Status: None   Collection Time: 07/21/20 12:37 PM  Result Value Ref Range   Glucose-Capillary 97 70 - 99 mg/dL    Comment: Glucose reference range applies only to samples taken after fasting for at least 8 hours.   Comment 1 Notify RN    Comment 2 Document in Chart   Glucose, capillary     Status: Abnormal   Collection Time: 07/21/20  7:50 PM  Result Value Ref Range   Glucose-Capillary 122 (H) 70 - 99 mg/dL    Comment: Glucose reference range applies only to samples taken after fasting for at least 8 hours.   Comment 1 Notify RN    Comment 2 Document in Chart   Glucose, capillary     Status: None   Collection Time: 07/21/20 11:04 PM  Result  Value Ref Range   Glucose-Capillary 70 70 - 99 mg/dL    Comment: Glucose reference range applies only to samples taken after fasting for at least 8 hours.   Comment 1 Notify RN    Comment 2 Document in Chart   Glucose, capillary     Status: None   Collection Time: 07/22/20  3:59 AM  Result Value Ref Range   Glucose-Capillary 97 70 - 99 mg/dL    Comment: Glucose reference range applies only to samples taken after fasting for at least 8 hours.   Comment 1 Notify RN    Comment 2 Document in Chart   CBC     Status: Abnormal   Collection Time: 07/22/20  4:21 AM  Result Value Ref Range   WBC 15.0 (H) 4.0 - 10.5 K/uL   RBC 2.92 (L) 4.22 - 5.81 MIL/uL   Hemoglobin 9.9 (L) 13.0 - 17.0 g/dL   HCT 29.6 (L) 39 - 52 %   MCV  101.4 (H) 80.0 - 100.0 fL   MCH 33.9 26.0 - 34.0 pg   MCHC 33.4 30.0 - 36.0 g/dL   RDW 16.6 (H) 11.5 - 15.5 %   Platelets 205 150 - 400 K/uL   nRBC 0.0 0.0 - 0.2 %    Comment: Performed at [facility], [place] [address] [place], [place] [address]  Potassium     Status: Abnormal   Collection Time: 07/22/20  4:21 AM  Result Value Ref Range   Potassium 3.4 (L) 3.5 - 5.1 mmol/L    Comment: Performed at [facility], [place] [address] [place], [place] [address]  Creatinine, serum     Status: None   Collection Time: 07/22/20  4:21 AM  Result Value Ref Range   Creatinine, Ser 0.77 0.61 - 1.24 mg/dL   GFR calc non Af Amer >60 >60 mL/min   GFR calc Af Amer >60 >60 mL/min    Comment: Performed at [facility], [place] [address], [place],  [address]  Glucose, capillary     Status: None   Collection Time: 07/22/20  7:43 AM  Result Value Ref Range   Glucose-Capillary 93 70 - 99 mg/dL    Comment: Glucose reference range applies only to samples taken after fasting for at least 8 hours.    Imaging / Studies: DG Chest Port 1 View  Result Date: 07/22/2020 CLINICAL DATA:  Acute respiratory failure with hypoxemia EXAM: PORTABLE CHEST 1 VIEW COMPARISON:  Two days ago FINDINGS: Tracheal and esophageal extubation. Right-sided pleural catheter in stable position. Hazy opacity on the left more than right septal thickening, increased. Enlarged heart size. No visible pneumothorax. IMPRESSION: 1. Worsening airspace disease and left pleural effusion after extubation. 2. Right chest tube with no visible pneumothorax. Electronically Signed   By: Monte Fantasia M.D.   On: 07/22/2020 05:41    Medications / Allergies: per chart  Antibiotics: Anti-infectives (From admission, onward)   Start     Dose/Rate Route Frequency Ordered Stop   07/21/20 1200  ceFEPIme (MAXIPIME) 2 g in sodium chloride 0.9 % 100 mL IVPB     Discontinue     2 g 200 mL/hr over 30  Minutes Intravenous Every 8 hours 07/21/20 0829     07/20/20 1600  ceFEPIme (MAXIPIME) 2 g in sodium chloride 0.9 % 100 mL IVPB  Status:  Discontinued        2 g 200 mL/hr over 30 Minutes Intravenous Every 12 hours 07/20/20 0919  07/21/20 0829   07/20/20 0000  vancomycin (VANCOREADY) IVPB 750 mg/150 mL  Status:  Discontinued        750 mg 150 mL/hr over 60 Minutes Intravenous Every 12 hours 07/19/20 1021 07/21/20 0933   07/19/20 1200  ceFEPIme (MAXIPIME) 2 g in sodium chloride 0.9 % 100 mL IVPB  Status:  Discontinued        2 g 200 mL/hr over 30 Minutes Intravenous Every 8 hours 07/19/20 1021 07/20/20 0919   07/19/20 1100  vancomycin (VANCOCIN) IVPB 1000 mg/200 mL premix        1,000 mg 200 mL/hr over 60 Minutes Intravenous  Once 07/19/20 1021 07/19/20 1416   07/18/20 0100  cefoTEtan (CEFOTAN) 2 g in sodium chloride 0.9 % 100 mL IVPB        2 g 200 mL/hr over 30 Minutes Intravenous Every 12 hours 07/17/20 1721 07/18/20 1029   07/17/20 1400  neomycin (MYCIFRADIN) tablet 1,000 mg  Status:  Discontinued       "And" Linked Group Details   1,000 mg Oral 3 times per day 07/17/20 1207 07/17/20 1723   07/17/20 1400  metroNIDAZOLE (FLAGYL) tablet 1,000 mg  Status:  Discontinued       "And" Linked Group Details   1,000 mg Oral 3 times per day 07/17/20 1207 07/17/20 1723   07/17/20 1215  cefoTEtan (CEFOTAN) 2 g in sodium chloride 0.9 % 100 mL IVPB        2 g 200 mL/hr over 30 Minutes Intravenous On call to O.R. 07/17/20 1207 07/17/20 1312        Note: Portions of this report may have been transcribed using voice recognition software. Every effort was made to ensure accuracy; however, inadvertent computerized transcription errors may be present.   Any transcriptional errors that result from this process are unintentional.    Adin Hector, MD, FACS, MASCRS Gastrointestinal and Minimally Invasive Surgery  [facility] [address], [place], [place]  [address] [phone] Fax [phone] Main/Paging  CONTACT INFORMATION: Weekday (9AM-5PM) concerns: Call [facility] main office at [phone] Weeknight (5PM-9AM) or Weekend/Holiday concerns: Check [email or web address] for General Surgery [facility] coverage (Please, do not use SecureChat as it is not reliable communication to operating surgeons for immediate patient care)      07/22/2020  8:03 AM

## 2020-07-22 NOTE — Progress Notes (Signed)
RN contacted [facility] they paged Dr. Johney Maine with RN name and number. I am awaiting a call it was just to update about a bloody BM. PT. HR was brady the lowest at 48 during his nap as well. RN spoke with CCM we ordered a hgb it is back at 10.6 so higher from this mornings at 9.9. Pt. Is alert and oriented and on room air. Will remove central line per surgery and CCM.

## 2020-07-22 NOTE — Progress Notes (Signed)
Spoke to Dr. Vaughan Browner about left flank bruising to confirm it was still okay to transfer patient. CT was previously done to check for retroperitoneal bleed. Bruising was circled by RN. RN will continue to assess for any new bruising, tenderness, swelling, and pain.

## 2020-07-22 NOTE — TOC Progression Note (Signed)
Transition of Care [facility]) - Progression Note    Patient Details  Name: Timothy Shaffer MRN: [id number] Date of Birth: [birth year]  Transition of Care [facility]) CM/SW Contact  Leeroy Cha, RN Phone Number: 07/22/2020, 8:12 AM  Clinical Narrative:    Patient extubated on [id number] to [place] at 3l/min, iv maxipime, iv levophed and iv lr at 125cc/hrs, rt spontaneous pneumothorax with insertion of chest tube to h20 seal. WBC 15.0, Hgb 9.9, awake and stable.   Expected Discharge Plan: Home/Self Care Barriers to Discharge: Continued Medical Work up  Expected Discharge Plan and Services Expected Discharge Plan: Home/Self Care   Discharge Planning Services: CM Consult   Living arrangements for the past 2 months: Single Family Home                                       Social Determinants of Health (SDOH) Interventions    Readmission Risk Interventions No flowsheet data found.

## 2020-07-22 NOTE — Progress Notes (Signed)
Patient MEWS is yellow d/t RR. No PRN to admin. On call paged for orders. Patient otherwise is not in distress. O2 WNL on RA and he does not c/o SOB.

## 2020-07-23 LAB — CBC
HCT: 32.4 % — ABNORMAL LOW (ref 39.0–52.0)
Hemoglobin: 10.8 g/dL — ABNORMAL LOW (ref 13.0–17.0)
MCH: 34.8 pg — ABNORMAL HIGH (ref 26.0–34.0)
MCHC: 33.3 g/dL (ref 30.0–36.0)
MCV: 104.5 fL — ABNORMAL HIGH (ref 80.0–100.0)
Platelets: 224 10*3/uL (ref 150–400)
RBC: 3.1 MIL/uL — ABNORMAL LOW (ref 4.22–5.81)
RDW: 16.1 % — ABNORMAL HIGH (ref 11.5–15.5)
WBC: 16.5 10*3/uL — ABNORMAL HIGH (ref 4.0–10.5)
nRBC: 0 % (ref 0.0–0.2)

## 2020-07-23 LAB — GLUCOSE, CAPILLARY
Glucose-Capillary: 100 mg/dL — ABNORMAL HIGH (ref 70–99)
Glucose-Capillary: 106 mg/dL — ABNORMAL HIGH (ref 70–99)
Glucose-Capillary: 130 mg/dL — ABNORMAL HIGH (ref 70–99)
Glucose-Capillary: 150 mg/dL — ABNORMAL HIGH (ref 70–99)
Glucose-Capillary: 164 mg/dL — ABNORMAL HIGH (ref 70–99)
Glucose-Capillary: 78 mg/dL (ref 70–99)
Glucose-Capillary: 96 mg/dL (ref 70–99)

## 2020-07-23 LAB — CREATININE, SERUM
Creatinine, Ser: 0.64 mg/dL (ref 0.61–1.24)
GFR calc Af Amer: >60 mL/min (ref >60)
GFR calc non Af Amer: >60 mL/min (ref >60)

## 2020-07-23 LAB — POTASSIUM: Potassium: 4.1 mmol/L (ref 3.5–5.1)

## 2020-07-23 MED ORDER — ENSURE ENLIVE PO LIQD
237.0000 mL | Freq: Two times a day (BID) | ORAL | Status: DC
  Administered 2020-07-23 – 2020-07-24 (×3): 237 mL via ORAL

## 2020-07-23 MED ORDER — IPRATROPIUM-ALBUTEROL 0.5-2.5 (3) MG/3ML IN SOLN
3.0000 mL | Freq: Two times a day (BID) | RESPIRATORY_TRACT | Status: DC
  Administered 2020-07-23 – 2020-07-24 (×2): 3 mL via RESPIRATORY_TRACT
  Filled 2020-07-23 (×2): qty 3

## 2020-07-23 MED ORDER — OXYCODONE HCL 5 MG PO TABS: 5.0000 mg | ORAL_TABLET | ORAL | Status: DC | PRN

## 2020-07-23 NOTE — Progress Notes (Signed)
Initial Nutrition Assessment   INTERVENTION:   -Ensure Enlive po BID, each supplement provides 350 kcal and 20 grams of protein  NEW NUTRITION DIAGNOSIS:   Moderate Malnutrition related to chronic illness (large cecal mass, COPD) as evidenced by moderate fat depletion, moderate muscle depletion.  GOAL:   Patient will meet greater than or equal to 90% of their needs  Progressing.  MONITOR:   PO intake, Supplement acceptance, Labs, Weight trends, I & O's  ASSESSMENT:   The patient is a active smoking gentleman who is followed by Dr. Sheryn Bison.  Had declined colonoscopy offerred in 2009 age 69 in the past.  Had a positive colon Fit test.  Sent for endoscopy.  Numerous colon polyps found.  Small ones and rectosigmoid region removed.  Large bulky mass in cecum biopsied.  Felt not to be amenable to endoscopic resection.  Pathology consistent with adenomatous polyp.  Surgical consultation recommended by Dr. Michail Sermon to consider segmental resection.  8/11- s/p ROBOTIC PROXIMALHEMICOLECTOMY; ROBOTICLYSIS OF ADHESIONSX 60MIN (1/2 CASE); BILATERAL TAP BLOCK 8/12- rt chest tube placed secondary to pneumothorax 8/13- intubated 8/14- TF initiated 8/15- extubated  Patient in room, stating he is trying to eat but doesn't like the pureed diet. States he ate some of his eggs, grits and a juice.  Diet has now been advanced to soft diet.  Pt states he drinks Ensure at home, RD to order for patient in between meals.  Pt with increased needs following surgery and history of COPD and ETOH use.  Admission weight: 128 lbs. Current weight: 141 lbs.  I/Os: +6.4L since admit UOP: 750 ml x 24 hrs  Medications: Folic acid, Thiamine Labs reviewed:  CBGs: 96-130  NUTRITION - FOCUSED PHYSICAL EXAM:    Most Recent Value  Orbital Region Moderate depletion  Upper Arm Region Moderate depletion  Thoracic and Lumbar Region Unable to assess  Buccal Region Moderate depletion  Temple Region Moderate  depletion  Clavicle Bone Region Moderate depletion  Clavicle and Acromion Bone Region Moderate depletion  Scapular Bone Region Unable to assess  Dorsal Hand Unable to assess  Patellar Region Unable to assess  Anterior Thigh Region Unable to assess  Posterior Calf Region Unable to assess  Edema (RD Assessment) None       Diet Order:   Diet Order            DIET SOFT Room service appropriate? Yes; Fluid consistency: Thin  Diet effective now           Diet - low sodium heart healthy                 EDUCATION NEEDS:   No education needs have been identified at this time  Skin:  Skin Assessment: Skin Integrity Issues: Skin Integrity Issues:: Incisions, Other (Comment) Incisions: closed abdomen Other: lt foot fungal infection  Last BM:  8/17  Height:   Ht Readings from Last 1 Encounters:  07/17/20 5\' 11"  (1.803 m)    Weight:   Wt Readings from Last 1 Encounters:  07/21/20 64 kg    BMI:  Body mass index is 19.68 kg/m.  Estimated Nutritional Needs:   Kcal:  2050-2250  Protein:  110-125 grams  Fluid:  > 2 L   Clayton Bibles, MS, RD, LDN Inpatient Clinical Dietitian Contact information available via Amion

## 2020-07-23 NOTE — TOC Progression Note (Signed)
Transition of Care [facility]) - Progression Note    Patient Details  Name: Timothy Shaffer MRN: [id number] Date of Birth: 1951/11/04  Transition of Care [facility]) CM/SW Contact  Joaquin Courts, RN Phone Number: 07/23/2020, 12:09 PM  Clinical Narrative:    CM spoke with patient regarding ordered [place] and dme.  Patient reports he does not feel that he needs HHPT/OT services and feels that he is very active at home and does not need anyone coming out for therapy.  Patient also declines rolling walker with seat, states this is unnecessary, patient already has a rolling walker and wheelchair at home.    Expected Discharge Plan: Home/Self Care Barriers to Discharge: Continued Medical Work up  Expected Discharge Plan and Services Expected Discharge Plan: Home/Self Care   Discharge Planning Services: CM Consult Post Acute Care Choice: [place] arrangements for the past 2 months: Single Family Home                 DME Arranged: Patient refused services         HH Arranged: Patient Refused HH           Social Determinants of Health (SDOH) Interventions    Readmission Risk Interventions No flowsheet data found.

## 2020-07-23 NOTE — Care Management Important Message (Signed)
Important Message  Patient Details IM Letter given to the Patient Name: Timothy Shaffer MRN: [id number] Date of Birth: [birth year]   Medicare Important Message Given:  Yes     Kerin Salen 07/23/2020, 10:14 AM

## 2020-07-23 NOTE — Progress Notes (Signed)
6 Days Post-Op  Subjective: CC:  Doing well. Wants to go home. Some pain around incision site, otherwise denies pain. Requiring no PRN meds. Tolerating dys1 diet without n/v. Not eating much. Doesn't like the options. Passing flatus and having bm's. Worked with PT who recommended HH. Lives at home with his wife. Retired.   Objective: Vital signs in last 24 hours: Temp:  [97.5 F (36.4 C)-99.2 F (37.3 C)] 99 F (37.2 C) (08/17 0603) Pulse Rate:  [58-75] 72 (08/17 0603) Resp:  [17-33] 20 (08/17 0603) BP: (125-153)/(74-94) 137/83 (08/17 0603) SpO2:  [93 %-99 %] 95 % (08/17 0937) Last BM Date: 07/23/20  Intake/Output from previous day: 08/16 0701 - 08/17 0700 In: 1362.2 [P.O.:1110; I.V.:10; IV Piggyback:242.2] Out: 801 [Urine:801] Intake/Output this shift: Total I/O In: 260 [P.O.:60; IV Piggyback:200] Out: -   PE: Gen:  Alert, NAD, pleasant Card:  RRR Pulm:  Clear but distant breath sounds throughout. CT site with bandage in place. C/d/i Abd: Soft, ND, mild tenderness of the lower abdomen near incision site. Otherwise NT, +BS Ext:  No LE edema  Psych: A&Ox3  Skin: no rashes noted, warm and dry  Lab Results:  Recent Labs    07/22/20 0421 07/22/20 0421 07/22/20 1144 07/23/20 0523  WBC 15.0*  --   --  16.5*  HGB 9.9*   < > 10.6* 10.8*  HCT 29.6*   < > 31.0* 32.4*  PLT 205  --   --  224   < > = values in this interval not displayed.   BMET Recent Labs    07/21/20 0439 07/21/20 0439 07/22/20 0421 07/23/20 0523  NA 140  --   --   --   K 3.4*   < > 3.4* 4.1  CL 112*  --   --   --   CO2 20*  --   --   --   GLUCOSE 113*  --   --   --   BUN 22  --   --   --   CREATININE 0.87   < > 0.77 0.64  CALCIUM 8.6*  --   --   --    < > = values in this interval not displayed.   PT/INR No results for input(s): LABPROT, INR in the last 72 hours. CMP     Component Value Date/Time   NA 140 07/21/2020 0439   K 4.1 07/23/2020 0523   CL 112 (H) 07/21/2020 0439   CO2 20  (L) 07/21/2020 0439   GLUCOSE 113 (H) 07/21/2020 0439   BUN 22 07/21/2020 0439   CREATININE 0.64 07/23/2020 0523   CALCIUM 8.6 (L) 07/21/2020 0439   PROT 4.7 (L) 07/20/2020 0413   ALBUMIN 2.4 (L) 07/20/2020 0413   AST 21 07/20/2020 0413   ALT 16 07/20/2020 0413   ALKPHOS 41 07/20/2020 0413   BILITOT 0.8 07/20/2020 0413   GFRNONAA >60 07/23/2020 0523   GFRAA >60 07/23/2020 0523   Lipase  No results found for: LIPASE     Studies/Results: DG Chest Port 1 View  Result Date: 07/22/2020 CLINICAL DATA:  69 year old male status post chest tube removal. EXAM: PORTABLE CHEST 1 VIEW COMPARISON:  Chest x-ray 07/22/2020. FINDINGS: Previously noted right-sided small bore chest tube has been removed. Right subclavian central venous catheter with tip terminating at the superior vena cava. Patchy areas of interstitial prominence and ground-glass attenuation noted in the lungs bilaterally, as well as more dense airspace consolidation in the left lower lobe.  Small left pleural effusion. No definite right pleural effusion. Heart size is borderline enlarged. Upper mediastinal contours are within normal limits. Aortic atherosclerosis. IMPRESSION: 1. Support apparatus, as above. 2. The appearance of the lungs is most compatible with multilobar bilateral pneumonia, most severe in the left lower lobe where there is also a small left parapneumonic pleural effusion. Electronically Signed   By: Vinnie Langton M.D.   On: 07/22/2020 13:06   DG Chest Port 1 View  Result Date: 07/22/2020 CLINICAL DATA:  Acute respiratory failure with hypoxemia EXAM: PORTABLE CHEST 1 VIEW COMPARISON:  Two days ago FINDINGS: Tracheal and esophageal extubation. Right-sided pleural catheter in stable position. Hazy opacity on the left more than right septal thickening, increased. Enlarged heart size. No visible pneumothorax. IMPRESSION: 1. Worsening airspace disease and left pleural effusion after extubation. 2. Right chest tube with no  visible pneumothorax. Electronically Signed   By: Monte Fantasia M.D.   On: 07/22/2020 05:41    Anti-infectives: Anti-infectives (From admission, onward)   Start     Dose/Rate Route Frequency Ordered Stop   07/21/20 1200  ceFEPIme (MAXIPIME) 2 g in sodium chloride 0.9 % 100 mL IVPB     Discontinue     2 g 200 mL/hr over 30 Minutes Intravenous Every 8 hours 07/21/20 0829     07/20/20 1600  ceFEPIme (MAXIPIME) 2 g in sodium chloride 0.9 % 100 mL IVPB  Status:  Discontinued        2 g 200 mL/hr over 30 Minutes Intravenous Every 12 hours 07/20/20 0919 07/21/20 0829   07/20/20 0000  vancomycin (VANCOREADY) IVPB 750 mg/150 mL  Status:  Discontinued        750 mg 150 mL/hr over 60 Minutes Intravenous Every 12 hours 07/19/20 1021 07/21/20 0933   07/19/20 1200  ceFEPIme (MAXIPIME) 2 g in sodium chloride 0.9 % 100 mL IVPB  Status:  Discontinued        2 g 200 mL/hr over 30 Minutes Intravenous Every 8 hours 07/19/20 1021 07/20/20 0919   07/19/20 1100  vancomycin (VANCOCIN) IVPB 1000 mg/200 mL premix        1,000 mg 200 mL/hr over 60 Minutes Intravenous  Once 07/19/20 1021 07/19/20 1416   07/18/20 0100  cefoTEtan (CEFOTAN) 2 g in sodium chloride 0.9 % 100 mL IVPB        2 g 200 mL/hr over 30 Minutes Intravenous Every 12 hours 07/17/20 1721 07/18/20 1029   07/17/20 1400  neomycin (MYCIFRADIN) tablet 1,000 mg  Status:  Discontinued       "And" Linked Group Details   1,000 mg Oral 3 times per day 07/17/20 1207 07/17/20 1723   07/17/20 1400  metroNIDAZOLE (FLAGYL) tablet 1,000 mg  Status:  Discontinued       "And" Linked Group Details   1,000 mg Oral 3 times per day 07/17/20 1207 07/17/20 1723   07/17/20 1215  cefoTEtan (CEFOTAN) 2 g in sodium chloride 0.9 % 100 mL IVPB        2 g 200 mL/hr over 30 Minutes Intravenous On call to O.R. 07/17/20 1207 07/17/20 1312       Assessment/Plan AKI - resolved w/ IVF Tobacco abuse Hx of heavy ETOH use - CIWA Hx PUD/perforated duodenal ulcer  05/1992 Hematoma left hip - hgb stable  Spontaneous Tension PTX - resolve. CT out.  Possible PNA - ABx and duration per CCM Hypergylcemia - SSI Abl anemia - hgb 10.8 and stable  Cecal colon polyp unresectable by  colonoscopy Robotic proximal hemicolectomy, robotic lysis of adhesions x60 minutes, bilateral tap block, 07/17/2020 Dr. Bowen Kia [place] POD #6 - Adv diet - Path without evidence of malignancy, full report as noted below  - Pulm toilet  - Mobilize. Cont PT. Recommending HH. Hopefully home soon  Surgical Path A. COLON, PROXIMAL RIGHT, RESECTION:  - Colon with tubular adenomas, 5.0 cm (largest)  - Twenty-three benign lymph nodes (0/2 3)  - Benign appendix  - Margins free of neoplasia  - No high-grade dysplasia or malignancy identified  - See comment   FEN: Soft  ID: [email or web address] 8/11 >>  DVT: SCDs, Lovenox Follow-up: Dr. Johney Maine   LOS: 6 days    Jillyn Ledger , [facility] 07/23/2020, 11:27 AM Please see Amion for pager number during day hours 7:00am-4:30pm

## 2020-07-24 DIAGNOSIS — E440 Moderate protein-calorie malnutrition: Secondary | ICD-10-CM | POA: Insufficient documentation

## 2020-07-24 LAB — CBC
HCT: 33 % — ABNORMAL LOW (ref 39.0–52.0)
Hemoglobin: 11 g/dL — ABNORMAL LOW (ref 13.0–17.0)
MCH: 34.4 pg — ABNORMAL HIGH (ref 26.0–34.0)
MCHC: 33.3 g/dL (ref 30.0–36.0)
MCV: 103.1 fL — ABNORMAL HIGH (ref 80.0–100.0)
Platelets: 285 10*3/uL (ref 150–400)
RBC: 3.2 MIL/uL — ABNORMAL LOW (ref 4.22–5.81)
RDW: 15.2 % (ref 11.5–15.5)
WBC: 14.5 10*3/uL — ABNORMAL HIGH (ref 4.0–10.5)
nRBC: 0 % (ref 0.0–0.2)

## 2020-07-24 LAB — CREATININE, SERUM
Creatinine, Ser: 0.77 mg/dL (ref 0.61–1.24)
GFR calc Af Amer: >60 mL/min (ref >60)
GFR calc non Af Amer: >60 mL/min (ref >60)

## 2020-07-24 LAB — GLUCOSE, CAPILLARY
Glucose-Capillary: 91 mg/dL (ref 70–99)
Glucose-Capillary: 92 mg/dL (ref 70–99)
Glucose-Capillary: 94 mg/dL (ref 70–99)

## 2020-07-24 LAB — POTASSIUM: Potassium: 3.5 mmol/L (ref 3.5–5.1)

## 2020-07-24 MED ORDER — THIAMINE HCL 100 MG PO TABS
100.0000 mg | ORAL_TABLET | Freq: Every day | ORAL | Status: DC
  Administered 2020-07-24: 100 mg via ORAL
  Filled 2020-07-24: qty 1

## 2020-07-24 NOTE — Progress Notes (Signed)
Occupational Therapy Progress Note  Patient anxious to get out of hospital, wants to go home. Agreeable to functional ambulation with rolling walker. Patient supervision level for safety navigating in room with walker, min cue for safety with eccentric control into recliner after functional ambulation in hallway. Cues for pursed lip breathing technique due to audible labored breathing.     07/24/20 1300  OT Visit Information  Last OT Received On 07/24/20  Assistance Needed +1  History of Present Illness 69 year old male admitted 8/11 for robotic colectomy. On 8/12 developed acute hypoxia in setting of spontaneous tension right PTX-chest tube. Inubated and now extubated (8/15). Chest tube pulled 8/16  Precautions  Precautions Fall  Precaution Comments monitor VS  Pain Assessment  Pain Assessment No/denies pain  Cognition  Arousal/Alertness Awake/alert  Behavior During Therapy WFL for tasks assessed/performed  Overall Cognitive Status Within Functional Limits for tasks assessed  ADL  Overall ADL's  Needs assistance/impaired  Lower Body Dressing Supervision/safety;Sitting/lateral leans  Lower Body Dressing Details (indicate cue type and reason) patient able to pull up socks seated EOB  Toilet Transfer Supervision/safety;Ambulation;RW  Functional mobility during ADLs Supervision/safety;Rolling walker  Bed Mobility  Overal bed mobility Modified Independent  Balance  Overall balance assessment Needs assistance  Sitting-balance support No upper extremity supported;Feet supported  Sitting balance-Leahy Scale Good  Standing balance support During functional activity;No upper extremity supported  Standing balance-Leahy Scale Fair  Standing balance comment can stand EOB without walker, uses with ambulation in hall  Restrictions  Weight Bearing Restrictions No  Transfers  Overall transfer level Needs assistance  Equipment used Rolling walker (2 wheeled)  Transfers Sit to/from Stand  Sit  to Stand Supervision  General transfer comment no physical assistance required, supervision for safety navigating in room with walker. min cue for safety with eccentric control into recliner  General Comments  General comments (skin integrity, edema, etc.) cue in pursed lip breathing techniques with functional ambulation due to audible shortness of breath  OT - End of Session  Equipment Utilized During Treatment Rolling walker  Activity Tolerance Patient tolerated treatment well  Patient left in chair;with call bell/phone within reach  Nurse Communication Mobility status  OT Assessment/Plan  OT Plan Discharge plan remains appropriate  OT Visit Diagnosis Unsteadiness on feet (R26.81);Other abnormalities of gait and mobility (R26.89);Muscle weakness (generalized) (M62.81)  OT Frequency (ACUTE ONLY) Min 2X/week  Follow Up Recommendations Home health OT;Supervision - Intermittent  OT Equipment None recommended by OT  AM-PAC OT "6 Clicks" Daily Activity Outcome Measure (Version 2)  Help from another person eating meals? 4  Help from another person taking care of personal grooming? 3  Help from another person toileting, which includes using toliet, bedpan, or urinal? 3  Help from another person bathing (including washing, rinsing, drying)? 3  Help from another person to put on and taking off regular upper body clothing? 3  Help from another person to put on and taking off regular lower body clothing? 3  6 Click Score 19  OT Goal Progression  Progress towards OT goals Progressing toward goals  Acute Rehab OT Goals  Patient Stated Goal to go home and take care of my animals (chickens, goats, pot belly pigs)  OT Goal Formulation With patient  Time For Goal Achievement 08/05/20  Potential to Achieve Goals Good  ADL Goals  Pt Will Perform Grooming Independently;standing  Pt Will Perform Upper Body Bathing Independently;sitting;standing  Pt Will Perform Lower Body Bathing Independently;sit  to/from stand  Pt Will  Perform Upper Body Dressing Independently;sitting;standing  Pt Will Perform Lower Body Dressing Independently;sit to/from stand  Pt Will Transfer to Toilet with modified independence;ambulating;regular height toilet;grab bars (RW)  Pt Will Perform Toileting - Clothing Manipulation and hygiene Independently;sit to/from stand  Additional ADL Goal #1 Pt will be aware of energy conservation strategies from handout that will be of benefit to him  OT Time Calculation  OT Start Time (ACUTE ONLY) 1014  OT Stop Time (ACUTE ONLY) 1025  OT Time Calculation (min) 11 min  OT General Charges  $OT Visit 1 Visit  OT Treatments  $Self Care/Home Management  8-22 mins   Timothy Shaffer OT OT pager: [phone]

## 2020-07-24 NOTE — Discharge Summary (Addendum)
Physician Discharge Summary    Patient ID: Timothy Shaffer MRN: [id number] DOB/AGE: 70-13-1952  70 y.o.  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 Active Problems:   Spontaneous tension pneumothorax s/p chest tube decompression 07/18/2020   PUD (peptic ulcer disease)   Tobacco abuse   Moderate alcohol consumption   Mass of cecum   Hyperglycemia   Acute respiratory failure ([place])   Pneumonia   Shock circulatory ([place])   Malnutrition of moderate degree   7 Days Post-Op  07/17/2020  POST-OPERATIVE DIAGNOSIS:   COLON POLYP UNRESECTABLE BY COLONOSCOPY  SURGERY:  07/17/2020  Procedure(s): ROBOTIC PROXIMAL COLECTOMY, LYSIS OF ADHESIONS, BILATERAL TAP BLOCK  SURGEON:    Surgeon(s): Michael Boston, MD Ileana Roup, MD  Consults: None  Hospital Course:   The patient underwent  the surgery above.  Postoperatively, the patient was placed on a CIWA alcohol withdrawal protocol. Somewhat sleepy but doing okay. Then became hypoxic on postop day 2. X-ray showed a tension pneumothorax most likely due to a spontaneously rupturing bleb. He was transferred to the ICU. Critical care medicine was consulted. Pigtail chest tube catheter placed with resolution. Seem to stabilize. X-ray concerning for possible pneumonia versus collapse on the left side. Felt worse. Was electively intubated with bronchoscopy to remove numerous mucous plugs. No evidence of aspiration. Stabilized on the ventilator for a few days. Placed on tube feeds.  He improved and was extubated. Had bowel function. No  respiratory or cardiac failure. Placed on IV antibiotics as a hedge for possible pneumonia. Had some ecchymosis and anemia. Stabilized enough to transfer to back to the floor. Pathology consistent with adenomatous polyp. Gradually mobilized and advanced to a solid diet.  Pain and other symptoms were treated aggressively.    By the time of discharge, the patient was walking with help in the hallways. eating food, having flatus. Physical and Occupational Therapy were involved. They recommended home health. Patient had mild interest in it. Equipment was provided for him. He claimed he had good family and neighborhood support for him and he wished to go home Based on meeting discharge criteria and continuing to recover, I felt it was safe for the patient to be discharged from the hospital to further recover with close followup. Postoperative recommendations were discussed in detail.  They are written as well.  Discharged Condition: fair  Discharge Exam: Blood pressure 140/77, pulse 78, temperature 98.5 F (36.9 C), temperature source Oral, resp. rate 18, height 5\' 11"  (1.803 m), weight 64 kg, SpO2 96 %.  General: Pt awake/alert/oriented x4 in No acute distress Eyes: PERRL, normal EOM.  Sclera clear.  No icterus Neuro: CN II-XII intact w/o focal sensory/motor deficits. Lymph: No head/neck/groin lymphadenopathy Psych:  No delerium/psychosis/paranoia HENT: Normocephalic, Mucus membranes moist.  No thrush Neck: Supple, No tracheal deviation Chest: No chest wall pain w good excursion  CTA B on room air CV:  Pulses intact.  Regular rhythm MS: Normal AROM mjr joints.  No obvious deformity Abdomen: Soft.  Mildly distended.  Nontender.  No evidence of peritonitis.  No incarcerated hernias. Ext:  SCDs BLE.  No mjr edema.  No cyanosis Skin: No petechiae /  purpura   Disposition:    Follow-up Information    Michael Boston, MD. Schedule an appointment as soon as possible for a visit in 3 weeks.     Specialty: General Surgery Why: To follow up after your operation Contact information: [place] [address] [phone]                 Discharge Instructions    Call MD for:   Complete by: As directed    FEVER > 101.5 F  (temperatures < 101.5 F are not significant)   Call MD for:  extreme fatigue   Complete by: As directed    Call MD for:  persistant dizziness or light-headedness   Complete by: As directed    Call MD for:  persistant nausea and vomiting   Complete by: As directed    Call MD for:  redness, tenderness, or signs of infection (pain, swelling, redness, odor or green/yellow discharge around incision site)   Complete by: As directed    Call MD for:  severe uncontrolled pain   Complete by: As directed    Diet - low sodium heart healthy   Complete by: As directed    Start with a bland diet such as soups, liquids, starchy foods, low fat foods, etc. the first few days at home. Gradually advance to a solid, low-fat, high fiber diet by the end of the first week at home.   Add a fiber supplement to your diet (Metamucil, etc) If you feel full, bloated, or constipated, stay on a full liquid or pureed/blenderized diet for a few days until you feel better and are no longer constipated.   Discharge instructions   Complete by: As directed    See Discharge Instructions If you are not getting better after two weeks or are noticing you are getting worse, contact our office [phone] for further advice.  We may need to adjust your medications, re-evaluate you in the office, send you to the emergency room, or see what other things we can do to help. The clinic staff is available to answer your questions during regular business hours (8:30am-5pm).  Please don't hesitate to call and ask to speak to one of our nurses for clinical concerns.    A surgeon from [facility] is always on call at the hospitals 24 hours/day If you have a  medical emergency, go to the nearest emergency room or call 911.   Discharge wound care:   Complete by: As directed    It is good for closed incisions and even open wounds to be washed every day.  Shower every day.  Short baths are fine.  Wash the incisions and wounds clean with soap & water.    You may leave closed incisions open to air if it is dry.   You may cover the incision with clean gauze & replace it after your daily shower for comfort.   Driving Restrictions   Complete by: As directed    You may drive when: - you are no longer taking narcotic prescription pain medication - you can comfortably wear a seatbelt - you can safely make sudden turns/stops without pain.   Increase activity slowly   Complete by: As directed    Start light daily activities --- self-care, walking, climbing stairs- beginning the day after surgery.  Gradually increase activities as tolerated.  Control your pain to be active.  Stop when you are tired.  Ideally, walk several  times a day, eventually an hour a day.   Most people are back to most day-to-day activities in a few weeks.  It takes 4-6 weeks to get back to unrestricted, intense activity. If you can walk 30 minutes without difficulty, it is safe to try more intense activity such as jogging, treadmill, bicycling, low-impact aerobics, swimming, etc. Save the most intensive and strenuous activity for last (Usually 4-8 weeks after surgery) such as sit-ups, heavy lifting, contact sports, etc.  Refrain from any intense heavy lifting or straining until you are off narcotics for pain control.  You will have off days, but things should improve week-by-week. DO NOT PUSH THROUGH PAIN.  Let pain be your guide: If it hurts to do something, don't do it.   Lifting restrictions   Complete by: As directed    If you can walk 30 minutes without difficulty, it is safe to try more intense activity such as jogging, treadmill, bicycling, low-impact aerobics, swimming, etc. Save the  most intensive and strenuous activity for last (Usually 4-8 weeks after surgery) such as sit-ups, heavy lifting, contact sports, etc.   Refrain from any intense heavy lifting or straining until you are off narcotics for pain control.  You will have off days, but things should improve week-by-week. DO NOT PUSH THROUGH PAIN.  Let pain be your guide: If it hurts to do something, don't do it.  Pain is your body warning you to avoid that activity for another week until the pain goes down.   May shower / Bathe   Complete by: As directed    May walk up steps   Complete by: As directed    Remove dressing in 72 hours   Complete by: As directed    Make sure all dressings are removed by the third day after surgery.  Leave incisions open to air.  OK to cover incisions with gauze or bandages as desired   Sexual Activity Restrictions   Complete by: As directed    You may have sexual intercourse when it is comfortable. If it hurts to do something, stop.      Allergies as of 07/24/2020      Reactions   Nsaids    History of perforated ulcer      Medication List    TAKE these medications   albuterol 108 (90 Base) MCG/ACT inhaler Commonly known as: VENTOLIN HFA Inhale 1-2 puffs into the lungs every 6 (six) hours as needed for wheezing or shortness of breath.   traMADol 50 MG tablet Commonly known as: ULTRAM Take 1-2 tablets (50-100 mg total) by mouth every 6 (six) hours as needed for moderate pain or severe pain.            Durable Medical Equipment  (From admission, onward)         Start     Ordered   07/22/20 1817  For home use only DME 4 wheeled rolling walker with seat  Once       Question:  Patient needs a walker to treat with the following condition  Answer:  Balance problem   07/22/20 1816           Discharge Care Instructions  (From admission, onward)         Start     Ordered   07/24/20 0000  Discharge wound care:       Comments: It is good for closed incisions and  even open wounds to be washed every day.  Shower every  day.  Short baths are fine.  Wash the incisions and wounds clean with soap & water.    You may leave closed incisions open to air if it is dry.   You may cover the incision with clean gauze & replace it after your daily shower for comfort.   07/24/20 1048   07/17/20 0000  Discharge wound care:       Comments: It is good for closed incisions and even open wounds to be washed every day.  Shower every day.  Short baths are fine.  Wash the incisions and wounds clean with soap & water.    You may leave closed incisions open to air if it is dry.   You may cover the incision with clean gauze & replace it after your daily shower for comfort.   07/17/20 1250          Significant Diagnostic Studies:  Results for orders placed or performed during the hospital encounter of 07/17/20 (from the past 72 hour(s))  Glucose, capillary     Status: None   Collection Time: 07/21/20 12:37 PM  Result Value Ref Range   Glucose-Capillary 97 70 - 99 mg/dL    Comment: Glucose reference range applies only to samples taken after fasting for at least 8 hours.   Comment 1 Notify RN    Comment 2 Document in Chart   Glucose, capillary     Status: Abnormal   Collection Time: 07/21/20  7:50 PM  Result Value Ref Range   Glucose-Capillary 122 (H) 70 - 99 mg/dL    Comment: Glucose reference range applies only to samples taken after fasting for at least 8 hours.   Comment 1 Notify RN    Comment 2 Document in Chart   Glucose, capillary     Status: None   Collection Time: 07/21/20 11:04 PM  Result Value Ref Range   Glucose-Capillary 70 70 - 99 mg/dL    Comment: Glucose reference range applies only to samples taken after fasting for at least 8 hours.   Comment 1 Notify RN    Comment 2 Document in Chart   Glucose, capillary     Status: None   Collection Time: 07/22/20  3:59 AM  Result Value Ref Range   Glucose-Capillary 97 70 - 99 mg/dL    Comment: Glucose reference  range applies only to samples taken after fasting for at least 8 hours.   Comment 1 Notify RN    Comment 2 Document in Chart   CBC     Status: Abnormal   Collection Time: 07/22/20  4:21 AM  Result Value Ref Range   WBC 15.0 (H) 4.0 - 10.5 K/uL   RBC 2.92 (L) 4.22 - 5.81 MIL/uL   Hemoglobin 9.9 (L) 13.0 - 17.0 g/dL   HCT 29.6 (L) 39 - 52 %   MCV 101.4 (H) 80.0 - 100.0 fL   MCH 33.9 26.0 - 34.0 pg   MCHC 33.4 30.0 - 36.0 g/dL   RDW 16.6 (H) 11.5 - 15.5 %   Platelets 205 150 - 400 K/uL   nRBC 0.0 0.0 - 0.2 %    Comment: Performed at [facility], [place] [address] [place], [place] [address]  Potassium     Status: Abnormal   Collection Time: 07/22/20  4:21 AM  Result Value Ref Range   Potassium 3.4 (L) 3.5 - 5.1 mmol/L    Comment: Performed at [facility], [place] [address], [place], [place] [address]  Creatinine, serum     Status: None   Collection Time: 07/22/20  4:21 AM  Result Value Ref Range   Creatinine, Ser 0.77 0.61 - 1.24 mg/dL   GFR calc non Af Amer >60 >60 mL/min   GFR calc Af Amer >60 >60 mL/min    Comment: Performed at [facility], [place] [address], [place], [place] [address]  Glucose, capillary     Status: None   Collection Time: 07/22/20  7:43 AM  Result Value Ref Range   Glucose-Capillary 93 70 - 99 mg/dL    Comment: Glucose reference range applies only to samples taken after fasting for at least 8 hours.  Glucose, capillary     Status: Abnormal   Collection Time: 07/22/20 11:35 AM  Result Value Ref Range   Glucose-Capillary 107 (H) 70 - 99 mg/dL    Comment: Glucose reference range applies only to samples taken after fasting for at least 8 hours.  Hemoglobin and hematocrit, blood     Status: Abnormal   Collection Time: 07/22/20 11:44 AM  Result Value Ref Range   Hemoglobin 10.6 (L) 13.0 - 17.0 g/dL   HCT 31.0 (L) 39 - 52 %    Comment: Performed at [facility], [place] [address],  [place], [place] [address]  Glucose, capillary     Status: Abnormal   Collection Time: 07/22/20  3:45 PM  Result Value Ref Range   Glucose-Capillary 138 (H) 70 - 99 mg/dL    Comment: Glucose reference range applies only to samples taken after fasting for at least 8 hours.  Glucose, capillary     Status: Abnormal   Collection Time: 07/22/20  7:35 PM  Result Value Ref Range   Glucose-Capillary 110 (H) 70 - 99 mg/dL    Comment: Glucose reference range applies only to samples taken after fasting for at least 8 hours.  Glucose, capillary     Status: Abnormal   Collection Time: 07/22/20 11:37 PM  Result Value Ref Range   Glucose-Capillary 100 (H) 70 - 99 mg/dL    Comment: Glucose reference range applies only to samples taken after fasting for at least 8 hours.  Glucose, capillary     Status: Abnormal   Collection Time: 07/23/20  3:51 AM  Result Value Ref Range   Glucose-Capillary 100 (H) 70 - 99 mg/dL    Comment: Glucose reference range applies only to samples taken after fasting for at least 8 hours.  CBC     Status: Abnormal   Collection Time: 07/23/20  5:23 AM  Result Value Ref Range   WBC 16.5 (H) 4.0 - 10.5 K/uL   RBC 3.10 (L) 4.22 - 5.81 MIL/uL   Hemoglobin 10.8 (L) 13.0 - 17.0 g/dL   HCT 32.4 (L) 39 - 52 %   MCV 104.5 (H) 80.0 - 100.0 fL   MCH 34.8 (H) 26.0 - 34.0 pg   MCHC 33.3 30.0 - 36.0 g/dL   RDW 16.1 (H) 11.5 - 15.5 %   Platelets 224 150 - 400 K/uL   nRBC 0.0 0.0 - 0.2 %    Comment: Performed at [facility], [place] [address] [place], [place] [address]  Potassium     Status: None   Collection Time: 07/23/20  5:23 AM  Result Value Ref Range   Potassium 4.1 3.5 - 5.1 mmol/L    Comment: DELTA CHECK NOTED SLIGHT HEMOLYSIS Performed at [place] [address], [place], [place] [address]  Creatinine, serum     Status: None   Collection Time: 07/23/20  5:23 AM  Result Value Ref Range   Creatinine, Ser 0.64 0.61 - 1.24 mg/dL   GFR  calc non Af Amer >60 >60 mL/min   GFR calc Af Amer >60 >60 mL/min    Comment: Performed at [facility], [place] [address], [place], [place] [address]  Glucose, capillary     Status: None   Collection Time: 07/23/20  7:55 AM  Result Value Ref Range   Glucose-Capillary 96 70 - 99 mg/dL    Comment: Glucose reference range applies only to samples taken after fasting for at least 8 hours.  Glucose, capillary     Status: Abnormal   Collection Time: 07/23/20 11:55 AM  Result Value Ref Range   Glucose-Capillary 130 (H) 70 - 99 mg/dL    Comment: Glucose reference range applies only to samples taken after fasting for at least 8 hours.  Glucose, capillary     Status: Abnormal   Collection Time: 07/23/20  3:55 PM  Result Value Ref Range   Glucose-Capillary 106 (H) 70 - 99 mg/dL    Comment: Glucose reference range applies only to samples taken after fasting for at least 8 hours.  Glucose, capillary     Status: Abnormal   Collection Time: 07/23/20  7:25 PM  Result Value Ref Range   Glucose-Capillary 164 (H) 70 - 99 mg/dL    Comment: Glucose reference range applies only to samples taken after fasting for at least 8 hours.  Glucose, capillary     Status: Abnormal   Collection Time: 07/23/20  7:59 PM  Result Value Ref Range   Glucose-Capillary 150 (H) 70 - 99 mg/dL    Comment: Glucose reference range applies only to samples taken after fasting for at least 8 hours.  Glucose, capillary     Status: None   Collection Time: 07/23/20 11:31 PM  Result Value Ref Range   Glucose-Capillary 78 70 - 99 mg/dL    Comment: Glucose reference range applies only to samples taken after fasting for at least 8 hours.  Glucose, capillary     Status: None   Collection Time: 07/24/20  3:50 AM  Result Value Ref Range   Glucose-Capillary 91 70 - 99 mg/dL    Comment: Glucose reference range applies only to samples taken after fasting for at least 8 hours.  Creatinine, serum     Status: None   Collection  Time: 07/24/20  5:12 AM  Result Value Ref Range   Creatinine, Ser 0.77 0.61 - 1.24 mg/dL   GFR calc non Af Amer >60 >60 mL/min   GFR calc Af Amer >60 >60 mL/min    Comment: Performed at [facility], [place] [address] [place], [place] [address]  CBC     Status: Abnormal   Collection Time: 07/24/20  5:12 AM  Result Value Ref Range   WBC 14.5 (H) 4.0 - 10.5 K/uL   RBC 3.20 (L) 4.22 - 5.81 MIL/uL   Hemoglobin 11.0 (L) 13.0 - 17.0 g/dL   HCT 33.0 (L) 39 - 52 %   MCV 103.1 (H) 80.0 - 100.0 fL   MCH 34.4 (H) 26.0 - 34.0 pg   MCHC 33.3 30.0 - 36.0 g/dL   RDW 15.2 11.5 - 15.5 %   Platelets 285 150 - 400 K/uL   nRBC 0.0 0.0 - 0.2 %    Comment: Performed at [facility], [place]  [address] [place], [place] [address]  Potassium     Status: None   Collection Time: 07/24/20  5:12 AM  Result Value Ref Range   Potassium 3.5 3.5 - 5.1 mmol/L    Comment: Performed at [facility], [place] [address], [place], [place] [address]  Glucose, capillary     Status: None   Collection Time: 07/24/20  7:46 AM  Result Value Ref Range   Glucose-Capillary 92 70 - 99 mg/dL    Comment: Glucose reference range applies only to samples taken after fasting for at least 8 hours.    No results found.  Past Medical History:  Diagnosis Date  . Asthma    allergies  . Complication of anesthesia   . Family history of adverse reaction to anesthesia    PONV  . Perforated duodenal ulcer ([place]) 05/21/1992   Emergency surgery at [facility], [place], [place]  . PONV (postoperative nausea and vomiting)   . PUD (peptic ulcer disease)     Past Surgical History:  Procedure Laterality Date  . COLONOSCOPY  04/2020  . MASS EXCISION Left 04/24/2014   Procedure: LEFT LONG & RING FINGERS DISTAL INTERPHALANGEAL JOINT/CYST EXCISION;  Surgeon: Cammie Sickle., MD;  Location: [facility];  Service: Orthopedics;  Laterality: Left;  . REPAIR OF PERFORATED ULCER  1993   [facility],  [place], [place]    Social History   Socioeconomic History  . Marital status: Married    Spouse name: Not on file  . Number of children: Not on file  . Years of education: Not on file  . Highest education level: Not on file  Occupational History  . Not on file  Tobacco Use  . Smoking status: Current Every Day Smoker    Packs/day: 1.00    Years: 15.00    Pack years: 15.00    Types: Cigarettes  . Smokeless tobacco: Never Used  Vaping Use  . Vaping Use: Never used  Substance and Sexual Activity  . Alcohol use: Yes    Alcohol/week: 12.0 standard drinks    Types: 12 Cans of beer per week  . Drug use: Yes    Types: Marijuana  . Sexual activity: Not on file  Other Topics Concern  . Not on file  Social History Narrative  . Not on file   Social Determinants of Health   Financial Resource Strain:   . Difficulty of Paying Living Expenses:   Food Insecurity:   . Worried About Charity fundraiser in the Last Year:   . Arboriculturist in the Last Year:   Transportation Needs:   . Film/video editor (Medical):   Marland Kitchen Lack of Transportation (Non-Medical):   Physical Activity:   . Days of Exercise per Week:   . Minutes of Exercise per Session:   Stress:   . Feeling of Stress :   Social Connections:   . Frequency of Communication with Friends and Family:   . Frequency of Social Gatherings with Friends and Family:   . Attends Religious Services:   . Active Member of Clubs or Organizations:   . Attends Archivist Meetings:   Marland Kitchen Marital Status:   Intimate Partner Violence:   . Fear of Current or Ex-Partner:   . Emotionally Abused:   Marland Kitchen Physically Abused:   . Sexually Abused:     History reviewed. No pertinent family history.  Current Facility-Administered Medications  Medication Dose Route Frequency Provider Last Rate Last Admin  . 0.9 %  sodium chloride infusion (Manually program via Guardrails IV Fluids)   Intravenous Once Michael Boston, MD   Held at 07/20/20 [phone]   . 0.9 %  sodium chloride infusion (Manually program via Guardrails IV Fluids)   Intravenous Once Michael Boston, MD   Held at 07/20/20 [phone]  . acetaminophen (TYLENOL) tablet 1,000 mg  1,000 mg Oral Lajuana Ripple, MD   1,000 mg at 07/24/20 0546  . albuterol (PROVENTIL) (2.5 MG/3ML) 0.083% nebulizer solution 2.5 mg  2.5 mg Nebulization Q4H PRN Stark Klein, MD      . ceFEPIme (MAXIPIME) 2 g in sodium chloride 0.9 % 100 mL IVPB  2 g Intravenous Tor Netters, MD 200 mL/hr at 07/24/20 0352 2 g at 07/24/20 0352  . Chlorhexidine Gluconate Cloth 2 % PADS 6 each  6 each Topical Daily Michael Boston, MD   6 each at 07/22/20 [phone]  . enoxaparin (LOVENOX) injection 40 mg  40 mg Subcutaneous Q24H Michael Boston, MD   40 mg at 07/23/20 1238  . feeding supplement (ENSURE ENLIVE) (ENSURE ENLIVE) liquid 237 mL  237 mL Oral BID BM Michael Boston, MD   237 mL at 07/24/20 0905  . folic acid (FOLVITE) tablet 1 mg  1 mg Oral Daily Michael Boston, MD   1 mg at 07/24/20 0901  . insulin aspart (novoLOG) injection 0-9 Units  0-9 Units Subcutaneous Dorise Hiss, MD   2 Units at 07/23/20 1935  . ipratropium-albuterol (DUONEB) 0.5-2.5 (3) MG/3ML nebulizer solution 3 mL  3 mL Nebulization BID Michael Boston, MD   3 mL at 07/23/20 1940  . lip balm (CARMEX) ointment 1 application  1 application Topical BID Michael Boston, MD   1 application at 68/03/21 0906  . magic mouthwash  15 mL Oral QID PRN Michael Boston, MD      . ondansetron [facility]) injection 4 mg  4 mg Intravenous Q6H PRN Michael Boston, MD      . oxyCODONE (Oxy IR/ROXICODONE) immediate release tablet 5-10 mg  5-10 mg Oral Q4H PRN Maczis, Barth Kirks, PA-C      . pantoprazole (PROTONIX) EC tablet 40 mg  40 mg Oral Q1200 Michael Boston, MD   40 mg at 07/23/20 1208  . sodium chloride flush (NS) 0.9 % injection 10 mL  10 mL Intracatheter Tor Netters, MD   10 mL at 07/24/20 0545  . thiamine tablet 100 mg  100 mg Oral Daily Michael Boston, MD   100 mg at 07/24/20 0901      Allergies  Allergen Reactions  . Nsaids     History of perforated ulcer    Signed: Morton Peters, MD, FACS, MASCRS Gastrointestinal and Minimally Invasive Surgery  [facility] [address] [place], [place] [address] [phone] Fax [phone] Main/Paging  CONTACT INFORMATION: Weekday (9AM-5PM) concerns: Call [facility] main office at [phone] Weeknight (5PM-9AM) or Weekend/Holiday concerns: Check [email or web address] for General Surgery [facility] coverage (Please, do not use SecureChat as it is not reliable communication to operating surgeons for immediate patient care)      07/24/2020, 10:47 AM

## 2020-07-24 NOTE — Plan of Care (Signed)
Pt was discharged home today. Instructions were reviewed with patient, and questions were answered. Pt was taken to main entrance via wheelchair by NT.  

## 2021-01-21 DIAGNOSIS — M1711 Unilateral primary osteoarthritis, right knee: Secondary | ICD-10-CM | POA: Diagnosis not present

## 2021-02-19 DIAGNOSIS — D126 Benign neoplasm of colon, unspecified: Secondary | ICD-10-CM | POA: Diagnosis not present

## 2021-02-19 DIAGNOSIS — R739 Hyperglycemia, unspecified: Secondary | ICD-10-CM | POA: Diagnosis not present

## 2021-02-19 DIAGNOSIS — Z125 Encounter for screening for malignant neoplasm of prostate: Secondary | ICD-10-CM | POA: Diagnosis not present

## 2021-02-19 DIAGNOSIS — Z720 Tobacco use: Secondary | ICD-10-CM | POA: Diagnosis not present

## 2021-02-19 DIAGNOSIS — E7800 Pure hypercholesterolemia, unspecified: Secondary | ICD-10-CM | POA: Diagnosis not present

## 2021-02-19 DIAGNOSIS — Z136 Encounter for screening for cardiovascular disorders: Secondary | ICD-10-CM | POA: Diagnosis not present

## 2021-02-19 DIAGNOSIS — Z0000 Encounter for general adult medical examination without abnormal findings: Secondary | ICD-10-CM | POA: Diagnosis not present

## 2021-02-19 DIAGNOSIS — I998 Other disorder of circulatory system: Secondary | ICD-10-CM | POA: Diagnosis not present

## 2021-02-24 ENCOUNTER — Other Ambulatory Visit: Payer: Self-pay | Admitting: Family Medicine

## 2021-02-24 DIAGNOSIS — I998 Other disorder of circulatory system: Secondary | ICD-10-CM

## 2021-03-12 ENCOUNTER — Other Ambulatory Visit: Payer: Self-pay

## 2021-03-12 ENCOUNTER — Ambulatory Visit
Admission: RE | Admit: 2021-03-12 | Discharge: 2021-03-12 | Disposition: A | Discharge status: Home / Self Care | Payer: Medicare HMO | Source: Ambulatory Visit | Attending: Family Medicine | Admitting: Family Medicine

## 2021-03-12 DIAGNOSIS — N289 Disorder of kidney and ureter, unspecified: Secondary | ICD-10-CM | POA: Diagnosis not present

## 2021-03-12 DIAGNOSIS — I998 Other disorder of circulatory system: Secondary | ICD-10-CM

## 2021-03-12 DIAGNOSIS — I714 Abdominal aortic aneurysm, without rupture: Secondary | ICD-10-CM | POA: Diagnosis not present

## 2021-03-12 DIAGNOSIS — K760 Fatty (change of) liver, not elsewhere classified: Secondary | ICD-10-CM | POA: Diagnosis not present

## 2021-03-12 DIAGNOSIS — E278 Other specified disorders of adrenal gland: Secondary | ICD-10-CM | POA: Diagnosis not present

## 2021-03-12 MED ORDER — IOPAMIDOL (ISOVUE-300) INJECTION 61%
100.0000 mL | Freq: Once | INTRAVENOUS | Status: AC | PRN
  Administered 2021-03-12: 100 mL via INTRAVENOUS

## 2021-03-13 DIAGNOSIS — H524 Presbyopia: Secondary | ICD-10-CM | POA: Diagnosis not present

## 2021-03-27 DIAGNOSIS — H2513 Age-related nuclear cataract, bilateral: Secondary | ICD-10-CM | POA: Diagnosis not present

## 2021-04-04 DIAGNOSIS — N2889 Other specified disorders of kidney and ureter: Secondary | ICD-10-CM | POA: Diagnosis not present

## 2021-04-04 DIAGNOSIS — K760 Fatty (change of) liver, not elsewhere classified: Secondary | ICD-10-CM | POA: Diagnosis not present

## 2021-04-07 NOTE — Progress Notes (Signed)
[facility] [place]  ASSESSMENT / PLAN: Timothy Shaffer is a 70 y.o. male with a infrarenal abdominal aortic aneurysm read as 90mm. I suspect it is a bit smaller than this based on the turn of the aorta in the abdomen, and appears only ectatic to my measurements.  A statement from the [employer] for Vascular [employer] and Society for Vascular Surgery estimated the annual rupture risk according to AAA diameter to be the following: Less than 4.0 cm in diameter - 0%  The patient is not a candidate for elective repair of the aneurysm to prevent rupture because of small size of aneurysm.  Recommend Complete cessation from all tobacco products. Blood glucose control with goal A1c < 7%. Blood pressure control with goal blood pressure < 140/90 mmHg. Lipid reduction therapy with goal LDL-C <100 mg/dL. Aspirin 81mg  PO QD.  Atorvastatin 40-80mg  PO QD (or other "high intensity" statin therapy).  Follow up with me in 5 years with AAA duplex and popliteal duplex.  CHIEF COMPLAINT: incidental discovery of aneurysm  HISTORY OF PRESENT ILLNESS: Timothy Shaffer is a 70 y.o. male who presents to clinic for evaluation of an incidentally discovered abdominal aortic aneurysm.  This was identified after plain film of the knee demonstrated arterial calcification.  He is a longtime smoker.  He reports no symptoms of claudication, ischemic rest pain, or ischemic ulceration.  He is quite active at his house and cares for many animals.  He has no symptoms attributable to his aorta.  Past Medical History:  Diagnosis Date  . Asthma    allergies  . Complication of anesthesia   . Family history of adverse reaction to anesthesia    PONV  . Perforated duodenal ulcer ([place]) 05/21/1992   Emergency surgery at [facility], [place], [place]  . PONV (postoperative nausea and vomiting)   . PUD (peptic ulcer disease)     Past Surgical History:  Procedure Laterality Date  .  COLONOSCOPY  04/2020  . MASS EXCISION Left 04/24/2014   Procedure: LEFT LONG & RING FINGERS DISTAL INTERPHALANGEAL JOINT/CYST EXCISION;  Surgeon: Cammie Sickle., MD;  Location: [facility];  Service: Orthopedics;  Laterality: Left;  . REPAIR OF PERFORATED ULCER  1993   [facility], [place], [place]    No family history on file.  Social History   Socioeconomic History  . Marital status: Married    Spouse name: Not on file  . Number of children: Not on file  . Years of education: Not on file  . Highest education level: Not on file  Occupational History  . Not on file  Tobacco Use  . Smoking status: Current Every Day Smoker    Packs/day: 1.00    Years: 15.00    Pack years: 15.00    Types: Cigarettes  . Smokeless tobacco: Never Used  Vaping Use  . Vaping Use: Never used  Substance and Sexual Activity  . Alcohol use: Yes    Alcohol/week: 12.0 standard drinks    Types: 12 Cans of beer per week  . Drug use: Yes    Types: Marijuana  . Sexual activity: Not on file  Other Topics Concern  . Not on file  Social History Narrative  . Not on file   Social Determinants of Health   Financial Resource Strain: Not on file  Food Insecurity: Not on file  Transportation Needs: Not on file  Physical Activity: Not on file  Stress: Not on file  Social  Connections: Not on file  Intimate Partner Violence: Not on file    Allergies  Allergen Reactions  . Nsaids     History of perforated ulcer    Current Outpatient Medications  Medication Sig Dispense Refill  . albuterol (VENTOLIN HFA) 108 (90 Base) MCG/ACT inhaler Inhale 1-2 puffs into the lungs every 6 (six) hours as needed for wheezing or shortness of breath.    . traMADol (ULTRAM) 50 MG tablet Take 1-2 tablets (50-100 mg total) by mouth every 6 (six) hours as needed for moderate pain or severe pain. 20 tablet 0   No current facility-administered medications for this visit.    REVIEW OF SYSTEMS:  [X]  denotes positive  finding, [ ]  denotes negative finding Cardiac  Comments:  Chest pain or chest pressure:    Shortness of breath upon exertion:    Short of breath when lying flat:    Irregular heart rhythm:        Vascular    Pain in calf, thigh, or hip brought on by ambulation:    Pain in feet at night that wakes you up from your sleep:     Blood clot in your veins:    Leg swelling:         Pulmonary    Oxygen at home:    Productive cough:     Wheezing:         Neurologic    Sudden weakness in arms or legs:     Sudden numbness in arms or legs:     Sudden onset of difficulty speaking or slurred speech:    Temporary loss of vision in one eye:     Problems with dizziness:         Gastrointestinal    Blood in stool:     Vomited blood:         Genitourinary    Burning when urinating:     Blood in urine:        Psychiatric    Major depression:         Hematologic    Bleeding problems:    Problems with blood clotting too easily:        Skin    Rashes or ulcers:        Constitutional    Fever or chills:      PHYSICAL EXAM  Vitals:   04/08/21 1050  BP: 126/82  Pulse: 94  Resp: 20  Temp: (!) 97.5 F (36.4 C)  SpO2: 97%  Weight: 124 lb 12.8 oz (56.6 kg)  Height: 5\' 11"  (1.803 m)    Constitutional: Well appearing.  No distress. Appears marginally nourished.  Neurologic: CN intact. no focal findings. no sensory loss. Psychiatric: Mood and affect symmetric and appropriate. Eyes: No icterus. No conjunctival pallor. Ears, nose, throat: mucous membranes moist. Midline trachea.  Cardiac: regular rate and rhythm.  Respiratory: unlabored. Abdominal: soft, non-tender, non-distended.  Peripheral vascular:  2+ popliteal pulses bilaterally  No popliteal  masses felt  No palpable pedal pulses  Feet are warm and well-perfused Extremity: No edema. No cyanosis. No pallor.  Skin: No gangrene. No ulceration.  Lymphatic: No Stemmer's sign. No palpable lymphadenopathy.  PERTINENT  LABORATORY AND RADIOLOGIC DATA  Most recent CBC CBC Latest Ref Rng & Units 07/24/2020 07/23/2020 07/22/2020  WBC 4.0 - 10.5 K/uL 14.5(H) 16.5(H) -  Hemoglobin 13.0 - 17.0 g/dL 11.0(L) 10.8(L) 10.6(L)  Hematocrit 39.0 - 52.0 % 33.0(L) 32.4(L) 31.0(L)  Platelets 150 - 400 K/uL 285 224 -  Most recent CMP CMP Latest Ref Rng & Units 07/24/2020 07/23/2020 07/22/2020  Glucose 70 - 99 mg/dL - - -  BUN 8 - 23 mg/dL - - -  Creatinine 0.61 - 1.24 mg/dL 0.77 0.64 0.77  Sodium 135 - 145 mmol/L - - -  Potassium 3.5 - 5.1 mmol/L 3.5 4.1 3.4(L)  Chloride 98 - 111 mmol/L - - -  CO2 22 - 32 mmol/L - - -  Calcium 8.9 - 10.3 mg/dL - - -  Total Protein 6.5 - 8.1 g/dL - - -  Total Bilirubin 0.3 - 1.2 mg/dL - - -  Alkaline Phos 38 - 126 U/L - - -  AST 15 - 41 U/L - - -  ALT 0 - 44 U/L - - -    Renal function CrCl cannot be calculated (Patient's most recent lab result is older than the maximum 21 days allowed.).  Hgb A1c MFr Bld (%)  Date Value  07/08/2020 5.0    No results found for: LDLCALC, LDLC, HIRISKLDL, POCLDL, LDLDIRECT, REALLDLC, TOTLDLC   Vascular Imaging: CLINICAL DATA:  Vascular calcification. History of retroperitoneal hematoma.  EXAM: CT ABDOMEN WITH CONTRAST  TECHNIQUE: Multidetector CT imaging of the abdomen was performed using the standard protocol following bolus administration of intravenous contrast.  CONTRAST:  137mL ISOVUE-300 IOPAMIDOL (ISOVUE-300) INJECTION 61%  COMPARISON:  07/20/2020  FINDINGS: Lower chest: Unremarkable.  Hepatobiliary: The liver shows diffusely decreased attenuation suggesting fat deposition. Areas of more pronounced geographic fatty deposition noted in segment III. Gallbladder is nondistended No intrahepatic or extrahepatic biliary dilation.  Pancreas: No focal mass lesion. No dilatation of the main duct. No intraparenchymal cyst. No peripancreatic edema.  Spleen: No splenomegaly. No focal mass lesion.  Adrenals/Urinary  Tract: Left adrenal thickening without nodule or mass. Right adrenal gland unremarkable. Right kidney unremarkable. 3.1 x 1.9 cm heterogeneous exophytic lesion identified interpolar left kidney with apparent enhancement (coronal image 112/series 8).  Stomach/Bowel: Stomach is unremarkable. No gastric wall thickening. No evidence of outlet obstruction. Duodenum is normally positioned as is the ligament of Treitz. No small bowel or colonic dilatation within the visualized abdomen. Evidence of prior right hemicolectomy.  Vascular/Lymphatic: There is abdominal aortic atherosclerosis. Infrarenal abdominal aorta measures up to 3.3 cm diameter. There is no gastrohepatic or hepatoduodenal ligament lymphadenopathy. No retroperitoneal or mesenteric lymphadenopathy.  Other: No intraperitoneal free fluid.  Musculoskeletal: No worrisome lytic or sclerotic osseous abnormality.  IMPRESSION: 1. 3.1 cm heterogeneous exophytic lesion interpolar left kidney with apparent enhancement. This was present previously but less well demonstrated given noncontrast technique used on the prior CT. MRI with and without contrast recommended to further evaluate. 2. Hepatic steatosis. 3. 3.3 cm infrarenal abdominal aortic aneurysm. 4. Aortic Atherosclerosis (ICD10-I70.0).   Electronically Signed   By: Misty Stanley M.D.   On: 03/13/2021 13:11  Yevonne Aline. Stanford Breed, MD [facility] Phone Number: [phone] 04/07/2021 12:09 PM

## 2021-04-08 ENCOUNTER — Other Ambulatory Visit: Payer: Self-pay

## 2021-04-08 ENCOUNTER — Ambulatory Visit: Payer: Medicare HMO | Admitting: Vascular Surgery

## 2021-04-08 ENCOUNTER — Encounter: Payer: Self-pay | Admitting: Vascular Surgery

## 2021-04-08 VITALS — BP 126/82 | HR 94 | Temp 97.5°F | Resp 20 | Ht 71.0 in | Wt 124.8 lb

## 2021-04-08 DIAGNOSIS — I77819 Aortic ectasia, unspecified site: Secondary | ICD-10-CM | POA: Diagnosis not present

## 2021-04-08 DIAGNOSIS — I739 Peripheral vascular disease, unspecified: Secondary | ICD-10-CM | POA: Diagnosis not present

## 2021-04-10 DIAGNOSIS — H2511 Age-related nuclear cataract, right eye: Secondary | ICD-10-CM | POA: Diagnosis not present

## 2021-04-29 ENCOUNTER — Ambulatory Visit: Payer: Medicare HMO | Admitting: Urology

## 2021-04-29 ENCOUNTER — Ambulatory Visit
Admission: RE | Admit: 2021-04-29 | Discharge: 2021-04-29 | Disposition: A | Discharge status: Home / Self Care | Payer: Medicare HMO | Attending: Urology | Admitting: Urology

## 2021-04-29 ENCOUNTER — Encounter: Payer: Self-pay | Admitting: Urology

## 2021-04-29 ENCOUNTER — Other Ambulatory Visit: Payer: Self-pay

## 2021-04-29 VITALS — BP 137/89 | HR 112 | Ht 71.0 in | Wt 125.0 lb

## 2021-04-29 DIAGNOSIS — R9389 Abnormal findings on diagnostic imaging of other specified body structures: Secondary | ICD-10-CM | POA: Insufficient documentation

## 2021-04-29 DIAGNOSIS — N2889 Other specified disorders of kidney and ureter: Secondary | ICD-10-CM | POA: Diagnosis not present

## 2021-04-29 LAB — URINALYSIS, COMPLETE (UACMP) WITH MICROSCOPIC
Glucose, UA: NEGATIVE mg/dL
Hgb urine dipstick: NEGATIVE
Leukocytes,Ua: NEGATIVE
Nitrite: NEGATIVE
Protein, ur: 30 mg/dL — AB
Specific Gravity, Urine: >1.03 — ABNORMAL HIGH (ref 1.005–1.030)
pH: 5.5 (ref 5.0–8.0)

## 2021-04-29 NOTE — Progress Notes (Signed)
04/29/21 1:03 PM   Timothy Shaffer 1951/09/29 [id number]  CC: Left renal mass  HPI: I saw Timothy Shaffer today in urology clinic for an enhancing left 3 cm renal mass.  He is a 70 year old male who was recently found to have an enhancing 3 cm left irregular midpole renal mass on CT in April 2022, and follow-up MRI performed at [facility] in May 2022 was most suggestive of RCC.  He has a complex history and that he previously had a perforated duodenal ulcer and underwent open exploratory laparotomy and repair in the 90s.  Most recently he underwent a robotic proximal hemicolectomy with lysis of adhesions in August 2021 for an unresectable polyp found on colonoscopy.  Pathology was ultimately benign.  The surgery was complicated by tension pneumothorax requiring chest tube and intubation and prolonged ICU stay.  He continues to smoke 1/2 pack a day which is down from 2 packs a day previously.  He denies any gross hematuria or urinary symptoms.  He has had about 15 pounds of weight loss since his last surgery that he has not yet been able to regain.  Urinalysis today is benign with 0-5 squamous cells, 0-5 WBCs, 0-5 RBCs, few bacteria.  Renal function is normal with creatinine of 0.9, EGFR greater than 60.   PMH: Past Medical History:  Diagnosis Date  . AAA (abdominal aortic aneurysm) ([place])   . Asthma    allergies  . Complication of anesthesia   . Family history of adverse reaction to anesthesia    PONV  . Perforated duodenal ulcer ([place]) 05/21/1992   Emergency surgery at [facility], [place], [place]  . PONV (postoperative nausea and vomiting)   . PUD (peptic ulcer disease)     Surgical History: Past Surgical History:  Procedure Laterality Date  . COLONOSCOPY  04/2020  . MASS EXCISION Left 04/24/2014   Procedure: LEFT LONG & RING FINGERS DISTAL INTERPHALANGEAL JOINT/CYST EXCISION;  Surgeon: Cammie Sickle., MD;  Location: [facility];  Service: Orthopedics;  Laterality: Left;  .  REPAIR OF PERFORATED ULCER  1993   [facility], [place], [place]    Social History:  reports that he has been smoking cigarettes. He has a 7.50 pack-year smoking history. He has never used smokeless tobacco. He reports current alcohol use of about 12.0 standard drinks of alcohol per week. He reports current drug use. Drug: Marijuana.  Physical Exam: BP 137/89 (BP Location: Left Arm, Patient Position: Sitting, Cuff Size: Normal)   Pulse (!) 112   Ht _0  (1.803 m)   Wt 125 lb (56.7 kg)   BMI 17.43 kg/m    Constitutional:  Alert and oriented, No acute distress. Cardiovascular: No clubbing, cyanosis, or edema. Respiratory: Normal respiratory effort, no increased work of breathing. GI: Abdomen is soft, nontender, nondistended, no abdominal masses  Laboratory Data: Reviewed, see HPI  Pertinent Imaging: I have personally viewed and interpreted the CT abdomen with contrast dated 03/13/2021 showing an enhancing 3 cm irregular shaped left midpole renal mass that contains both an endophytic and exophytic component.  I am unable to review the MRI from [facility], but report suspicious for RCC of the left kidney with enhancement of this previously mentioned mass.  Assessment & Plan:   71 year old male with an enhancing left 3 cm renal mass concerning for RCC.  His history is complicated by open exploratory laparotomy for perforated duodenal ulcer in the 90s, followed by recent robotic hemicolectomy for non-cancerous polyp in August 2585 that was complicated by  tension pneumothorax and required intubation and ICU stay with prolonged hospitalization.  We reviewed his images today.  Unfortunately the MRI images are unavailable for me to personally review, just the report in care everywhere.  We reviewed the AUA guidelines regarding small renal masses, and the likelihood of malignancy likely greater than 80 to 90%, most commonly RCC.  His tumor is irregularly shaped, which can correspond with more aggressive tumors.  We  discussed options at length including active surveillance, renal mass biopsy, percutaneous ablation, robotic partial nephrectomy, and radical nephrectomy.  Risks and benefits discussed extensively.  He is understandably very hesitant to undergo any further surgery after his complex hospitalization in August 7482 that was complicated by tension pneumothorax and required intubation and ICU stay.  He feels he is still recovering from that surgery.  I requested that he bring the MRI disc and I can personally review this and compare this to the prior CT from April 2022 and the non-contrast CT from August 2021 to evaluate for change in size of the lesion.  If the mass is relatively stable, I think considering renal mass biopsy and even potentially ablation would be very reasonable.  I suspect he will have a fair amount of adhesions from his recent surgery in addition to the prior exploratory laparotomy which would make partial nephrectomy very challenging.  He will bring the MRI to [facility] office, I will review and call him with findings.  Consider renal mass biopsy and percutaneous ablation  I spent 65 total minutes on the day of the encounter including pre-visit review of the medical record, face-to-face time with the patient, and post visit ordering of labs/imaging/tests.  Nickolas Madrid, MD 04/29/2021  [facility] [address] [place], [place] [address] [phone]

## 2021-04-29 NOTE — Patient Instructions (Signed)
Renal Mass  A renal mass is an abnormal growth in the kidney. It may be found while performing an MRI, CT scan, or ultrasound to evaluate other problems of the abdomen. A renal mass that is cancerous (malignant) may grow or spread quickly. Others are not cancerous (benign). Renal masses include:  Tumors. These may be malignant or benign. ? The most common type of kidney cancer in adults is renal cell carcinoma. In children, the most common type of kidney cancer is Wilms tumor. ? The most common benign tumors of the kidney include renal adenomas, oncocytomas, and angiomyolipoma (AML).  Cysts. These are fluid-filled sacs that form on or in the kidney. What are the causes? Certain types of cancers, infections, or injuries can cause a renal mass. It is not always known what causes a cyst to develop in or on the kidney. What are the signs or symptoms? Often, a renal mass does not cause any signs or symptoms; most kidney cysts do not cause symptoms. How is this diagnosed? Your health care provider may recommend tests to diagnose the cause of your renal mass. These tests may be done if a renal mass is found:  Physical exam.  Blood tests.  Urine tests.  Imaging tests, such as ultrasound, CT scan, or MRI.  Biopsy. This is a small sample that is removed from the renal mass and tested in a lab. The exact tests and how often they are done will depend on:  The size and appearance of the renal mass.  Risk factors or medical conditions that increase your risk for problems.  Any symptoms associated with the renal mass, or concerns that you have about it. Tests and physical exams may be done once, or they may be done regularly for a period of time. Tests and exams that are done regularly will help monitor whether the mass is growing and beginning to cause problems. How is this treated? Treatment is not always needed for this condition. Your health care provider may recommend careful monitoring and  regular tests and exams. Treatment will depend on the cause of the mass. Treatment for a cancerous renal mass may include surgical removal, chemotherapy, radiation, or immunotherapy. Most kidney cysts do not need to be treated. Follow these instructions at home: What you need to do at home will depend on the cause of the mass. Follow the instructions that your health care provider gives to you. In general:  Take over-the-counter and prescription medicines only as told by your health care provider.  If you were prescribed an antibiotic medicine, take it as told by your health care provider. Do not stop taking the antibiotic even if you start to feel better.  Follow any restrictions that are given to you by your health care provider.  Keep all follow-up visits. This is important. ? You may need to see your health care provider once or twice a year to have CT scans and ultrasounds. These tests will show if your renal mass has changed or grown. Contact a health care provider if you:  Have pain in your side or back (flank pain).  Have a fever.  Feel full soon after eating.  Have pain or swelling in the abdomen.  Lose weight. Get help right away if:  Your pain gets worse.  There is blood in your urine.  You cannot urinate.  You have chest pain.  You have trouble breathing. These symptoms may represent a serious problem that is an emergency. Do not wait to  see if the symptoms will go away. Get medical help right away. Call your local emergency services (911 in the [place] Summary  A renal mass is an abnormal growth in the kidney. It may be cancerous (malignant) and grow or spread quickly, or it may not be cancerous (benign). Renal masses often do not have any signs or symptoms.  Renal masses may be found while performing an MRI, CT scan, or ultrasound for other problems of the abdomen.  Your health care provider may recommend that you have tests to diagnose the cause of your renal  mass. These may include a physical exam, blood tests, urine tests, imaging, or a biopsy.  Treatment is not always needed for this condition. Careful monitoring may be recommended. This information is not intended to replace advice given to you by your health care provider. Make sure you discuss any questions you have with your health care provider. Document Revised: 05/20/2020 Document Reviewed: 05/20/2020 Elsevier Patient Education  2021 [employer].   Kidney Cancer  Kidney cancer is an abnormal growth of cells in one or both kidneys. The kidneys filter waste from your blood and produce urine. Kidney cancer may spread to other parts of your body. This type of cancer may also be called renal cell carcinoma. What are the causes? The cause of this condition is not always known. In some cases, abnormal changes to genes (genetic mutations) can cause cells to form cancer. What increases the risk? You may be more likely to develop kidney cancer if you:  Are over age 62. The risk increases with age.  Have a family history of kidney cancer.  Are of African-American, Native American, or Native [place] descent.  Smoke.  Are male.  Are obese.  Have high blood pressure (hypertension).  Have advanced kidney disease, especially if you need long-term dialysis.  Have certain conditions that are passed from parent to child (inherited), such as von Hippel-Lindau disease, tuberous sclerosis, or hereditary papillary renal carcinoma.  Have been exposed to certain chemicals. What are the signs or symptoms? In the early stages, kidney cancer does not cause symptoms. As the cancer grows, symptoms may include:  Blood in the urine.  Pain in the upper back or abdomen, just below the rib cage. You may feel pain on one or both sides of the body.  Fatigue.  Unexplained weight loss.  Fever. How is this diagnosed? This condition may be diagnosed based on:  Your symptoms and medical history.  A  physical exam.  Blood and urine tests.  X-rays.  Imaging tests, such as CT scans, MRIs, and PET scans.  Having dye injected into your blood through an IV, and then having X-rays taken of: ? Your kidneys and the rest of the organs involved in making and storing urine (intravenous pyelogram). ? Your blood vessels (angiogram).  Removal and testing of a kidney tissue sample (biopsy). Your cancer will be assessed (staged), based on how severe it is and how much it has spread. How is this treated? Treatment depends on the type and stage of the cancer. Treatment may include one or more of the following:  Surgery. This may include surgery to remove: ? Just the tumor (nephron-sparing surgery). ? The entire kidney (nephrectomy). ? The kidney, some of the surrounding healthy tissue, nearby lymph nodes, and the adrenal gland in certain cases (radical nephrectomy).  Medicines that kill cancer cells (chemotherapy).  High-energy rays that kill cancer cells (radiation therapy).  Targeted therapy. This targets specific parts of  cancer cells and the area around them to block the growth and the spread of the cancer. Targeted therapy can help to limit the damage to healthy cells.  Medicines that help your body's disease-fighting system (immune system) fight cancer cells (immunotherapy).  Freezing cancer cells using gas or liquid that is delivered through a needle (cryoablation).  Destroying cancer cells using high-energy radio waves that are delivered through a needle-like probe (radiofrequency ablation).  A procedure to block the artery that supplies blood to the tumor, which kills the cancer cells (embolization). Follow these instructions at home: Eating and drinking  Some of your treatments might affect your appetite and your ability to chew and swallow. If you are having problems eating, or if you do not have an appetite, meet with a diet and nutrition specialist (dietitian).  If you have  side effects that affect eating, it may help to: ? Eat smaller meals and snacks often. ? Drink high-nutrition and high-calorie shakes or supplements. ? Eat bland and soft foods that are easy to eat. ? Not eat foods that are hot, spicy, or hard to swallow. Lifestyle  Do not drink alcohol.  Do not use any products that contain nicotine or tobacco, such as cigarettes and e-cigarettes. If you need help quitting, ask your health care provider. General instructions  Take over-the-counter and prescription medicines only as told by your health care provider. This includes vitamins, supplements, and herbal products.  Consider joining a support group to help you cope with the stress of having kidney cancer.  Work with your health care provider to manage any side effects of treatment.  Keep all follow-up visits as told by your health care provider. This is important.   Where to find more information  American Cancer Society: https://www.cancer.[place] ([place]): https://www.cancer.gov Contact a health care provider if you:  Notice that you bruise or bleed easily.  Are losing weight without trying.  Have new or increased fatigue or weakness. Get help right away if you have:  Blood in your urine.  A sudden increase in pain.  A fever.  Shortness of breath.  Chest pain.  Yellow skin or whites of your eyes (jaundice). Summary  Kidney cancer is an abnormal growth of cells (tumor) in one or both kidneys. Tumors may spread to other parts of your body.  In the early stages, kidney cancer does not cause symptoms. As the cancer grows, symptoms may include blood in the urine, pain in the upper back or abdomen, unexplained weight loss, fatigue, and fever.  Treatment depends on the type and stage of the cancer. It may include surgery to remove the tumor, procedures and medicines to kill the cancer cells, or medicines to help your body fight cancer cells. This information  is not intended to replace advice given to you by your health care provider. Make sure you discuss any questions you have with your health care provider. Document Revised: 12/15/2017 Document Reviewed: 12/12/2017 Elsevier Patient Education  2021 [employer].

## 2021-05-20 ENCOUNTER — Other Ambulatory Visit: Payer: Self-pay | Admitting: Urology

## 2021-05-20 DIAGNOSIS — N2889 Other specified disorders of kidney and ureter: Secondary | ICD-10-CM

## 2021-05-20 NOTE — Telephone Encounter (Signed)
Hi Dr. Diamantina Providence,   Patient called the office today requesting an update regarding his MRI scans that he dropped off for your review.   Have you had a chance to talk to Dr. Erlene Quan?    Please advise.

## 2021-05-20 NOTE — Progress Notes (Signed)
Urology telephone note:  3 cm left enhancing renal mass, recent abdominal surgery for bowel resections requiring extensive lysis of adhesions as well as pneumothorax requiring intubation and ICU stay.  We again reviewed the risks and benefits of biopsy, percutaneous ablation with interventional radiology, and robotic partial nephrectomy versus possible radical nephrectomy.  With his extensive complications from recent robotic bowel surgery, he elects to pursue biopsy and percutaneous ablation which I think is very reasonable.   Referral placed to interventional radiology for consideration of left renal mass biopsy and percutaneous ablation  Nickolas Madrid, MD 05/20/2021

## 2021-05-21 ENCOUNTER — Other Ambulatory Visit: Payer: Self-pay | Admitting: Urology

## 2021-05-21 ENCOUNTER — Telehealth: Payer: Self-pay

## 2021-05-21 DIAGNOSIS — N2889 Other specified disorders of kidney and ureter: Secondary | ICD-10-CM

## 2021-05-21 NOTE — Telephone Encounter (Signed)
Message sent to scheduling awaiting response

## 2021-05-21 NOTE — Telephone Encounter (Signed)
-----   Message from Billey Co, MD sent at 05/20/2021  3:48 PM EDT ----- Regarding: IR referral FYI I placed a referral to interventional radiology for biopsy/percutaneous ablation of a left renal mass, thanks  Nickolas Madrid, MD 05/20/2021

## 2021-05-23 ENCOUNTER — Emergency Department: Payer: Medicare HMO

## 2021-05-23 ENCOUNTER — Other Ambulatory Visit: Payer: Self-pay

## 2021-05-23 ENCOUNTER — Inpatient Hospital Stay
Admission: EM | Admit: 2021-05-23 | Discharge: 2021-06-06 | DRG: 326 | Disposition: E | Payer: Medicare HMO | Attending: Internal Medicine | Admitting: Internal Medicine

## 2021-05-23 ENCOUNTER — Inpatient Hospital Stay: Payer: Medicare HMO

## 2021-05-23 DIAGNOSIS — F17210 Nicotine dependence, cigarettes, uncomplicated: Secondary | ICD-10-CM | POA: Diagnosis present

## 2021-05-23 DIAGNOSIS — F1010 Alcohol abuse, uncomplicated: Secondary | ICD-10-CM | POA: Diagnosis present

## 2021-05-23 DIAGNOSIS — Z789 Other specified health status: Secondary | ICD-10-CM | POA: Diagnosis not present

## 2021-05-23 DIAGNOSIS — J4540 Moderate persistent asthma, uncomplicated: Secondary | ICD-10-CM | POA: Diagnosis not present

## 2021-05-23 DIAGNOSIS — I959 Hypotension, unspecified: Secondary | ICD-10-CM | POA: Diagnosis not present

## 2021-05-23 DIAGNOSIS — N400 Enlarged prostate without lower urinary tract symptoms: Secondary | ICD-10-CM | POA: Diagnosis not present

## 2021-05-23 DIAGNOSIS — K3189 Other diseases of stomach and duodenum: Secondary | ICD-10-CM | POA: Diagnosis present

## 2021-05-23 DIAGNOSIS — T39395A Adverse effect of other nonsteroidal anti-inflammatory drugs [NSAID], initial encounter: Secondary | ICD-10-CM | POA: Diagnosis present

## 2021-05-23 DIAGNOSIS — D500 Iron deficiency anemia secondary to blood loss (chronic): Secondary | ICD-10-CM | POA: Diagnosis present

## 2021-05-23 DIAGNOSIS — D62 Acute posthemorrhagic anemia: Secondary | ICD-10-CM | POA: Diagnosis present

## 2021-05-23 DIAGNOSIS — I714 Abdominal aortic aneurysm, without rupture: Secondary | ICD-10-CM | POA: Diagnosis present

## 2021-05-23 DIAGNOSIS — Z452 Encounter for adjustment and management of vascular access device: Secondary | ICD-10-CM

## 2021-05-23 DIAGNOSIS — Z66 Do not resuscitate: Secondary | ICD-10-CM | POA: Diagnosis not present

## 2021-05-23 DIAGNOSIS — E876 Hypokalemia: Secondary | ICD-10-CM | POA: Diagnosis present

## 2021-05-23 DIAGNOSIS — Z720 Tobacco use: Secondary | ICD-10-CM | POA: Diagnosis not present

## 2021-05-23 DIAGNOSIS — D492 Neoplasm of unspecified behavior of bone, soft tissue, and skin: Secondary | ICD-10-CM | POA: Diagnosis not present

## 2021-05-23 DIAGNOSIS — K660 Peritoneal adhesions (postprocedural) (postinfection): Secondary | ICD-10-CM | POA: Diagnosis present

## 2021-05-23 DIAGNOSIS — Z515 Encounter for palliative care: Secondary | ICD-10-CM

## 2021-05-23 DIAGNOSIS — K265 Chronic or unspecified duodenal ulcer with perforation: Secondary | ICD-10-CM

## 2021-05-23 DIAGNOSIS — D72829 Elevated white blood cell count, unspecified: Secondary | ICD-10-CM | POA: Diagnosis not present

## 2021-05-23 DIAGNOSIS — K279 Peptic ulcer, site unspecified, unspecified as acute or chronic, without hemorrhage or perforation: Secondary | ICD-10-CM | POA: Diagnosis not present

## 2021-05-23 DIAGNOSIS — F419 Anxiety disorder, unspecified: Secondary | ICD-10-CM | POA: Diagnosis present

## 2021-05-23 DIAGNOSIS — Z681 Body mass index (BMI) 19 or less, adult: Secondary | ICD-10-CM | POA: Diagnosis not present

## 2021-05-23 DIAGNOSIS — S2231XA Fracture of one rib, right side, initial encounter for closed fracture: Secondary | ICD-10-CM | POA: Diagnosis not present

## 2021-05-23 DIAGNOSIS — Z9119 Patient's noncompliance with other medical treatment and regimen: Secondary | ICD-10-CM

## 2021-05-23 DIAGNOSIS — J4520 Mild intermittent asthma, uncomplicated: Secondary | ICD-10-CM | POA: Diagnosis not present

## 2021-05-23 DIAGNOSIS — M7918 Myalgia, other site: Secondary | ICD-10-CM | POA: Diagnosis present

## 2021-05-23 DIAGNOSIS — E871 Hypo-osmolality and hyponatremia: Secondary | ICD-10-CM | POA: Diagnosis present

## 2021-05-23 DIAGNOSIS — Z01818 Encounter for other preprocedural examination: Secondary | ICD-10-CM

## 2021-05-23 DIAGNOSIS — R000 Tachycardia, unspecified: Secondary | ICD-10-CM | POA: Diagnosis not present

## 2021-05-23 DIAGNOSIS — K6389 Other specified diseases of intestine: Secondary | ICD-10-CM | POA: Diagnosis not present

## 2021-05-23 DIAGNOSIS — R197 Diarrhea, unspecified: Secondary | ICD-10-CM | POA: Diagnosis present

## 2021-05-23 DIAGNOSIS — J95821 Acute postprocedural respiratory failure: Secondary | ICD-10-CM | POA: Diagnosis not present

## 2021-05-23 DIAGNOSIS — J45909 Unspecified asthma, uncomplicated: Secondary | ICD-10-CM | POA: Diagnosis not present

## 2021-05-23 DIAGNOSIS — K260 Acute duodenal ulcer with hemorrhage: Principal | ICD-10-CM | POA: Diagnosis present

## 2021-05-23 DIAGNOSIS — R578 Other shock: Secondary | ICD-10-CM | POA: Diagnosis not present

## 2021-05-23 DIAGNOSIS — Z86010 Personal history of colonic polyps: Secondary | ICD-10-CM

## 2021-05-23 DIAGNOSIS — Z8379 Family history of other diseases of the digestive system: Secondary | ICD-10-CM

## 2021-05-23 DIAGNOSIS — K219 Gastro-esophageal reflux disease without esophagitis: Secondary | ICD-10-CM | POA: Diagnosis present

## 2021-05-23 DIAGNOSIS — E43 Unspecified severe protein-calorie malnutrition: Secondary | ICD-10-CM | POA: Diagnosis not present

## 2021-05-23 DIAGNOSIS — K922 Gastrointestinal hemorrhage, unspecified: Secondary | ICD-10-CM

## 2021-05-23 DIAGNOSIS — K269 Duodenal ulcer, unspecified as acute or chronic, without hemorrhage or perforation: Secondary | ICD-10-CM | POA: Diagnosis not present

## 2021-05-23 DIAGNOSIS — Z791 Long term (current) use of non-steroidal anti-inflammatories (NSAID): Secondary | ICD-10-CM

## 2021-05-23 DIAGNOSIS — Z0389 Encounter for observation for other suspected diseases and conditions ruled out: Secondary | ICD-10-CM | POA: Diagnosis not present

## 2021-05-23 DIAGNOSIS — K551 Chronic vascular disorders of intestine: Secondary | ICD-10-CM | POA: Diagnosis not present

## 2021-05-23 DIAGNOSIS — I701 Atherosclerosis of renal artery: Secondary | ICD-10-CM | POA: Diagnosis not present

## 2021-05-23 DIAGNOSIS — K449 Diaphragmatic hernia without obstruction or gangrene: Secondary | ICD-10-CM | POA: Diagnosis present

## 2021-05-23 DIAGNOSIS — E870 Hyperosmolality and hypernatremia: Secondary | ICD-10-CM | POA: Diagnosis not present

## 2021-05-23 DIAGNOSIS — A419 Sepsis, unspecified organism: Secondary | ICD-10-CM | POA: Diagnosis not present

## 2021-05-23 DIAGNOSIS — Z20822 Contact with and (suspected) exposure to covid-19: Secondary | ICD-10-CM | POA: Diagnosis present

## 2021-05-23 DIAGNOSIS — Z8711 Personal history of peptic ulcer disease: Secondary | ICD-10-CM

## 2021-05-23 DIAGNOSIS — K631 Perforation of intestine (nontraumatic): Secondary | ICD-10-CM

## 2021-05-23 DIAGNOSIS — K635 Polyp of colon: Secondary | ICD-10-CM | POA: Diagnosis present

## 2021-05-23 DIAGNOSIS — R944 Abnormal results of kidney function studies: Secondary | ICD-10-CM | POA: Diagnosis present

## 2021-05-23 DIAGNOSIS — N2889 Other specified disorders of kidney and ureter: Secondary | ICD-10-CM | POA: Diagnosis present

## 2021-05-23 DIAGNOSIS — J9811 Atelectasis: Secondary | ICD-10-CM | POA: Diagnosis not present

## 2021-05-23 DIAGNOSIS — Z9049 Acquired absence of other specified parts of digestive tract: Secondary | ICD-10-CM

## 2021-05-23 DIAGNOSIS — R54 Age-related physical debility: Secondary | ICD-10-CM | POA: Diagnosis present

## 2021-05-23 DIAGNOSIS — K254 Chronic or unspecified gastric ulcer with hemorrhage: Secondary | ICD-10-CM | POA: Diagnosis not present

## 2021-05-23 DIAGNOSIS — R4589 Other symptoms and signs involving emotional state: Secondary | ICD-10-CM | POA: Diagnosis not present

## 2021-05-23 DIAGNOSIS — K921 Melena: Secondary | ICD-10-CM | POA: Diagnosis not present

## 2021-05-23 DIAGNOSIS — R42 Dizziness and giddiness: Secondary | ICD-10-CM | POA: Diagnosis not present

## 2021-05-23 DIAGNOSIS — D49512 Neoplasm of unspecified behavior of left kidney: Secondary | ICD-10-CM | POA: Diagnosis not present

## 2021-05-23 DIAGNOSIS — K262 Acute duodenal ulcer with both hemorrhage and perforation: Secondary | ICD-10-CM | POA: Diagnosis not present

## 2021-05-23 LAB — CBC WITH DIFFERENTIAL/PLATELET
Abs Immature Granulocytes: 0.35 10*3/uL — ABNORMAL HIGH (ref 0.00–0.07)
Basophils Absolute: 0.1 10*3/uL (ref 0.0–0.1)
Basophils Relative: 0 %
Eosinophils Absolute: 0 10*3/uL (ref 0.0–0.5)
Eosinophils Relative: 0 %
HCT: 31.8 % — ABNORMAL LOW (ref 39.0–52.0)
Hemoglobin: 11.1 g/dL — ABNORMAL LOW (ref 13.0–17.0)
Immature Granulocytes: 2 %
Lymphocytes Relative: 5 %
Lymphs Abs: 0.8 10*3/uL (ref 0.7–4.0)
MCH: 37.2 pg — ABNORMAL HIGH (ref 26.0–34.0)
MCHC: 34.9 g/dL (ref 30.0–36.0)
MCV: 106.7 fL — ABNORMAL HIGH (ref 80.0–100.0)
Monocytes Absolute: 1.2 10*3/uL — ABNORMAL HIGH (ref 0.1–1.0)
Monocytes Relative: 8 %
Neutro Abs: 12.2 10*3/uL — ABNORMAL HIGH (ref 1.7–7.7)
Neutrophils Relative %: 85 %
Platelets: 184 10*3/uL (ref 150–400)
RBC: 2.98 MIL/uL — ABNORMAL LOW (ref 4.22–5.81)
RDW: 13.1 % (ref 11.5–15.5)
WBC: 14.5 10*3/uL — ABNORMAL HIGH (ref 4.0–10.5)
nRBC: 0 % (ref 0.0–0.2)

## 2021-05-23 LAB — CBC
HCT: 31.3 % — ABNORMAL LOW (ref 39.0–52.0)
HCT: 29.9 % — ABNORMAL LOW (ref 39.0–52.0)
Hemoglobin: 10.9 g/dL — ABNORMAL LOW (ref 13.0–17.0)
Hemoglobin: 10.3 g/dL — ABNORMAL LOW (ref 13.0–17.0)
MCH: 37.8 pg — ABNORMAL HIGH (ref 26.0–34.0)
MCH: 37.1 pg — ABNORMAL HIGH (ref 26.0–34.0)
MCHC: 34.8 g/dL (ref 30.0–36.0)
MCHC: 34.4 g/dL (ref 30.0–36.0)
MCV: 108.7 fL — ABNORMAL HIGH (ref 80.0–100.0)
MCV: 107.6 fL — ABNORMAL HIGH (ref 80.0–100.0)
Platelets: 174 10*3/uL (ref 150–400)
Platelets: 176 10*3/uL (ref 150–400)
RBC: 2.88 MIL/uL — ABNORMAL LOW (ref 4.22–5.81)
RBC: 2.78 MIL/uL — ABNORMAL LOW (ref 4.22–5.81)
RDW: 13.2 % (ref 11.5–15.5)
RDW: 13.2 % (ref 11.5–15.5)
WBC: 12.9 10*3/uL — ABNORMAL HIGH (ref 4.0–10.5)
WBC: 12.4 10*3/uL — ABNORMAL HIGH (ref 4.0–10.5)
nRBC: 0 % (ref 0.0–0.2)
nRBC: 0 % (ref 0.0–0.2)

## 2021-05-23 LAB — COMPREHENSIVE METABOLIC PANEL
ALT: 24 U/L (ref 0–44)
AST: 32 U/L (ref 15–41)
Albumin: 3.2 g/dL — ABNORMAL LOW (ref 3.5–5.0)
Alkaline Phosphatase: 59 U/L (ref 38–126)
Anion gap: 7 (ref 5–15)
BUN: 28 mg/dL — ABNORMAL HIGH (ref 8–23)
CO2: 24 mmol/L (ref 22–32)
Calcium: 8.2 mg/dL — ABNORMAL LOW (ref 8.9–10.3)
Chloride: 100 mmol/L (ref 98–111)
Creatinine, Ser: 0.88 mg/dL (ref 0.61–1.24)
GFR, Estimated: >60 mL/min (ref >60)
Glucose, Bld: 174 mg/dL — ABNORMAL HIGH (ref 70–99)
Potassium: 3.1 mmol/L — ABNORMAL LOW (ref 3.5–5.1)
Sodium: 131 mmol/L — ABNORMAL LOW (ref 135–145)
Total Bilirubin: 1.3 mg/dL — ABNORMAL HIGH (ref 0.3–1.2)
Total Protein: 5.2 g/dL — ABNORMAL LOW (ref 6.5–8.1)

## 2021-05-23 LAB — PROCALCITONIN: Procalcitonin: 0.96 ng/mL

## 2021-05-23 LAB — PROTIME-INR
INR: 1 (ref 0.8–1.2)
Prothrombin Time: 13 seconds (ref 11.4–15.2)

## 2021-05-23 LAB — RESP PANEL BY RT-PCR (FLU A&B, COVID) ARPGX2
Influenza A by PCR: NEGATIVE
Influenza B by PCR: NEGATIVE
SARS Coronavirus 2 by RT PCR: NEGATIVE

## 2021-05-23 LAB — LACTIC ACID, PLASMA
Lactic Acid, Venous: 1.6 mmol/L (ref 0.5–1.9)
Lactic Acid, Venous: 1.2 mmol/L (ref 0.5–1.9)

## 2021-05-23 LAB — MAGNESIUM: Magnesium: 1.4 mg/dL — ABNORMAL LOW (ref 1.7–2.4)

## 2021-05-23 LAB — PHOSPHORUS: Phosphorus: 3.2 mg/dL (ref 2.5–4.6)

## 2021-05-23 MED ORDER — IOHEXOL 300 MG/ML  SOLN
150.0000 mL | Freq: Once | INTRAMUSCULAR | Status: AC | PRN
  Administered 2021-05-23: 150 mL via ORAL

## 2021-05-23 MED ORDER — MORPHINE SULFATE (PF) 2 MG/ML IV SOLN: 1.0000 mg | INTRAVENOUS | Status: DC | PRN

## 2021-05-23 MED ORDER — PANTOPRAZOLE 80MG IVPB - SIMPLE MED
80.0000 mg | Freq: Once | INTRAVENOUS | Status: AC
  Administered 2021-05-23: 14:00:00 80 mg via INTRAVENOUS
  Filled 2021-05-23: qty 80

## 2021-05-23 MED ORDER — THIAMINE HCL 100 MG PO TABS
100.0000 mg | ORAL_TABLET | Freq: Every day | ORAL | Status: DC
  Administered 2021-05-23: 100 mg via ORAL
  Filled 2021-05-23: qty 1

## 2021-05-23 MED ORDER — SODIUM CHLORIDE 0.9 % IV SOLN
INTRAVENOUS | Status: DC | PRN
  Administered 2021-05-23 – 2021-05-25 (×2): 250 mL via INTRAVENOUS
  Administered 2021-05-26: 50 mL via INTRAVENOUS

## 2021-05-23 MED ORDER — POTASSIUM CHLORIDE CRYS ER 20 MEQ PO TBCR: 80.0000 meq | EXTENDED_RELEASE_TABLET | Freq: Once | ORAL | Status: DC

## 2021-05-23 MED ORDER — FOLIC ACID 1 MG PO TABS
1.0000 mg | ORAL_TABLET | Freq: Every day | ORAL | Status: DC
  Administered 2021-05-23: 1 mg via ORAL
  Filled 2021-05-23: qty 1

## 2021-05-23 MED ORDER — PIPERACILLIN-TAZOBACTAM 3.375 G IVPB
3.3750 g | Freq: Three times a day (TID) | INTRAVENOUS | Status: DC
  Administered 2021-05-23 – 2021-05-31 (×23): 3.375 g via INTRAVENOUS
  Filled 2021-05-23 (×24): qty 50

## 2021-05-23 MED ORDER — PANTOPRAZOLE INFUSION (NEW) - SIMPLE MED
8.0000 mg/h | INTRAVENOUS | Status: AC
  Administered 2021-05-23 – 2021-05-26 (×7): 8 mg/h via INTRAVENOUS
  Filled 2021-05-23: qty 100
  Filled 2021-05-23: qty 80
  Filled 2021-05-23: qty 100
  Filled 2021-05-23 (×3): qty 80
  Filled 2021-05-23: qty 100
  Filled 2021-05-23: qty 80

## 2021-05-23 MED ORDER — PANTOPRAZOLE SODIUM 40 MG IV SOLR
40.0000 mg | Freq: Once | INTRAVENOUS | Status: AC
  Administered 2021-05-23: 11:00:00 40 mg via INTRAVENOUS
  Filled 2021-05-23: qty 40

## 2021-05-23 MED ORDER — ACETAMINOPHEN 650 MG RE SUPP: 650.0000 mg | Freq: Four times a day (QID) | RECTAL | Status: DC | PRN

## 2021-05-23 MED ORDER — THIAMINE HCL 100 MG/ML IJ SOLN: 100.0000 mg | Freq: Every day | INTRAMUSCULAR | Status: DC

## 2021-05-23 MED ORDER — POTASSIUM CHLORIDE 10 MEQ/100ML IV SOLN
10.0000 meq | INTRAVENOUS | Status: AC
  Administered 2021-05-23 (×2): 10 meq via INTRAVENOUS
  Filled 2021-05-23 (×2): qty 100

## 2021-05-23 MED ORDER — SODIUM CHLORIDE 0.9 % IV SOLN
1.0000 mg | Freq: Every day | INTRAVENOUS | Status: DC
  Administered 2021-05-24 – 2021-05-31 (×7): 1 mg via INTRAVENOUS
  Filled 2021-05-23 (×12): qty 0.2

## 2021-05-23 MED ORDER — MAGNESIUM SULFATE 2 GM/50ML IV SOLN
2.0000 g | Freq: Once | INTRAVENOUS | Status: AC
  Administered 2021-05-23: 2 g via INTRAVENOUS
  Filled 2021-05-23: qty 50

## 2021-05-23 MED ORDER — ADULT MULTIVITAMIN W/MINERALS CH
1.0000 | ORAL_TABLET | Freq: Every day | ORAL | Status: DC
  Administered 2021-05-23: 1 via ORAL
  Filled 2021-05-23: qty 1

## 2021-05-23 MED ORDER — ALBUTEROL SULFATE (2.5 MG/3ML) 0.083% IN NEBU: 2.5000 mg | INHALATION_SOLUTION | RESPIRATORY_TRACT | Status: DC | PRN

## 2021-05-23 MED ORDER — THIAMINE HCL 100 MG/ML IJ SOLN
100.0000 mg | Freq: Every day | INTRAMUSCULAR | Status: DC
  Administered 2021-05-24 – 2021-05-31 (×7): 100 mg via INTRAVENOUS
  Filled 2021-05-23 (×7): qty 2

## 2021-05-23 MED ORDER — LACTATED RINGERS IV SOLN: INTRAVENOUS | Status: DC

## 2021-05-23 MED ORDER — LORAZEPAM 2 MG/ML IJ SOLN: 1.0000 mg | INTRAMUSCULAR | Status: AC | PRN

## 2021-05-23 MED ORDER — LORAZEPAM 1 MG PO TABS: 1.0000 mg | ORAL_TABLET | ORAL | Status: AC | PRN

## 2021-05-23 MED ORDER — SODIUM CHLORIDE 0.9 % IV SOLN: INTRAVENOUS | Status: DC

## 2021-05-23 MED ORDER — LACTATED RINGERS IV BOLUS: 1000.0000 mL | Freq: Once | INTRAVENOUS | Status: DC

## 2021-05-23 MED ORDER — DM-GUAIFENESIN ER 30-600 MG PO TB12: 1.0000 | ORAL_TABLET | Freq: Two times a day (BID) | ORAL | Status: DC | PRN

## 2021-05-23 MED ORDER — POTASSIUM CHLORIDE CRYS ER 20 MEQ PO TBCR
40.0000 meq | EXTENDED_RELEASE_TABLET | ORAL | Status: DC
  Administered 2021-05-23: 13:00:00 40 meq via ORAL
  Filled 2021-05-23: qty 2

## 2021-05-23 MED ORDER — NICOTINE 21 MG/24HR TD PT24
21.0000 mg | MEDICATED_PATCH | Freq: Every day | TRANSDERMAL | Status: DC
  Administered 2021-05-23 – 2021-05-30 (×7): 21 mg via TRANSDERMAL
  Filled 2021-05-23 (×7): qty 1

## 2021-05-23 MED ORDER — MOMETASONE FURO-FORMOTEROL FUM 100-5 MCG/ACT IN AERO
2.0000 | INHALATION_SPRAY | Freq: Two times a day (BID) | RESPIRATORY_TRACT | Status: DC
  Administered 2021-05-24 (×2): 2 via RESPIRATORY_TRACT
  Filled 2021-05-23: qty 8.8

## 2021-05-23 MED ORDER — ALBUTEROL SULFATE HFA 108 (90 BASE) MCG/ACT IN AERS: 2.0000 | INHALATION_SPRAY | RESPIRATORY_TRACT | Status: DC | PRN

## 2021-05-23 MED ORDER — ONDANSETRON HCL 4 MG/2ML IJ SOLN
4.0000 mg | Freq: Three times a day (TID) | INTRAMUSCULAR | Status: DC | PRN
Start: 2021-05-23 — End: 2021-06-02

## 2021-05-23 MED ORDER — ACETAMINOPHEN 325 MG PO TABS: 650.0000 mg | ORAL_TABLET | Freq: Four times a day (QID) | ORAL | Status: DC | PRN

## 2021-05-23 MED ORDER — SODIUM CHLORIDE 0.9 % IV BOLUS
1000.0000 mL | Freq: Once | INTRAVENOUS | Status: AC
  Administered 2021-05-23: 1000 mL via INTRAVENOUS

## 2021-05-23 MED ORDER — IOHEXOL 350 MG/ML SOLN
100.0000 mL | Freq: Once | INTRAVENOUS | Status: AC | PRN
  Administered 2021-05-23: 12:00:00 100 mL via INTRAVENOUS

## 2021-05-23 MED ORDER — PANTOPRAZOLE SODIUM 40 MG IV SOLR
40.0000 mg | Freq: Two times a day (BID) | INTRAVENOUS | Status: DC
  Administered 2021-05-27 – 2021-05-31 (×10): 40 mg via INTRAVENOUS
  Filled 2021-05-23 (×10): qty 40

## 2021-05-23 NOTE — H&P (Addendum)
History and Physical    Timothy Shaffer FKC:[id number] DOB: 1951/11/30 DOA: 06/03/2021  Referring MD/NP/PA:   PCP: Aura Dials, MD   Patient coming from:  The patient is coming from home.  At baseline, pt is independent for most of ADL.        Chief Complaint: rectal bleeding  HPI: Timothy Shaffer is a 70 y.o. 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   Patient states that he has had 3 episodes of rectal bleeding, usually tarry colored stool, then 3, bright red color stool.  Patient has nausea, no vomiting.  Patient has mild abdominal cramping earlier, which is resolved.  Currently no abdominal pain.  Denies symptoms of UTI.  Patient has mild shortness of breath, no cough, chest pain.  No fever or chills.  No symptoms of UTI.  Patient states that he is taking ibuprofen intermittently for pain.  He states that he has cut down alcohol drinking.  ED Course: pt was found to have hemoglobin 11.1 (11.0 on 07/24/2020), negative COVID PCR, potassium 3.1, renal function okay, temperature normal, blood pressure 113/71, heart rate 118, 93, RR 22, oxygen saturation 99% on room air.  Patient is admitted to [facility] bed as inpatient.  Dr. Marius Ditch of GI is consulted.  CTA of abdomen/pelvis:   CT angiogram is negative for evidence for acute gastrointestinal hemorrhage.   Findings of contained perforation at the gastric antrum, concerning for perforated gastric cancer, or alternatively gastric ulcer. There are suspicious small soft tissue implants/nodules within the gastrocolic ligament, gastrohepatic ligament, as well as a borderline pre duodenal lymph node. Surgical evaluation is indicated.   These above preliminary results were discussed by telephone at the time of interpretation on 05/31/2021  at 2:40 pm with Dr. Tamala Julian.   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 As previously mentioned, MRI may be useful for further evaluation.   Aortic atherosclerosis and mesenteric arterial disease, without evidence of high-grade stenosis. Aortic Atherosclerosis (ICD10-I70.0).   Bilateral renal arterial disease without high-grade stenosis.   Additional ancillary findings as above.   Signed,     Review of Systems:   General: no fevers, chills, no body weight gain, has fatigue HEENT: no blurry vision, hearing changes or sore throat Respiratory: no dyspnea, coughing, wheezing CV: no chest pain, no palpitations GI: has nausea and rectal bleeding.  No vomiting, abdominal pain, diarrhea, constipation GU: no dysuria, burning on urination, increased urinary frequency, hematuria  Ext: no leg edema Neuro: no unilateral weakness, numbness, or tingling, no vision change or hearing loss Skin: no rash, no skin tear. MSK: No muscle spasm, no deformity, no limitation of range of movement in spin Heme: No easy bruising.  Travel history: No recent long distant travel.  Allergy:  Allergies  Allergen Reactions   Nsaids     History of perforated ulcer    Past Medical History:  Diagnosis Date   AAA (abdominal aortic aneurysm) (HCC)    Asthma    allergies   Complication of anesthesia    Family history of adverse reaction to anesthesia    PONV   Perforated duodenal ulcer ([place]) 05/21/1992   Emergency surgery at [facility], [place], [place]   PONV (postoperative nausea and vomiting)    PUD (peptic ulcer disease)     Past Surgical History:  Procedure Laterality Date   COLONOSCOPY  04/2020  MASS EXCISION Left 04/24/2014   Procedure: LEFT LONG & RING FINGERS DISTAL INTERPHALANGEAL JOINT/CYST EXCISION;  Surgeon: Cammie Sickle., MD;  Location: [facility];  Service: Orthopedics;  Laterality: Left;   REPAIR OF PERFORATED ULCER  1993   [facility], [place], [place]    Social  History:  reports that he has been smoking cigarettes. He has a 7.50 pack-year smoking history. He has never used smokeless tobacco. He reports current alcohol use of about 12.0 standard drinks of alcohol per week. He reports current drug use. Drug: Marijuana.  Family History:  Family History  Problem Relation Age of Onset   Gallbladder disease Sister    Breast cancer Daughter      Prior to Admission medications   Medication Sig Start Date End Date Taking? Authorizing Provider  albuterol (VENTOLIN HFA) 108 (90 Base) MCG/ACT inhaler Inhale 1-2 puffs into the lungs every 6 (six) hours as needed for wheezing or shortness of breath.   Yes [provider]  budesonide-formoterol (SYMBICORT) 80-4.5 MCG/ACT inhaler 2 puffs Patient not taking: Reported on 05/13/2021 02/19/21   [provider]    Physical Exam: Vitals:   05/19/2021 1043 05/10/2021 1045 05/13/2021 1130 05/20/2021 1230  BP:   113/71   Pulse: (!) 118  (!) 107 95  Resp: 18  (!) 22 (!) 23  Temp:    98.4 F (36.9 C)  TempSrc:    Oral  SpO2: 99%  99% 100%  Weight:  56.7 kg    Height:  5\' 11"  (1.803 m)     General: Not in acute distress HEENT:       Eyes: PERRL, EOMI, no scleral icterus.       ENT: No discharge from the ears and nose, no pharynx injection, no tonsillar enlargement.        Neck: No JVD, no bruit, no mass felt. Heme: No neck lymph node enlargement. Cardiac: S1/S2, RRR, No murmurs, No gallops or rubs. Respiratory: No rales, wheezing, rhonchi or rubs. GI: Soft, nondistended, nontender, no rebound pain, no organomegaly, BS present. GU: No hematuria Ext: No pitting leg edema bilaterally. 1+DP/PT pulse bilaterally. Musculoskeletal: No joint deformities, No joint redness or warmth, no limitation of ROM in spin. Skin: No rashes.  Neuro: Alert, oriented X3, cranial nerves II-XII grossly intact, moves all extremities normally.  Psych: Patient is not psychotic, no suicidal or hemocidal ideation.  Labs on  Admission: I have personally reviewed following labs and imaging studies  CBC: Recent Labs  Lab 05/26/2021 1048  WBC 14.5*  NEUTROABS 12.2*  HGB 11.1*  HCT 31.8*  MCV 106.7*  PLT 240   Basic Metabolic Panel: Recent Labs  Lab 05/22/2021 1048  NA 131*  K 3.1*  CL 100  CO2 24  GLUCOSE 174*  BUN 28*  CREATININE 0.88  CALCIUM 8.2*  MG 1.4*   GFR: Estimated Creatinine Clearance: 62.6 mL/min (by C-G formula based on SCr of 0.88 mg/dL). Liver Function Tests: Recent Labs  Lab 05/07/2021 1048  AST 32  ALT 24  ALKPHOS 59  BILITOT 1.3*  PROT 5.2*  ALBUMIN 3.2*   No results for input(s): LIPASE, AMYLASE in the last 168 hours. No results for input(s): AMMONIA in the last 168 hours. Coagulation Profile: Recent Labs  Lab 05/21/2021 1048  INR 1.0   Cardiac Enzymes: No results for input(s): CKTOTAL, CKMB, CKMBINDEX, TROPONINI in the last 168 hours. BNP (last 3 results) No results for input(s): PROBNP in the last 8760 hours. HbA1C: No results  for input(s): HGBA1C in the last 72 hours. CBG: No results for input(s): GLUCAP in the last 168 hours. Lipid Profile: No results for input(s): CHOL, HDL, LDLCALC, TRIG, CHOLHDL, LDLDIRECT in the last 72 hours. Thyroid Function Tests: No results for input(s): TSH, T4TOTAL, FREET4, T3FREE, THYROIDAB in the last 72 hours. Anemia Panel: No results for input(s): VITAMINB12, FOLATE, FERRITIN, TIBC, IRON, RETICCTPCT in the last 72 hours. Urine analysis:    Component Value Date/Time   COLORURINE AMBER (A) 04/29/2021 1115   APPEARANCEUR HAZY (A) 04/29/2021 1115   LABSPEC >1.030 (H) 04/29/2021 1115   PHURINE 5.5 04/29/2021 1115   GLUCOSEU NEGATIVE 04/29/2021 1115   HGBUR NEGATIVE 04/29/2021 1115   BILIRUBINUR SMALL (A) 04/29/2021 1115   KETONESUR TRACE (A) 04/29/2021 1115   PROTEINUR 30 (A) 04/29/2021 1115   NITRITE NEGATIVE 04/29/2021 1115   LEUKOCYTESUR NEGATIVE 04/29/2021 1115   Sepsis  Labs: @LABRCNTIP (procalcitonin:4,lacticidven:4) ) Recent Results (from the past 240 hour(s))  Resp Panel by RT-PCR (Flu A&B, Covid) Nasopharyngeal Swab     Status: None   Collection Time: 06/04/2021 10:59 AM   Specimen: Nasopharyngeal Swab; Nasopharyngeal(NP) swabs in vial transport medium  Result Value Ref Range Status   SARS Coronavirus 2 by RT PCR NEGATIVE NEGATIVE Final    Comment: (NOTE) SARS-CoV-2 target nucleic acids are NOT DETECTED.  The SARS-CoV-2 RNA is generally detectable in upper respiratory specimens during the acute phase of infection. The lowest concentration of SARS-CoV-2 viral copies this assay can detect is 138 copies/mL. A negative result does not preclude SARS-Cov-2 infection and should not be used as the sole basis for treatment or other patient management decisions. A negative result may occur with  improper specimen collection/handling, submission of specimen other than nasopharyngeal swab, presence of viral mutation(s) within the areas targeted by this assay, and inadequate number of viral copies(<138 copies/mL). A negative result must be combined with clinical observations, patient history, and epidemiological information. The expected result is Negative.  Fact Sheet for Patients:  EntrepreneurPulse.com.au  Fact Sheet for Healthcare Providers:  IncredibleEmployment.be  This test is no t yet approved or cleared by the [place] FDA and  has been authorized for detection and/or diagnosis of SARS-CoV-2 by FDA under an Emergency Use Authorization (EUA). This EUA will remain  in effect (meaning this test can be used) for the duration of the COVID-19 declaration under Section 564(b)(1) of the Act, 21 U.S.C.section 360bbb-3(b)(1), unless the authorization is terminated  or revoked sooner.       Influenza A by PCR NEGATIVE NEGATIVE Final   Influenza B by PCR NEGATIVE NEGATIVE Final    Comment: (NOTE) The Xpert Xpress  SARS-CoV-2/FLU/RSV plus assay is intended as an aid in the diagnosis of influenza from Nasopharyngeal swab specimens and should not be used as a sole basis for treatment. Nasal washings and aspirates are unacceptable for Xpert Xpress SARS-CoV-2/FLU/RSV testing.  Fact Sheet for Patients: EntrepreneurPulse.com.au  Fact Sheet for Healthcare Providers: IncredibleEmployment.be  This test is not yet approved or cleared by the [place] FDA and has been authorized for detection and/or diagnosis of SARS-CoV-2 by FDA under an Emergency Use Authorization (EUA). This EUA will remain in effect (meaning this test can be used) for the duration of the COVID-19 declaration under Section 564(b)(1) of the Act, 21 U.S.C. section 360bbb-3(b)(1), unless the authorization is terminated or revoked.  Performed at [facility], [address], [place], [place] [address]      Radiological Exams on Admission: CT Angio Abd/Pel W  and/or Wo Contrast  Result Date: 05/30/2021 CLINICAL DATA:  70 year old male with a history of GI bleeding EXAM: CTA ABDOMEN AND PELVIS WITHOUT AND WITH CONTRAST TECHNIQUE: Multidetector CT imaging of the abdomen and pelvis was performed using the standard protocol during bolus administration of intravenous contrast. Multiplanar reconstructed images and MIPs were obtained and reviewed to evaluate the vascular anatomy. CONTRAST:  156mL OMNIPAQUE IOHEXOL 350 MG/ML SOLN COMPARISON:  03/12/2021, 07/20/2020 FINDINGS: VASCULAR Aorta: Diameter of the aorta at the hiatus measures 2.6 cm. Mild atherosclerotic changes of the abdominal aorta. No ulcerated plaque or pedunculated plaque. No dissection. Juxtarenal aorta measures 2.3 cm. Largest diameter of the infrarenal abdominal aorta measures 2.4 cm. Celiac: Celiac artery is patent with mild atherosclerotic changes. Stenosis of the left gastric artery origin. SMA: Moderate atherosclerotic changes at the  SMA origin including soft plaque which narrows the lumen beyond the origin by estimated 50%. Branches are patent. Renals: - Right: Mild to moderate atherosclerotic changes at the right renal artery origin without evidence of high-grade stenosis. - Left: Mild to moderate atherosclerotic changes of the left renal artery origin without high-grade stenosis. IMA: IMA is patent Right lower extremity: Unremarkable course, caliber, and contour of the right iliac system. Moderate atherosclerotic changes of the right iliac system without high-grade stenosis or occlusion. No aneurysm, dissection, or occlusion. Hypogastric artery is patent, with atherosclerotic changes. Common femoral artery patent. Proximal SFA and profunda femoris patent. Left lower extremity: Unremarkable course, caliber, and contour of the left iliac system. Moderate atherosclerotic changes of the left iliac system without high-grade stenosis or occlusion. No aneurysm, dissection, or occlusion. Hypogastric artery is patent, with atherosclerotic changes. Common femoral artery patent. Proximal SFA and profunda femoris patent. Veins: Unremarkable appearance of the venous system. Review of the MIP images confirms the above findings. NON-VASCULAR Lower chest: No acute. Hepatobiliary: Diffusely decreased attenuation of liver parenchyma no focal lesions identified. Focal fatty sparing within lower segment 3. Gallbladder is relatively decompressed. Pancreas: Unremarkable. Spleen: Unremarkable. Adrenals/Urinary Tract: - Right adrenal gland: Unremarkable - Left adrenal gland: Unremarkable. - Right kidney: No hydronephrosis, nephrolithiasis, inflammation, or ureteral dilation. No focal lesion. - Left Kidney: No hydronephrosis, nephrolithiasis, inflammation, or ureteral dilation. Interpolar mass of the lateral left renal cortex estimated 2.9 cm, similar to the prior CT and concerning for renal cell carcinoma. - Urinary Bladder: Urinary bladder is relatively  decompressed. Stomach/Bowel: - Stomach: Surgical changes along the lesser curvature of the stomach. Inflammatory changes in the region of the gastric antrum and pylorus, with decompression of the stomach lumen. There is gas at the posterior aspect of the stomach antrum, separate from the lumen of the stomach in 3 locations, image 66, image 75, and image 71 of series 5. There are enhancing small nodules within the gastrocolic ligament, image 81 of series 5 and image 33 of series 7. Edema within the associated fat. There is a borderline enlarged lymph node measuring 8 mm-9 mm in the pre duodenal region, new from the comparison CT. - Small bowel: Unremarkable. No accumulation of contrast or air-fluid levels. - Appendix: Appendix is not visualized, however, no inflammatory changes are present adjacent to the cecum to indicate an appendicitis. - Colon: Surgical changes of the left colon. No evidence of obstruction. No focal inflammatory changes. No accumulation of contrast. No air-fluid levels. Lymphatic: No inguinal adenopathy. No pelvic adenopathy. No periaortic adenopathy. Lymph node anterior to the duodenum, 8 mm-9 mm. There are small nodules in the gastrocolic ligament, as described above. Small nodules in the  gastrohepatic ligament. Mesenteric: No significant free fluid. Reproductive: Transverse diameter of the prostate 37 mm. Other: No hernia. Musculoskeletal: No evidence of acute fracture. No bony canal narrowing. Degenerative changes of the spine. Degenerative changes of the hips. No aggressive lytic or sclerotic lesions identified. IMPRESSION: CT angiogram is negative for evidence for acute gastrointestinal hemorrhage. Findings of contained perforation at the gastric antrum, concerning for perforated gastric cancer, or alternatively gastric ulcer. There are suspicious small soft tissue implants/nodules within the gastrocolic ligament, gastrohepatic ligament, as well as a borderline pre duodenal lymph node.  Surgical evaluation is indicated. These above preliminary results were discussed by telephone at the time of interpretation on 05/08/2021 at 2:40 pm with Dr. Tamala Julian. 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 As previously mentioned, MRI may be useful for further evaluation. Aortic atherosclerosis and mesenteric arterial disease, without evidence of high-grade stenosis. Aortic Atherosclerosis (ICD10-I70.0). Bilateral renal arterial disease without high-grade stenosis. Additional ancillary findings as above. Signed, Dulcy Fanny. Dellia Nims, RPVI [facility] Radiology Electronically Signed   By: Corrie Mckusick D.O.   On: 06/01/2021 14:49     EKG: I have personally reviewed.  Sinus rhythm, QTC 504, low voltage, LAD, early R wave progression  Assessment/Plan Principal Problem:   GI bleeding Active Problems:   PUD (peptic ulcer disease)   Tobacco abuse   Moderate alcohol consumption   Asthma   Hypokalemia   Leukocytosis   Anemia due to blood loss   Left renal mass   Hypomagnesemia   Sepsis (HCC)   GI bleeding and anemia due to blood loss: Hgb 11.0 -->11.1. pending CTA-abd/pelvis. Dr. Marius Ditch of GI is consulted.  -will admit to med-surg bed as inpt - IVF: 100 mL/hr of LR - Start IV pantoprazole gtt - Zofran IV for nausea - Avoid NSAIDs and SQ heparin - Maintain IV access (2 large bore IVs if possible). - Monitor closely and follow q6h cbc, transfuse as necessary, if Hgb<7.0 - LaB: INR, PTT and type screen  Addendum: Sepsis due to gastric perforation. CTA showed contained perforation at the gastric antrum, concerning for perforated gastric cancer, or alternatively gastric ulcer. Pt has leukocytosis with WBC 14.5, tachycardia with heart rate up to 118, tachypnea with RR 22, meets criteria for sepsis.  Will start antibiotics. -will start zosyn -get blood culture -hold all oral meds. -will get Procalcitonin and trend lactic acid levels  per sepsis protocol. -IVF: 1L of NS bolus in ED, followed by 100 cc/h --> will give more  bolus if lactic acid is elevated. -consulted general surgeon, Dr. Dahlia Byes  Tobacco abuse and moderate alcohol consumption: -Nicotine patch -CiWA protocol  Asthma: stable. -Bronchodilators  Hypokalemia and hypomagnesemia:  K=  3.1 and Mg 1.4on admission. - Repleted K and Mg  Left renal mass: Chart review revealed that Dr. Glori Luis of urology gave pt a referral to interventional radiology for consideration of left renal mass biopsy and percutaneous ablation. Pt states that he will have discussion about this plan on 06/03/21.  [facility] abdomen at [facility] on 04/04/21 1.  There is a complex enhancing interpolar left renal mass with both endophytic and exophytic components. This is compatible with renal cell carcinoma.  2.  No evidence of metastatic disease in the abdomen.  3.  Hepatic steatosis.       DVT ppx: SCD Code Status: Full code Family Communication: Yes, patient's wife   at bed side Disposition Plan:  Anticipate discharge back to previous environment Consults called:  Dr. Marius Ditch of GI and Dr. Dahlia Byes of surgery Admission status and Level of care: Med-Surg:    as inpt      Status is: Inpatient  Remains inpatient appropriate because:Inpatient level of care appropriate due to severity of illness  Dispo: The patient is from: Home              Anticipated d/c is to: Home              Patient currently is not medically stable to d/c.   Difficult to place patient No         Date of Service 05/31/2021    [facility]   If 7PM-7AM, please contact night-coverage [email or web address] 05/15/2021, 2:54 PM

## 2021-05-23 NOTE — ED Notes (Signed)
Lab called to assist with added blood draw specimans

## 2021-05-23 NOTE — ED Notes (Signed)
Pt had a BM and it appears to be dark bright red blood.

## 2021-05-23 NOTE — ED Provider Notes (Addendum)
[facility] Emergency Department Provider Note  ____________________________________________   Event Date/Time   First MD Initiated Contact with Patient 05/26/2021 1048     (approximate)  I have reviewed the triage vital signs and the nursing notes.   HISTORY  Chief Complaint GI Bleeding   HPI Timothy Shaffer is a 70 y.o. 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  Patient denies any prior similar episodes but notes that when he went for a bowel movement today stool was quite black initially.  He states that he did had some diarrhea shortly after it was bright red.  He states another episode after this was also bright red.  He denies any urinary symptoms or blood in his urine, back pain, chest pain, cough, shortness of breath, headache area, sore throat, recent traumatic injuries or falls, rash or extremity pain.  States he takes improvement intermittently but no significant amount recently.  He drinks a beer intermittently but is not a daily EtOH user.  States he has never had significant GI bleeding in the past.  No other acute concerns at this time.         Past Medical History:  Diagnosis Date   AAA (abdominal aortic aneurysm) (HCC)    Asthma    allergies   Complication of anesthesia    Family history of adverse reaction to anesthesia    PONV   Perforated duodenal ulcer ([place]) 05/21/1992   Emergency surgery at [facility], [place], [place]   PONV (postoperative nausea and vomiting)    PUD (peptic ulcer disease)     Patient Active Problem List   Diagnosis Date Noted   GI bleeding 05/13/2021   Asthma 05/15/2021   Hypokalemia 05/30/2021   Leukocytosis 05/11/2021   Anemia due to blood loss 05/22/2021   Left renal mass  05/15/2021   Hypomagnesemia 05/09/2021   Sepsis ([place]) 05/13/2021   Malnutrition of moderate degree 07/24/2020   Shock circulatory ([place]) 07/20/2020   Spontaneous tension pneumothorax s/p chest tube decompression 07/18/2020 07/19/2020   Hyperglycemia 07/19/2020   Acute respiratory failure (HCC)    Pneumonia    Mass of cecum 07/17/2020   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 Authorizing Provider  albuterol (VENTOLIN HFA) 108 (90 Base) MCG/ACT inhaler Inhale 1-2 puffs into the lungs every 6 (six) hours as needed for wheezing or shortness of breath.   Yes [provider]  budesonide-formoterol (SYMBICORT) 80-4.5 MCG/ACT inhaler 2 puffs Patient not taking: Reported on 05/13/2021 02/19/21   [provider]    Allergies Nsaids  Family History  Problem Relation Age of Onset   Gallbladder disease Sister    Breast cancer  Daughter     Social History Social History   Tobacco Use   Smoking status: Every Day    Packs/day: 0.50    Years: 15.00    Pack years: 7.50    Types: Cigarettes   Smokeless tobacco: Never  Vaping Use   Vaping Use: Never used  Substance Use Topics   Alcohol use: Yes    Alcohol/week: 12.0 standard drinks    Types: 12 Cans of beer per week   Drug use: Yes    Types: Marijuana    Review of Systems  Review of Systems   Constitutional:  Negative for chills and fever.  HENT:  Negative for sore throat.   Eyes:  Negative for pain.  Respiratory:  Negative for cough and stridor.   Cardiovascular:  Negative for chest pain.  Gastrointestinal:  Positive for abdominal pain, blood in stool, diarrhea and melena. Negative for vomiting.  Genitourinary:  Negative for dysuria.  Musculoskeletal:  Negative for myalgias.  Skin:  Negative for rash.  Neurological:  Negative for seizures, loss of consciousness and headaches.  Psychiatric/Behavioral:  Negative for suicidal ideas.   All other systems reviewed and are negative.    ____________________________________________   PHYSICAL EXAM:  VITAL SIGNS: ED Triage Vitals  Enc Vitals Group     BP --      Pulse Rate 05/22/2021 1043 (!) 118     Resp 05/10/2021 1043 18     Temp --      Temp src --      SpO2 05/19/2021 1043 99 %     Weight 05/22/2021 1045 125 lb (56.7 kg)     Height 05/15/2021 1045 5\' 11"  (1.803 m)     Head Circumference --      Peak Flow --      Pain Score 05/20/2021 1045 0     Pain Loc --      Pain Edu? --      Excl. in [place]? --    Vitals:   05/07/2021 1230 05/16/2021 1415  BP:  117/82  Pulse: 95 86  Resp: (!) 23 18  Temp: 98.4 F (36.9 C)   SpO2: 100% 99%   Physical Exam Vitals and nursing note reviewed.  Constitutional:      Appearance: He is well-developed.  HENT:     Head: Normocephalic and atraumatic.     Right Ear: External ear normal.     Left Ear: External ear normal.     Nose: Nose normal.  Eyes:     Conjunctiva/sclera: Conjunctivae normal.  Cardiovascular:     Rate and Rhythm: Normal rate and regular rhythm.     Heart sounds: No murmur heard. Pulmonary:     Effort: Pulmonary effort is normal. No respiratory distress.     Breath sounds: Normal breath sounds.  Abdominal:     Palpations: Abdomen is soft.     Tenderness: There is no abdominal tenderness.  Musculoskeletal:     Cervical back: Neck supple.  Skin:    General: Skin is  warm and dry.     Capillary Refill: Capillary refill takes less than 2 seconds.  Neurological:     Mental Status: He is alert and oriented to person, place, and time.  Psychiatric:        Mood and Affect: Mood normal.     ____________________________________________   LABS (all labs ordered are listed, but only abnormal results are displayed)  Labs Reviewed  CBC WITH DIFFERENTIAL/PLATELET - Abnormal; Notable for the following  components:      Result Value   WBC 14.5 (*)    RBC 2.98 (*)    Hemoglobin 11.1 (*)    HCT 31.8 (*)    MCV 106.7 (*)    MCH 37.2 (*)    Neutro Abs 12.2 (*)    Monocytes Absolute 1.2 (*)    Abs Immature Granulocytes 0.35 (*)    All other components within normal limits  COMPREHENSIVE METABOLIC PANEL - Abnormal; Notable for the following components:   Sodium 131 (*)    Potassium 3.1 (*)    Glucose, Bld 174 (*)    BUN 28 (*)    Calcium 8.2 (*)    Total Protein 5.2 (*)    Albumin 3.2 (*)    Total Bilirubin 1.3 (*)    All other components within normal limits  MAGNESIUM - Abnormal; Notable for the following components:   Magnesium 1.4 (*)    All other components within normal limits  RESP PANEL BY RT-PCR (FLU A&B, COVID) ARPGX2  CULTURE, BLOOD (ROUTINE X 2)  CULTURE, BLOOD (ROUTINE X 2)  PROTIME-INR  CBC  CBC  CBC  PHOSPHORUS  LACTIC ACID, PLASMA  LACTIC ACID, PLASMA  PROCALCITONIN  TYPE AND SCREEN   ____________________________________________  EKG  Sinus tachycardia with a ventricular to 115, multiple nonspecific findings in inferior leads and otherwise QTc interval of 504. ____________________________________________  RADIOLOGY  ED MD interpretation: CT abdomen pelvis remarkable for possible contained gastric perforation versus ulcer.  There is also some suspicious nodules.  No other active bleeding or other clear acute abdominopelvic process noted.  Official radiology report(s): CT Angio Abd/Pel W and/or Wo Contrast  Result Date:  05/22/2021 CLINICAL DATA:  70 year old male with a history of GI bleeding EXAM: CTA ABDOMEN AND PELVIS WITHOUT AND WITH CONTRAST TECHNIQUE: Multidetector CT imaging of the abdomen and pelvis was performed using the standard protocol during bolus administration of intravenous contrast. Multiplanar reconstructed images and MIPs were obtained and reviewed to evaluate the vascular anatomy. CONTRAST:  149mL OMNIPAQUE IOHEXOL 350 MG/ML SOLN COMPARISON:  03/12/2021, 07/20/2020 FINDINGS: VASCULAR Aorta: Diameter of the aorta at the hiatus measures 2.6 cm. Mild atherosclerotic changes of the abdominal aorta. No ulcerated plaque or pedunculated plaque. No dissection. Juxtarenal aorta measures 2.3 cm. Largest diameter of the infrarenal abdominal aorta measures 2.4 cm. Celiac: Celiac artery is patent with mild atherosclerotic changes. Stenosis of the left gastric artery origin. SMA: Moderate atherosclerotic changes at the SMA origin including soft plaque which narrows the lumen beyond the origin by estimated 50%. Branches are patent. Renals: - Right: Mild to moderate atherosclerotic changes at the right renal artery origin without evidence of high-grade stenosis. - Left: Mild to moderate atherosclerotic changes of the left renal artery origin without high-grade stenosis. IMA: IMA is patent Right lower extremity: Unremarkable course, caliber, and contour of the right iliac system. Moderate atherosclerotic changes of the right iliac system without high-grade stenosis or occlusion. No aneurysm, dissection, or occlusion. Hypogastric artery is patent, with atherosclerotic changes. Common femoral artery patent. Proximal SFA and profunda femoris patent. Left lower extremity: Unremarkable course, caliber, and contour of the left iliac system. Moderate atherosclerotic changes of the left iliac system without high-grade stenosis or occlusion. No aneurysm, dissection, or occlusion. Hypogastric artery is patent, with atherosclerotic  changes. Common femoral artery patent. Proximal SFA and profunda femoris patent. Veins: Unremarkable appearance of the venous system. Review of the MIP images confirms the above findings. NON-VASCULAR Lower chest: No acute. Hepatobiliary: Diffusely decreased  attenuation of liver parenchyma no focal lesions identified. Focal fatty sparing within lower segment 3. Gallbladder is relatively decompressed. Pancreas: Unremarkable. Spleen: Unremarkable. Adrenals/Urinary Tract: - Right adrenal gland: Unremarkable - Left adrenal gland: Unremarkable. - Right kidney: No hydronephrosis, nephrolithiasis, inflammation, or ureteral dilation. No focal lesion. - Left Kidney: No hydronephrosis, nephrolithiasis, inflammation, or ureteral dilation. Interpolar mass of the lateral left renal cortex estimated 2.9 cm, similar to the prior CT and concerning for renal cell carcinoma. - Urinary Bladder: Urinary bladder is relatively decompressed. Stomach/Bowel: - Stomach: Surgical changes along the lesser curvature of the stomach. Inflammatory changes in the region of the gastric antrum and pylorus, with decompression of the stomach lumen. There is gas at the posterior aspect of the stomach antrum, separate from the lumen of the stomach in 3 locations, image 66, image 75, and image 71 of series 5. There are enhancing small nodules within the gastrocolic ligament, image 81 of series 5 and image 33 of series 7. Edema within the associated fat. There is a borderline enlarged lymph node measuring 8 mm-9 mm in the pre duodenal region, new from the comparison CT. - Small bowel: Unremarkable. No accumulation of contrast or air-fluid levels. - Appendix: Appendix is not visualized, however, no inflammatory changes are present adjacent to the cecum to indicate an appendicitis. - Colon: Surgical changes of the left colon. No evidence of obstruction. No focal inflammatory changes. No accumulation of contrast. No air-fluid levels. Lymphatic: No inguinal  adenopathy. No pelvic adenopathy. No periaortic adenopathy. Lymph node anterior to the duodenum, 8 mm-9 mm. There are small nodules in the gastrocolic ligament, as described above. Small nodules in the gastrohepatic ligament. Mesenteric: No significant free fluid. Reproductive: Transverse diameter of the prostate 37 mm. Other: No hernia. Musculoskeletal: No evidence of acute fracture. No bony canal narrowing. Degenerative changes of the spine. Degenerative changes of the hips. No aggressive lytic or sclerotic lesions identified. IMPRESSION: CT angiogram is negative for evidence for acute gastrointestinal hemorrhage. Findings of contained perforation at the gastric antrum, concerning for perforated gastric cancer, or alternatively gastric ulcer. There are suspicious small soft tissue implants/nodules within the gastrocolic ligament, gastrohepatic ligament, as well as a borderline pre duodenal lymph node. Surgical evaluation is indicated. These above preliminary results were discussed by telephone at the time of interpretation on 06/01/2021 at 2:40 pm with Dr. Tamala Julian. 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 As previously mentioned, MRI may be useful for further evaluation. Aortic atherosclerosis and mesenteric arterial disease, without evidence of high-grade stenosis. Aortic Atherosclerosis (ICD10-I70.0). Bilateral renal arterial disease without high-grade stenosis. Additional ancillary findings as above. Signed, Dulcy Fanny. Dellia Nims, RPVI [facility] Electronically Signed   By: Corrie Mckusick D.O.   On: 05/27/2021 14:49    ____________________________________________   PROCEDURES  Procedure(s) performed (including Critical Care):  .1-3 Lead EKG Interpretation  Date/Time: 05/10/2021 12:56 PM Performed by: Lucrezia Starch, MD Authorized by: Lucrezia Starch, MD     Interpretation: normal     ECG rate assessment: tachycardic      Rhythm: sinus tachycardia     Ectopy: none     Conduction: normal     ____________________________________________   INITIAL IMPRESSION / ASSESSMENT AND PLAN / ED COURSE      Patient presents with above-stated history exam for assessment of some Abdominal pain started today associate with some initially melanotic stools followed by some grossly bloody diarrhea.  Per EMS he was slightly hypotensive did not report  exact numbers and received 500 cc of normal saline in route.  On arrival patient is tachycardic with heart rate of 118 with otherwise stable vital signs.  No active bleeding external hemorrhoids or fissures visualized on external rectal exam.  Primary differential includes bleeding polyp, recurrence of malignancy, AVM, diverticulitis, possible bleeding peptic ulcer or gastritis.  CBC with WBC count of 14.5, hemoglobin of 11.1 and platelets 184.  CMP with NA of 131, K of 3.1 and otherwise no significant electrolyte or metabolic derangements.  Is unremarkable.  COVID influenza labs sent.  Patient started on Protonix and CT ordered to assess for possibility of diverticulitis.  I will plan to admit to medicine service for further evaluation and management.  CT abdomen pelvis remarkable for possible contained gastric perforation versus ulcer.  There is also some suspicious nodules.  No other active bleeding or other clear acute abdominopelvic process noted.  I discussed with general surgeon Dr. Adora Fridge who will see the patient.  Patient mated to medicine service with also plan for GI consult.      ____________________________________________   FINAL CLINICAL IMPRESSION(S) / ED DIAGNOSES  Final diagnoses:  Gastrointestinal hemorrhage, unspecified gastrointestinal hemorrhage type  Hypokalemia  Hypomagnesemia    Medications  pantoprozole (PROTONIX) 80 mg /NS 100 mL infusion (8 mg/hr Intravenous New Bag/Given 05/29/2021 1448)  pantoprazole (PROTONIX) injection 40 mg (has no  administration in time range)  nicotine (NICODERM CQ - dosed in mg/24 hours) patch 21 mg (21 mg Transdermal Patch Applied 05/14/2021 1441)  morphine 2 MG/ML injection 1 mg (has no administration in time range)  lactated ringers infusion (has no administration in time range)  LORazepam (ATIVAN) tablet 1-4 mg (has no administration in time range)    Or  LORazepam (ATIVAN) injection 1-4 mg (has no administration in time range)  albuterol (PROVENTIL) (2.5 MG/3ML) 0.083% nebulizer solution 2.5 mg (has no administration in time range)  magnesium sulfate IVPB 2 g 50 mL (2 g Intravenous New Bag/Given 05/09/2021 1454)  ondansetron (ZOFRAN) injection 4 mg (has no administration in time range)  acetaminophen (TYLENOL) suppository 650 mg (has no administration in time range)  folic acid 1 mg in sodium chloride 0.9 % 50 mL IVPB (has no administration in time range)  thiamine (B-1) injection 100 mg (has no administration in time range)  potassium chloride 10 mEq in 100 mL IVPB (has no administration in time range)  lactated ringers bolus 1,000 mL (has no administration in time range)  pantoprazole (PROTONIX) injection 40 mg (40 mg Intravenous Given 06/01/2021 1055)  iohexol (OMNIPAQUE) 350 MG/ML injection 100 mL (100 mLs Intravenous Contrast Given 05/13/2021 1147)  pantoprazole (PROTONIX) 80 mg /NS 100 mL IVPB (80 mg Intravenous New Bag/Given 05/31/2021 1414)     ED Discharge Orders     None        Note:  This document was prepared using Dragon voice recognition software and may include unintentional dictation errors.    Lucrezia Starch, MD 05/12/2021 1433    Lucrezia Starch, MD 05/22/2021 [phone]

## 2021-05-23 NOTE — Progress Notes (Signed)
Pharmacy Antibiotic Note  Timothy Shaffer is a 70 y.o. male admitted on 05/30/2021 with sepsis/gastric perforation.  Pharmacy has been consulted for Zosyn dosing.  Plan: Zosyn 3.375g IV q8h (4 hour infusion).    Height: 5\' 11"  (180.3 cm) Weight: 56.7 kg (125 lb) IBW/kg (Calculated) : 75.3  Temp (24hrs), Avg:98.4 F (36.9 C), Min:98.4 F (36.9 C), Max:98.4 F (36.9 C)  Recent Labs  Lab 05/18/2021 1048  WBC 14.5*  CREATININE 0.88    Estimated Creatinine Clearance: 62.6 mL/min (by C-G formula based on SCr of 0.88 mg/dL).    Allergies  Allergen Reactions   Nsaids     History of perforated ulcer    Antimicrobials this admission: Zosyn  6/17  >>    Dose adjustments this admission:    Microbiology results: 6/17 BCx: pend   UCx:      Sputum:      MRSA PCR:    Thank you for allowing pharmacy to be a part of this patient's care.  Danae Oland A 05/09/2021 3:38 PM

## 2021-05-23 NOTE — Consult Note (Signed)
Patient ID: Timothy Shaffer, male   DOB: [birth year], 70 y.o.   MRN: [id number]  HPI Timothy Shaffer is a 70 y.o. male seen in consultation at the request of Dr. Tamala Shaffer.  He presented today with 4 episodes of watery bowel movements.  The first 1 was melanotic and then the last 3 ones were bright red blood.  Denies abdominal pain.  Some nausea.  No fevers no chills.  He does endorse that he uses NSAIDs a few times a week.  Had a prior history of perforated ulcer more than 3 decades ago requiring exploratory laparotomy and oversewing of perforated ulcer.  More recently had a right hemicolectomy by Dr. Johney Shaffer for unresectable adenoma. Have personally reviewed the CT scan showing evidence of inflammation and questionable perforation within the duodenal bulb.  No obvious free air.  No masses no ascites.  I have compared this to the one 2 months ago and at that time this was unremarkable. He also had a left renal lesion.  BC shows a hemoglobin 11 white count of 14 and platelets of 184.  CMP was completely normal other than mild hypokalemia and mild hyponatremia ,  INR nml He Does smoke daily half a pack of cigarettes His wife is with him at the bedside.  HPI  Past Medical History:  Diagnosis Date   AAA (abdominal aortic aneurysm) (HCC)    Asthma    allergies   Complication of anesthesia    Family history of adverse reaction to anesthesia    PONV   Perforated duodenal ulcer (Timothy Shaffer) 05/21/1992   Emergency surgery at [facility], [place], [place]   PONV (postoperative nausea and vomiting)    PUD (peptic ulcer disease)     Past Surgical History:  Procedure Laterality Date   COLONOSCOPY  04/2020   MASS EXCISION Left 04/24/2014   Procedure: LEFT LONG & RING FINGERS DISTAL INTERPHALANGEAL JOINT/CYST EXCISION;  Surgeon: Timothy Shaffer., MD;  Location: [facility];  Service: Orthopedics;  Laterality: Left;   REPAIR OF PERFORATED ULCER  1993   [facility], [place], [place]    Family History  Problem Relation  Age of Onset   Gallbladder disease Sister    Breast cancer Daughter     Social History Social History   Tobacco Use   Smoking status: Every Day    Packs/day: 0.50    Years: 15.00    Pack years: 7.50    Types: Cigarettes   Smokeless tobacco: Never  Vaping Use   Vaping Use: Never used  Substance Use Topics   Alcohol use: Yes    Alcohol/week: 12.0 standard drinks    Types: 12 Cans of beer per week   Drug use: Yes    Types: Marijuana    Allergies  Allergen Reactions   Nsaids     History of perforated ulcer    Current Facility-Administered Medications  Medication Dose Route Frequency Provider Last Rate Last Admin   0.9 %  sodium chloride infusion   Intravenous Continuous Timothy Costa, MD       acetaminophen (TYLENOL) suppository 650 mg  650 mg Rectal Q6H PRN Timothy Costa, MD       albuterol (PROVENTIL) (2.5 MG/3ML) 0.083% nebulizer solution 2.5 mg  2.5 mg Nebulization Q4H PRN Timothy Costa, MD       folic acid 1 mg in sodium chloride 0.9 % 50 mL IVPB  1 mg Intravenous Daily Timothy Costa, MD       LORazepam (ATIVAN) tablet 1-4 mg  1-4  mg Oral Q1H PRN Timothy Costa, MD       Or   LORazepam (ATIVAN) injection 1-4 mg  1-4 mg Intravenous Q1H PRN Timothy Costa, MD       magnesium sulfate IVPB 2 g 50 mL  2 g Intravenous Once Timothy Starch, MD 50 mL/hr at 05/19/2021 1454 2 g at 06/03/2021 1454   morphine 2 MG/ML injection 1 mg  1 mg Intravenous Q4H PRN Timothy Costa, MD       nicotine (NICODERM CQ - dosed in mg/24 hours) patch 21 mg  21 mg Transdermal Daily Timothy Costa, MD   21 mg at 06/05/2021 1441   ondansetron (ZOFRAN) injection 4 mg  4 mg Intravenous Q8H PRN Timothy Costa, MD       Timothy Shaffer ON 05/27/2021] pantoprazole (PROTONIX) injection 40 mg  40 mg Intravenous Q12H Timothy Costa, MD       pantoprozole (PROTONIX) 80 mg /NS 100 mL infusion  8 mg/hr Intravenous Continuous Timothy Costa, MD 10 mL/hr at 05/16/2021 1448 8 mg/hr at 06/05/2021 1448   potassium chloride 10 mEq in 100 mL IVPB  10 mEq Intravenous Q1 Hr x 4  Timothy Costa, MD       thiamine (B-1) injection 100 mg  100 mg Intravenous Daily Timothy Costa, MD       Current Outpatient Medications  Medication Sig Dispense Refill   albuterol (VENTOLIN HFA) 108 (90 Base) MCG/ACT inhaler Inhale 1-2 puffs into the lungs every 6 (six) hours as needed for wheezing or shortness of breath.     budesonide-formoterol (SYMBICORT) 80-4.5 MCG/ACT inhaler 2 puffs (Patient not taking: Reported on 05/30/2021)       Review of Systems Full ROS  was asked and was negative except for the information on the HPI  Physical Exam Blood pressure 117/82, pulse 86, temperature 98.4 F (36.9 C), temperature source Oral, resp. rate 18, height 5\' 11"  (1.803 m), weight 56.7 kg, SpO2 99 %. CONSTITUTIONAL: NAD EYES: Pupils are equal, round,  Sclera are non-icteric. EARS, NOSE, MOUTH AND THROAT: He is wearing mask. Hearing is intact to voice. LYMPH NODES:  Lymph nodes in the neck are normal. RESPIRATORY:  Lungs are clear. There is normal respiratory effort, with equal breath sounds bilaterally, and without pathologic use of accessory muscles. CARDIOVASCULAR: Heart is regular without murmurs, gallops, or rubs. GI: The abdomen is  soft, nontender, and nondistended. Midline laparotomy scar.  No peritonitis there are no palpable masses. There is no hepatosplenomegaly. There are normal bowel sounds in all quadrants. GU: Rectal deferred.   MUSCULOSKELETAL: Normal muscle strength and tone. No cyanosis or edema.   SKIN: Turgor is good and there are no pathologic skin lesions or ulcers. NEUROLOGIC: Motor and sensation is grossly normal. Cranial nerves are grossly intact. PSYCH:  Oriented to person, place and time. Affect is normal.  Data Reviewed  I have personally reviewed the patient's imaging, laboratory findings and medical records.    Assessment/Plan 70 year old male with GI bleed consistent with bleeding peptic ulcer.  Seems to be a contained perforation as well.  He is not peritoneal  leak is not toxic.  We will obtain a contrast study of the stomach to rule out any free perforation.  Discussed case in detail with radiologist Dr. Posey Shaffer. Keep patient n.p.o. place him on a PPI drip and perform serial abdominal exams.  Currently no need for emergent surgical intervention at this time. We Will continue to follow him  Caroleen Hamman, MD FACS General Surgeon 05/16/2021, 3:28  PM   

## 2021-05-23 NOTE — Telephone Encounter (Signed)
Patient scheduled for 06/03/21 and was contacted by scheduling

## 2021-05-23 NOTE — ED Notes (Signed)
Pt presents to ED with c/o of having 2-3 bouts of black tarry and bright red bloody stools. Pt states HX of polyps and resection of bowel. Pt states they also found a mass and there is discussion of doing a biopsy of the mass they found. Pt denies pain at this time. Pt states no recent illnesses. Pt denies N/V/D.   Pt states he still uses ibuprofen although he has an allergy noted of a perforated, pt educated that this is considered a "NSAID", pt verbalized understanding. Pt is A&Ox4. Pt denies hematuria.

## 2021-05-23 NOTE — Consult Note (Signed)
Timothy Darby, MD [address]  [place]  [place], [place] [address]  Main: [phone]  Fax: [phone] Pager: [phone]   Consultation  Referring Provider:     No ref. provider found Primary Care Physician:  Timothy Dials, MD Primary Gastroenterologist: [facility] gastroenterology      Reason for Consultation:     Melena, hematochezia  Date of Admission:  05/31/2021 Date of Consultation:  05/15/2021         HPI:   Timothy Shaffer is a 70 y.o. male with history of tobacco use, history of peptic ulcer disease, perforated duodenal ulcer s/p surgery in 1993, history of right hemicolectomy secondary to large unresectable colon polyp in 07/2020 who presented with 1 day history of black tarry stool, 2 to 3 days history of feeling tight in the upper abdomen.  Patient reports that he noticed black tarry stool this morning followed by dark maroon stools x2 episodes.  He denies abdominal distention, nausea or vomiting.  He reports that he has been taking ibuprofen about 3 pills daily, about 3 times a week for musculoskeletal pain.  Patient was hemodynamically stable in the ER, labs revealed mild leukocytosis, hemoglobin 11.1, at baseline compared to 07/2020.  MCV 106.7, normal platelets, he did have elevated BUN/creatinine ratio 28/0.88.  No evidence of chronic liver disease.  Patient is kept n.p.o., started on pantoprazole drip and GI is consulted for further evaluation.  In the meantime, patient underwent CT angio abdomen and pelvis which revealed a contained perforation in the gastric antrum concerning for perforated gastric ulcer/mass.  General surgery is also consulted and patient is undergoing upper GI series   NSAIDs: Ibuprofen 3 pills 2-3 times weekly for musculoskeletal pain  Antiplts/Anticoagulants/Anti thrombotics: None  GI Procedures: Colonoscopy in 04/2020 at [facility] by [facility] gastroenterologist  Past Medical History:  Diagnosis Date  . AAA (abdominal aortic  aneurysm) ([place])   . Asthma    allergies  . Complication of anesthesia   . Family history of adverse reaction to anesthesia    PONV  . Perforated duodenal ulcer ([place]) 05/21/1992   Emergency surgery at [facility], [place], [place]  . PONV (postoperative nausea and vomiting)   . PUD (peptic ulcer disease)     Past Surgical History:  Procedure Laterality Date  . COLONOSCOPY  04/2020  . MASS EXCISION Left 04/24/2014   Procedure: LEFT LONG & RING FINGERS DISTAL INTERPHALANGEAL JOINT/CYST EXCISION;  Surgeon: Cammie Sickle., MD;  Location: [place];  Service: Orthopedics;  Laterality: Left;  . REPAIR OF PERFORATED ULCER  1993   [facility], [place]    Prior to Admission medications   Medication Sig Start Date End Date Taking? Authorizing Provider  albuterol (VENTOLIN HFA) 108 (90 Base) MCG/ACT inhaler Inhale 1-2 puffs into the lungs every 6 (six) hours as needed for wheezing or shortness of breath.   Yes [provider]  budesonide-formoterol (SYMBICORT) 80-4.5 MCG/ACT inhaler 2 puffs Patient not taking: Reported on 05/15/2021 02/19/21   [provider]   Current Facility-Administered Medications:  .  0.9 %  sodium chloride infusion, , Intravenous, Continuous, Ivor Costa, MD .  acetaminophen (TYLENOL) suppository 650 mg, 650 mg, Rectal, Q6H PRN, Ivor Costa, MD .  albuterol (PROVENTIL) (2.5 MG/3ML) 0.083% nebulizer solution 2.5 mg, 2.5 mg, Nebulization, Q4H PRN, Ivor Costa, MD .  folic acid 1 mg in sodium chloride 0.9 % 50 mL IVPB, 1 mg, Intravenous, Daily, Ivor Costa, MD .  LORazepam (ATIVAN)  tablet 1-4 mg, 1-4 mg, Oral, Q1H PRN **OR** LORazepam (ATIVAN) injection 1-4 mg, 1-4 mg, Intravenous, Q1H PRN, Ivor Costa, MD .  magnesium sulfate IVPB 2 g 50 mL, 2 g, Intravenous, Once, Lucrezia Starch, MD, Last Rate: 50 mL/hr at 05/19/2021 1454, 2 g at 06/04/2021 1454 .  morphine 2 MG/ML injection 1 mg, 1 mg, Intravenous, Q4H PRN, Ivor Costa, MD .  nicotine (NICODERM CQ - dosed in  mg/24 hours) patch 21 mg, 21 mg, Transdermal, Daily, Ivor Costa, MD, 21 mg at 05/15/2021 1441 .  ondansetron (ZOFRAN) injection 4 mg, 4 mg, Intravenous, Q8H PRN, Ivor Costa, MD .  Derrill Memo ON 05/27/2021] pantoprazole (PROTONIX) injection 40 mg, 40 mg, Intravenous, Q12H, Ivor Costa, MD .  pantoprozole (PROTONIX) 80 mg /NS 100 mL infusion, 8 mg/hr, Intravenous, Continuous, Ivor Costa, MD, Last Rate: 10 mL/hr at 05/18/2021 1448, 8 mg/hr at 05/27/2021 1448 .  potassium chloride 10 mEq in 100 mL IVPB, 10 mEq, Intravenous, Q1 Hr x 4, Niu, Xilin, MD .  thiamine (B-1) injection 100 mg, 100 mg, Intravenous, Daily, Ivor Costa, MD  Current Outpatient Medications:  .  albuterol (VENTOLIN HFA) 108 (90 Base) MCG/ACT inhaler, Inhale 1-2 puffs into the lungs every 6 (six) hours as needed for wheezing or shortness of breath., Disp: , Rfl:  .  budesonide-formoterol (SYMBICORT) 80-4.5 MCG/ACT inhaler, 2 puffs (Patient not taking: Reported on 05/16/2021), Disp: , Rfl:    Family History  Problem Relation Age of Onset  . Gallbladder disease Sister   . Breast cancer Daughter      Social History   Tobacco Use  . Smoking status: Every Day    Packs/day: 0.50    Years: 15.00    Pack years: 7.50    Types: Cigarettes  . Smokeless tobacco: Never  Vaping Use  . Vaping Use: Never used  Substance Use Topics  . Alcohol use: Yes    Alcohol/week: 12.0 standard drinks    Types: 12 Cans of beer per week  . Drug use: Yes    Types: Marijuana    Allergies as of 05/19/2021 - Review Complete 05/31/2021  Allergen Reaction Noted  . Nsaids  05/21/2020    Review of Systems:    All systems reviewed and negative except where noted in HPI.   Physical Exam:  Vital signs in last 24 hours: Temp:  [98.4 F (36.9 C)] 98.4 F (36.9 C) (06/17 1230) Pulse Rate:  [86-118] 86 (06/17 1415) Resp:  [18-23] 18 (06/17 1415) BP: (113-117)/(71-82) 117/82 (06/17 1415) SpO2:  [99 %-100 %] 99 % (06/17 1415) Weight:  [56.7 kg] 56.7 kg (06/17  1045)   General:   Pleasant, cooperative in NAD Head:  Normocephalic and atraumatic. Eyes:   No icterus.   Conjunctiva pink. PERRLA. Ears:  Normal auditory acuity. Neck:  Supple; no masses or thyroidomegaly Lungs: Respirations even and unlabored. Lungs clear to auscultation bilaterally.   No wheezes, crackles, or rhonchi.  Heart:  Regular rate and rhythm;  Without murmur, clicks, rubs or gallops Abdomen:  Soft, nondistended, nontender. Normal bowel sounds. No appreciable masses or hepatomegaly.  No rebound or guarding.  Rectal:  Not performed. Msk:  Symmetrical without gross deformities.  Strength normal Extremities:  Without edema, cyanosis or clubbing. Neurologic:  Alert and oriented x3;  grossly normal neurologically. Skin:  Intact without significant lesions or rashes. Cervical Nodes:  No significant cervical adenopathy. Psych:  Alert and cooperative. Normal affect.  LAB RESULTS: CBC Latest Ref Rng & Units 06/04/2021 07/24/2020  07/23/2020  WBC 4.0 - 10.5 K/uL 14.5(H) 14.5(H) 16.5(H)  Hemoglobin 13.0 - 17.0 g/dL 11.1(L) 11.0(L) 10.8(L)  Hematocrit 39.0 - 52.0 % 31.8(L) 33.0(L) 32.4(L)  Platelets 150 - 400 K/uL 184 285 224    BMET BMP Latest Ref Rng & Units 05/17/2021 07/24/2020 07/23/2020  Glucose 70 - 99 mg/dL 174(H) - -  BUN 8 - 23 mg/dL 28(H) - -  Creatinine 0.61 - 1.24 mg/dL 0.88 0.77 0.64  Sodium 135 - 145 mmol/L 131(L) - -  Potassium 3.5 - 5.1 mmol/L 3.1(L) 3.5 4.1  Chloride 98 - 111 mmol/L 100 - -  CO2 22 - 32 mmol/L 24 - -  Calcium 8.9 - 10.3 mg/dL 8.2(L) - -    LFT Hepatic Function Latest Ref Rng & Units 05/22/2021 07/20/2020 07/19/2020  Total Protein 6.5 - 8.1 g/dL 5.2(L) 4.7(L) 4.6(L)  Albumin 3.5 - 5.0 g/dL 3.2(L) 2.4(L) 2.5(L)  AST 15 - 41 U/L 32 21 22  ALT 0 - 44 U/L '24 16 16  ' Alk Phosphatase 38 - 126 U/L 59 41 38  Total Bilirubin 0.3 - 1.2 mg/dL 1.3(H) 0.8 0.4     STUDIES: CT Angio Abd/Pel W and/or Wo Contrast  Result Date: 05/09/2021 CLINICAL DATA:   70 year old male with a history of GI bleeding EXAM: CTA ABDOMEN AND PELVIS WITHOUT AND WITH CONTRAST TECHNIQUE: Multidetector CT imaging of the abdomen and pelvis was performed using the standard protocol during bolus administration of intravenous contrast. Multiplanar reconstructed images and MIPs were obtained and reviewed to evaluate the vascular anatomy. CONTRAST:  146m OMNIPAQUE IOHEXOL 350 MG/ML SOLN COMPARISON:  03/12/2021, 07/20/2020 FINDINGS: VASCULAR Aorta: Diameter of the aorta at the hiatus measures 2.6 cm. Mild atherosclerotic changes of the abdominal aorta. No ulcerated plaque or pedunculated plaque. No dissection. Juxtarenal aorta measures 2.3 cm. Largest diameter of the infrarenal abdominal aorta measures 2.4 cm. Celiac: Celiac artery is patent with mild atherosclerotic changes. Stenosis of the left gastric artery origin. SMA: Moderate atherosclerotic changes at the SMA origin including soft plaque which narrows the lumen beyond the origin by estimated 50%. Branches are patent. Renals: - Right: Mild to moderate atherosclerotic changes at the right renal artery origin without evidence of high-grade stenosis. - Left: Mild to moderate atherosclerotic changes of the left renal artery origin without high-grade stenosis. IMA: IMA is patent Right lower extremity: Unremarkable course, caliber, and contour of the right iliac system. Moderate atherosclerotic changes of the right iliac system without high-grade stenosis or occlusion. No aneurysm, dissection, or occlusion. Hypogastric artery is patent, with atherosclerotic changes. Common femoral artery patent. Proximal SFA and profunda femoris patent. Left lower extremity: Unremarkable course, caliber, and contour of the left iliac system. Moderate atherosclerotic changes of the left iliac system without high-grade stenosis or occlusion. No aneurysm, dissection, or occlusion. Hypogastric artery is patent, with atherosclerotic changes. Common femoral artery  patent. Proximal SFA and profunda femoris patent. Veins: Unremarkable appearance of the venous system. Review of the MIP images confirms the above findings. NON-VASCULAR Lower chest: No acute. Hepatobiliary: Diffusely decreased attenuation of liver parenchyma no focal lesions identified. Focal fatty sparing within lower segment 3. Gallbladder is relatively decompressed. Pancreas: Unremarkable. Spleen: Unremarkable. Adrenals/Urinary Tract: - Right adrenal gland: Unremarkable - Left adrenal gland: Unremarkable. - Right kidney: No hydronephrosis, nephrolithiasis, inflammation, or ureteral dilation. No focal lesion. - Left Kidney: No hydronephrosis, nephrolithiasis, inflammation, or ureteral dilation. Interpolar mass of the lateral left renal cortex estimated 2.9 cm, similar to the prior CT and concerning for renal cell carcinoma. -  Urinary Bladder: Urinary bladder is relatively decompressed. Stomach/Bowel: - Stomach: Surgical changes along the lesser curvature of the stomach. Inflammatory changes in the region of the gastric antrum and pylorus, with decompression of the stomach lumen. There is gas at the posterior aspect of the stomach antrum, separate from the lumen of the stomach in 3 locations, image 66, image 75, and image 71 of series 5. There are enhancing small nodules within the gastrocolic ligament, image 81 of series 5 and image 33 of series 7. Edema within the associated fat. There is a borderline enlarged lymph node measuring 8 mm-9 mm in the pre duodenal region, new from the comparison CT. - Small bowel: Unremarkable. No accumulation of contrast or air-fluid levels. - Appendix: Appendix is not visualized, however, no inflammatory changes are present adjacent to the cecum to indicate an appendicitis. - Colon: Surgical changes of the left colon. No evidence of obstruction. No focal inflammatory changes. No accumulation of contrast. No air-fluid levels. Lymphatic: No inguinal adenopathy. No pelvic adenopathy.  No periaortic adenopathy. Lymph node anterior to the duodenum, 8 mm-9 mm. There are small nodules in the gastrocolic ligament, as described above. Small nodules in the gastrohepatic ligament. Mesenteric: No significant free fluid. Reproductive: Transverse diameter of the prostate 37 mm. Other: No hernia. Musculoskeletal: No evidence of acute fracture. No bony canal narrowing. Degenerative changes of the spine. Degenerative changes of the hips. No aggressive lytic or sclerotic lesions identified. IMPRESSION: CT angiogram is negative for evidence for acute gastrointestinal hemorrhage. Findings of contained perforation at the gastric antrum, concerning for perforated gastric cancer, or alternatively gastric ulcer. There are suspicious small soft tissue implants/nodules within the gastrocolic ligament, gastrohepatic ligament, as well as a borderline pre duodenal lymph node. Surgical evaluation is indicated. These above preliminary results were discussed by telephone at the time of interpretation on 06/01/2021 at 2:40 pm with Dr. Tamala Julian. 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 As previously mentioned, MRI may be useful for further evaluation. Aortic atherosclerosis and mesenteric arterial disease, without evidence of high-grade stenosis. Aortic Atherosclerosis (ICD10-I70.0). Bilateral renal arterial disease without high-grade stenosis. Additional ancillary findings as above. Signed, Dulcy Fanny. Dellia Nims, RPVI [facility] Electronically Signed   By: Corrie Mckusick D.O.   On: 05/09/2021 14:49      Impression / Plan:   Timothy Shaffer is a 70 y.o. male with history of tobacco use, alcohol use, perforated duodenal ulcer s/p surgery in 1993, right hemicolectomy secondary to large unresectable colon polyp in 8/21, chronic NSAID use presented with upper abdominal discomfort and melena  Upper abdominal discomfort and melena Patient has elevated BUN  to creatinine ratio which indicates upper GI bleed CT angio abdomen and pelvis revealed a contained perforation in the gastric antrum, likely secondary to peptic ulcer disease Strict n.p.o. Continue pantoprazole drip Maintenance IV fluids Recommend empiric antibiotic coverage with Zosyn General surgery is on board, patient is undergoing upper GI series Upper endoscopy is contraindicated in the setting of contained perforation Monitor CBC closely, maintain hemoglobin above 7  Thank you for involving me in the care of this patient.  GI will follow along with you    LOS: 0 days   Sherri Sear, MD  06/04/2021, 3:28 PM    Note: This dictation was prepared with Dragon dictation along with smaller phrase technology. Any transcriptional errors that result from this process are unintentional.

## 2021-05-24 ENCOUNTER — Other Ambulatory Visit: Payer: Medicare HMO

## 2021-05-24 ENCOUNTER — Inpatient Hospital Stay: Payer: Medicare HMO | Admitting: Certified Registered"

## 2021-05-24 ENCOUNTER — Inpatient Hospital Stay: Payer: Medicare HMO

## 2021-05-24 ENCOUNTER — Encounter: Admission: EM | Disposition: E | Payer: Self-pay | Source: Home / Self Care | Attending: Pulmonary Disease

## 2021-05-24 ENCOUNTER — Encounter: Payer: Self-pay | Admitting: Internal Medicine

## 2021-05-24 DIAGNOSIS — K921 Melena: Secondary | ICD-10-CM

## 2021-05-24 HISTORY — PX: ESOPHAGOGASTRODUODENOSCOPY (EGD) WITH PROPOFOL: SHX5813

## 2021-05-24 HISTORY — PX: COLONOSCOPY WITH PROPOFOL: SHX5780

## 2021-05-24 LAB — BASIC METABOLIC PANEL
Anion gap: 5 (ref 5–15)
BUN: 16 mg/dL (ref 8–23)
CO2: 24 mmol/L (ref 22–32)
Calcium: 7.8 mg/dL — ABNORMAL LOW (ref 8.9–10.3)
Chloride: 107 mmol/L (ref 98–111)
Creatinine, Ser: 0.71 mg/dL (ref 0.61–1.24)
GFR, Estimated: >60 mL/min (ref >60)
Glucose, Bld: 77 mg/dL (ref 70–99)
Potassium: 3.8 mmol/L (ref 3.5–5.1)
Sodium: 136 mmol/L (ref 135–145)

## 2021-05-24 LAB — CBC
HCT: 25.5 % — ABNORMAL LOW (ref 39.0–52.0)
HCT: 24.9 % — ABNORMAL LOW (ref 39.0–52.0)
HCT: 27.2 % — ABNORMAL LOW (ref 39.0–52.0)
Hemoglobin: 8.8 g/dL — ABNORMAL LOW (ref 13.0–17.0)
Hemoglobin: 8.6 g/dL — ABNORMAL LOW (ref 13.0–17.0)
Hemoglobin: 9.5 g/dL — ABNORMAL LOW (ref 13.0–17.0)
MCH: 37.3 pg — ABNORMAL HIGH (ref 26.0–34.0)
MCH: 37.7 pg — ABNORMAL HIGH (ref 26.0–34.0)
MCH: 35.3 pg — ABNORMAL HIGH (ref 26.0–34.0)
MCHC: 34.5 g/dL (ref 30.0–36.0)
MCHC: 34.5 g/dL (ref 30.0–36.0)
MCHC: 34.9 g/dL (ref 30.0–36.0)
MCV: 108.1 fL — ABNORMAL HIGH (ref 80.0–100.0)
MCV: 109.2 fL — ABNORMAL HIGH (ref 80.0–100.0)
MCV: 101.1 fL — ABNORMAL HIGH (ref 80.0–100.0)
Platelets: 149 10*3/uL — ABNORMAL LOW (ref 150–400)
Platelets: 145 10*3/uL — ABNORMAL LOW (ref 150–400)
Platelets: 133 10*3/uL — ABNORMAL LOW (ref 150–400)
RBC: 2.36 MIL/uL — ABNORMAL LOW (ref 4.22–5.81)
RBC: 2.28 MIL/uL — ABNORMAL LOW (ref 4.22–5.81)
RBC: 2.69 MIL/uL — ABNORMAL LOW (ref 4.22–5.81)
RDW: 13.1 % (ref 11.5–15.5)
RDW: 13.2 % (ref 11.5–15.5)
RDW: 17.2 % — ABNORMAL HIGH (ref 11.5–15.5)
WBC: 10.1 10*3/uL (ref 4.0–10.5)
WBC: 9.8 10*3/uL (ref 4.0–10.5)
WBC: 13 10*3/uL — ABNORMAL HIGH (ref 4.0–10.5)
nRBC: 0 % (ref 0.0–0.2)
nRBC: 0 % (ref 0.0–0.2)
nRBC: 0 % (ref 0.0–0.2)

## 2021-05-24 LAB — APTT: aPTT: 28 seconds (ref 24–36)

## 2021-05-24 LAB — GLUCOSE, CAPILLARY
Glucose-Capillary: 93 mg/dL (ref 70–99)
Glucose-Capillary: 131 mg/dL — ABNORMAL HIGH (ref 70–99)

## 2021-05-24 LAB — HIV ANTIBODY (ROUTINE TESTING W REFLEX): HIV Screen 4th Generation wRfx: NONREACTIVE

## 2021-05-24 LAB — PREPARE RBC (CROSSMATCH)

## 2021-05-24 SURGERY — ESOPHAGOGASTRODUODENOSCOPY (EGD) WITH PROPOFOL
Anesthesia: General

## 2021-05-24 MED ORDER — SUCCINYLCHOLINE CHLORIDE 200 MG/10ML IV SOSY
PREFILLED_SYRINGE | INTRAVENOUS | Status: AC
  Filled 2021-05-24: qty 10

## 2021-05-24 MED ORDER — EPINEPHRINE PF 1 MG/ML IJ SOLN
INTRAMUSCULAR | Status: AC
  Filled 2021-05-24: qty 1

## 2021-05-24 MED ORDER — FENTANYL CITRATE (PF) 100 MCG/2ML IJ SOLN
INTRAMUSCULAR | Status: DC | PRN
  Administered 2021-05-24 (×2): 50 ug via INTRAVENOUS

## 2021-05-24 MED ORDER — SODIUM CHLORIDE 0.9% IV SOLUTION: Freq: Once | INTRAVENOUS | Status: AC

## 2021-05-24 MED ORDER — LORAZEPAM 2 MG/ML IJ SOLN
0.5000 mg | INTRAMUSCULAR | Status: DC | PRN
  Administered 2021-05-24: 0.5 mg via INTRAVENOUS
  Filled 2021-05-24: qty 1

## 2021-05-24 MED ORDER — PHENYLEPHRINE HCL (PRESSORS) 10 MG/ML IV SOLN
INTRAVENOUS | Status: DC | PRN
  Administered 2021-05-24: 100 ug via INTRAVENOUS

## 2021-05-24 MED ORDER — SUCCINYLCHOLINE CHLORIDE 20 MG/ML IJ SOLN
INTRAMUSCULAR | Status: DC | PRN
  Administered 2021-05-24: 100 mg via INTRAVENOUS

## 2021-05-24 MED ORDER — LORAZEPAM 2 MG/ML IJ SOLN
0.5000 mg | Freq: Once | INTRAMUSCULAR | Status: AC
  Administered 2021-05-24: 0.5 mg via INTRAVENOUS
  Filled 2021-05-24: qty 1

## 2021-05-24 MED ORDER — PROPOFOL 10 MG/ML IV BOLUS
INTRAVENOUS | Status: AC
  Filled 2021-05-24: qty 20

## 2021-05-24 MED ORDER — SODIUM CHLORIDE 0.9 % IV BOLUS
1000.0000 mL | Freq: Once | INTRAVENOUS | Status: AC
  Administered 2021-05-24: 1000 mL via INTRAVENOUS

## 2021-05-24 MED ORDER — LIDOCAINE HCL (PF) 2 % IJ SOLN
INTRAMUSCULAR | Status: AC
  Filled 2021-05-24: qty 5

## 2021-05-24 MED ORDER — DEXAMETHASONE SODIUM PHOSPHATE 10 MG/ML IJ SOLN
INTRAMUSCULAR | Status: AC
  Filled 2021-05-24: qty 1

## 2021-05-24 MED ORDER — PEG 3350-KCL-NA BICARB-NACL 420 G PO SOLR
4000.0000 mL | Freq: Once | ORAL | Status: AC
  Administered 2021-05-24: 4000 mL via ORAL
  Filled 2021-05-24: qty 4000

## 2021-05-24 MED ORDER — FENTANYL CITRATE (PF) 100 MCG/2ML IJ SOLN
INTRAMUSCULAR | Status: AC
  Filled 2021-05-24: qty 2

## 2021-05-24 MED ORDER — EPINEPHRINE 1 MG/10ML IJ SOSY
PREFILLED_SYRINGE | INTRAMUSCULAR | Status: AC
  Filled 2021-05-24: qty 10

## 2021-05-24 MED ORDER — IOHEXOL 350 MG/ML SOLN
100.0000 mL | Freq: Once | INTRAVENOUS | Status: AC | PRN
  Administered 2021-05-24: 100 mL via INTRAVENOUS

## 2021-05-24 MED ORDER — ONDANSETRON HCL 4 MG/2ML IJ SOLN
INTRAMUSCULAR | Status: AC
  Filled 2021-05-24: qty 2

## 2021-05-24 MED ORDER — PROPOFOL 10 MG/ML IV BOLUS
INTRAVENOUS | Status: DC | PRN
  Administered 2021-05-24: 100 mg via INTRAVENOUS

## 2021-05-24 MED ORDER — LIDOCAINE HCL (CARDIAC) PF 100 MG/5ML IV SOSY
PREFILLED_SYRINGE | INTRAVENOUS | Status: DC | PRN
  Administered 2021-05-24: 70 mg via INTRAVENOUS

## 2021-05-24 MED ORDER — ONDANSETRON HCL 4 MG/2ML IJ SOLN: 4.0000 mg | Freq: Once | INTRAMUSCULAR | Status: DC | PRN

## 2021-05-24 MED ORDER — ONDANSETRON HCL 4 MG/2ML IJ SOLN
INTRAMUSCULAR | Status: DC | PRN
  Administered 2021-05-24: 4 mg via INTRAVENOUS

## 2021-05-24 MED ORDER — DEXAMETHASONE SODIUM PHOSPHATE 10 MG/ML IJ SOLN
INTRAMUSCULAR | Status: DC | PRN
  Administered 2021-05-24: 4 mg via INTRAVENOUS

## 2021-05-24 MED ORDER — FENTANYL CITRATE (PF) 100 MCG/2ML IJ SOLN: 25.0000 ug | INTRAMUSCULAR | Status: DC | PRN

## 2021-05-24 MED ORDER — PANTOPRAZOLE 80MG IVPB - SIMPLE MED
80.0000 mg | Freq: Once | INTRAVENOUS | Status: AC
  Administered 2021-05-24: 80 mg via INTRAVENOUS
  Filled 2021-05-24: qty 80

## 2021-05-24 SURGICAL SUPPLY — 44 items
APPLIER CLIP 11 MED OPEN (CLIP)
APPLIER CLIP 13 LRG OPEN (CLIP)
BARRIER ADH SEPRAFILM 3INX5IN (MISCELLANEOUS) IMPLANT
BLADE CLIPPER SURG (BLADE) ×2 IMPLANT
BLADE SURG SZ10 CARB STEEL (BLADE) ×2 IMPLANT
CHLORAPREP W/TINT 26 (MISCELLANEOUS) ×2 IMPLANT
CLIP APPLIE 11 MED OPEN (CLIP) IMPLANT
CLIP APPLIE 13 LRG OPEN (CLIP) IMPLANT
COVER BACK TABLE REUSABLE LG (DRAPES) ×2 IMPLANT
COVER WAND RF STERILE (DRAPES) ×2 IMPLANT
DRAPE LAPAROTOMY 100X77 ABD (DRAPES) ×2 IMPLANT
DRSG OPSITE POSTOP 4X10 (GAUZE/BANDAGES/DRESSINGS) IMPLANT
DRSG OPSITE POSTOP 4X12 (GAUZE/BANDAGES/DRESSINGS) IMPLANT
ELECT BLADE 6.5 EXT (BLADE) ×2 IMPLANT
ELECT REM PT RETURN 9FT ADLT (ELECTROSURGICAL) ×2
ELECTRODE REM PT RTRN 9FT ADLT (ELECTROSURGICAL) ×1 IMPLANT
GLOVE SURG ENC MOIS LTX SZ7 (GLOVE) ×2 IMPLANT
GOWN STRL REUS W/ TWL LRG LVL3 (GOWN DISPOSABLE) ×2 IMPLANT
GOWN STRL REUS W/TWL LRG LVL3 (GOWN DISPOSABLE) ×2
HANDLE SUCTION POOLE (INSTRUMENTS) ×1 IMPLANT
HANDLE YANKAUER SUCT BULB TIP (MISCELLANEOUS) ×2 IMPLANT
LIGASURE IMPACT 36 18CM CVD LR (INSTRUMENTS) IMPLANT
MANIFOLD NEPTUNE II (INSTRUMENTS) ×2 IMPLANT
NEEDLE HYPO 22GX1.5 SAFETY (NEEDLE) ×4 IMPLANT
PACK BASIN MAJOR ARMC (MISCELLANEOUS) ×2 IMPLANT
RELOAD PROXIMATE 75MM BLUE (ENDOMECHANICALS) IMPLANT
SPONGE LAP 18X18 RF (DISPOSABLE) ×2 IMPLANT
SPONGE LAP 18X36 RFD (DISPOSABLE) IMPLANT
STAPLER PROXIMATE 75MM BLUE (STAPLE) IMPLANT
STAPLER SKIN PROX 35W (STAPLE) ×2 IMPLANT
SUCTION POOLE HANDLE (INSTRUMENTS) ×2
SUT PDS AB 0 CT1 27 (SUTURE) ×6 IMPLANT
SUT SILK 2 0 (SUTURE) ×1
SUT SILK 2 0 SH CR/8 (SUTURE) ×2 IMPLANT
SUT SILK 2 0SH CR/8 30 (SUTURE) ×2 IMPLANT
SUT SILK 2-0 18XBRD TIE 12 (SUTURE) ×1 IMPLANT
SUT VIC AB 0 CT1 36 (SUTURE) ×4 IMPLANT
SUT VIC AB 2-0 SH 27 (SUTURE) ×2
SUT VIC AB 2-0 SH 27XBRD (SUTURE) ×2 IMPLANT
SUT VIC AB 3-0 SH 27 (SUTURE) ×1
SUT VIC AB 3-0 SH 27X BRD (SUTURE) ×1 IMPLANT
SYR 30ML LL (SYRINGE) ×4 IMPLANT
SYR 3ML LL SCALE MARK (SYRINGE) ×2 IMPLANT
TRAY FOLEY MTR SLVR 16FR STAT (SET/KITS/TRAYS/PACK) ×2 IMPLANT

## 2021-05-24 NOTE — Anesthesia Procedure Notes (Signed)
Procedure Name: Intubation Date/Time: 05/14/2021 5:35 PM Performed by: Orion Crook, CRNA Pre-anesthesia Checklist: Patient identified, Emergency Drugs available, Suction available, Patient being monitored and Timeout performed Patient Re-evaluated:Patient Re-evaluated prior to induction Oxygen Delivery Method: Circle system utilized Preoxygenation: Pre-oxygenation with 100% oxygen Induction Type: Rapid sequence and IV induction Laryngoscope Size: Mac and 4 Grade View: Grade I Tube type: Oral Tube size: 7.5 mm Number of attempts: 1 Placement Confirmation: ETT inserted through vocal cords under direct vision, positive ETCO2 and breath sounds checked- equal and bilateral Secured at: 23 cm Tube secured with: Tape Dental Injury: Teeth and Oropharynx as per pre-operative assessment

## 2021-05-24 NOTE — Anesthesia Preprocedure Evaluation (Signed)
Anesthesia Evaluation  Patient identified by MRN, date of birth, ID band Patient awake    Reviewed: Allergy & Precautions, H&P , NPO status , Patient's Chart, lab work & pertinent test results, reviewed documented beta blocker date and time   History of Anesthesia Complications (+) PONV, Family history of anesthesia reaction and history of anesthetic complications  Airway Mallampati: II  TM Distance: >3 FB Neck ROM: full    Dental  (+) Dental Advidsory Given, Edentulous Upper, Edentulous Lower   Pulmonary neg shortness of breath, asthma , neg COPD, neg recent URI, Current Smoker,    Pulmonary exam normal breath sounds clear to auscultation       Cardiovascular Exercise Tolerance: Good (-) hypertension(-) angina+ Peripheral Vascular Disease  (-) Past MI and (-) Cardiac Stents Normal cardiovascular exam(-) dysrhythmias (-) Valvular Problems/Murmurs Rhythm:regular Rate:Normal     Neuro/Psych negative neurological ROS  negative psych ROS   GI/Hepatic Neg liver ROS, PUD,   Endo/Other  negative endocrine ROS  Renal/GU negative Renal ROS  negative genitourinary   Musculoskeletal   Abdominal   Peds  Hematology negative hematology ROS (+)   Anesthesia Other Findings Past Medical History: No date: AAA (abdominal aortic aneurysm) (HCC) No date: Asthma     Comment:  allergies No date: Complication of anesthesia No date: Family history of adverse reaction to anesthesia     Comment:  PONV 05/21/1992: Perforated duodenal ulcer ([place]utz)     Comment:  Emergency surgery at [facility], [place], [place] No date: PONV (postoperative nausea and vomiting) No date: PUD (peptic ulcer disease)  Known, contained perforated gastric ulcer.  Reproductive/Obstetrics negative OB ROS                             Anesthesia Physical Anesthesia Plan  ASA: 3  Anesthesia Plan: General   Post-op Pain Management:    Induction:  Intravenous  PONV Risk Score and Plan: 2 and Ondansetron, Dexamethasone and Treatment may vary due to age or medical condition  Airway Management Planned: Oral ETT  Additional Equipment:   Intra-op Plan:   Post-operative Plan: Extubation in OR and Possible Post-op intubation/ventilation  Informed Consent: I have reviewed the patients History and Physical, chart, labs and discussed the procedure including the risks, benefits and alternatives for the proposed anesthesia with the patient or authorized representative who has indicated his/her understanding and acceptance.     Dental Advisory Given  Plan Discussed with: Anesthesiologist, CRNA and Surgeon  Anesthesia Plan Comments:         Anesthesia Quick Evaluation

## 2021-05-24 NOTE — Op Note (Addendum)
[facility] Patient Name: Timothy Shaffer Procedure Date: 05/08/2021 6:11 PM MRN: [id number] Account #: [id number] Date of Birth: September 25, 1951 Admit Type: Inpatient Age: 70 Room: OR 8 Gender: Male Note Status: Finalized Procedure:             Colonoscopy Indications:           Hematochezia Providers:             Lin Landsman MD, MD Medicines:             General Anesthesia Complications:         No immediate complications. Estimated blood loss: None. Procedure:             Pre-Anesthesia Assessment:                        - Prior to the procedure, a History and Physical was                         performed, and patient medications and allergies were                         reviewed. The patient is competent. The risks and                         benefits of the procedure and the sedation options and                         risks were discussed with the patient. All questions                         were answered and informed consent was obtained.                         Patient identification and proposed procedure were                         verified by the physician, the nurse, the                         anesthesiologist, the anesthetist and the technician                         in the pre-procedure area in the procedure room in the                         endoscopy suite. Mental Status Examination: alert and                         oriented. Airway Examination: normal oropharyngeal                         airway and neck mobility. Respiratory Examination:                         clear to auscultation. CV Examination: normal.                         Prophylactic Antibiotics: The patient does not require  prophylactic antibiotics. Prior Anticoagulants: The                         patient has taken no previous anticoagulant or                         antiplatelet agents. ASA Grade Assessment: III - A                          patient with severe systemic disease. After reviewing                         the risks and benefits, the patient was deemed in                         satisfactory condition to undergo the procedure. The                         anesthesia plan was to use general anesthesia.                         Immediately prior to administration of medications,                         the patient was re-assessed for adequacy to receive                         sedatives. The heart rate, respiratory rate, oxygen                         saturations, blood pressure, adequacy of pulmonary                         ventilation, and response to care were monitored                         throughout the procedure. The physical status of the                         patient was re-assessed after the procedure.                        After obtaining informed consent, the colonoscope was                         passed under direct vision. Throughout the procedure,                         the patient's blood pressure, pulse, and oxygen                         saturations were monitored continuously. The                         Colonoscope was introduced through the anus and                         advanced to the the ileocolonic anastomosis. The  colonoscopy was performed without difficulty. The                         patient tolerated the procedure well. The quality of                         the bowel preparation was adequate. Findings:      The perianal and digital rectal examinations were normal. Pertinent       negatives include normal sphincter tone and no palpable rectal lesions.      There was evidence of a prior end-to-side ileo-colonic anastomosis in       the ascending colon. This was patent and was characterized by healthy       appearing mucosa. The anastomosis was traversed.      The neo-terminal ileum appeared normal.      The retroflexed view of the distal rectum and  anal verge was normal and       showed no anal or rectal abnormalities.      A 6 mm polyp was found in the descending colon. The polyp was sessile.       Polypectomy was not attempted due to hematochezia. Impression:            - Patent end-to-side ileo-colonic anastomosis,                         characterized by healthy appearing mucosa.                        - The examined portion of the ileum was normal.                        - The distal rectum and anal verge are normal on                         retroflexion view.                        - One 6 mm polyp in the descending colon. Resection                         not attempted.                        - No specimens collected. Recommendation:        - Return patient to hospital ward for ongoing care.                        - Clear liquid diet today.                        - Continue present medications.                        - No ibuprofen, naproxen, or other non-steroidal                         anti-inflammatory drugs. Procedure Code(s):     --- Professional ---                        [phone], Colonoscopy,  flexible; diagnostic, including                         collection of specimen(s) by brushing or washing, when                         performed (separate procedure) Diagnosis Code(s):     --- Professional ---                        Z98.0, Intestinal bypass and anastomosis status                        K63.5, Polyp of colon                        K92.1, Melena (includes Hematochezia) CPT copyright 2019 [employer]. All rights reserved. The codes documented in this report are preliminary and upon coder review may  be revised to meet current compliance requirements. Dr. Ulyess Mort Lin Landsman MD, MD 05/22/2021 6:21:54 PM Number of Addenda: 0 Note Initiated On: 05/29/2021 6:11 PM Scope Withdrawal Time: 0 hours 2 minutes 8 seconds  Total Procedure Duration: 0 hours 7 minutes 55 seconds  Estimated Blood  Loss:  Estimated blood loss: none.      [facility]

## 2021-05-24 NOTE — Progress Notes (Addendum)
Patient has finished ~3L of 4L bowel prep. Stools are dark red but otherwise liquid without any solid pieces. Bowel prep stopped, NPO at this time per Dr. Marius Ditch as patient will need to be NPO x2 hours for EGD/colonscopy

## 2021-05-24 NOTE — Progress Notes (Signed)
Approximately 0800 [name]. Assisted the patient with sitting on the edge of the bed. Patient stated that he felt dizzy. He then laid back on the bed and became unresponsive. RN Linna Hoff was called into the room and a rapid response was called at [place]. Patient was helped back into bed, and began bleeding from the rectum. Patient became responsive at approximately 0820. Patient was cleaned up and given warm blankets. IV fluids were administered and vitals were then taken at 15 minute intervals. 0855 blood pressure was 113/67. [name]

## 2021-05-24 NOTE — Progress Notes (Addendum)
Patient's SBP's remain 90's-100's. He has received total of 3L golytely prep, NPO since 1500. Tap water enema. Most recent BM is red water  small clots, but no stool observed. Handoff to OR nurse given. Patient currently running second unit of blood. Protonix gtt and NSS maintenance running. Zosyn was given to OR to decide if they will give- unable to run as patient kept losing IV access, 3rd IV was started shortly before transport. Message sent to pharmacy to reschedule future doses

## 2021-05-24 NOTE — Progress Notes (Signed)
Timothy Darby, MD [address]  [place]  [place], [place] [address]  Main: [phone]  Fax: [phone] Pager: [phone]   Subjective: Patient had a syncopal episode around 8 AM today after he had large bloody bowel movement, became unresponsive, blood pressure was 87/62, heart rate 115.  He received 2 L of IV fluids, Protonix bolus.  I was notified about this event, recommended stat CT angiogram of abdomen and pelvis.  General surgery has also been notified.  When I saw the patient this afternoon, he was receiving blood transfusion along with IV fluids and he did not have further episodes of rectal bleeding.  His wife is bedside.  Patient's hemoglobin dropped from 10.3-8.6 within last 24 hours   Objective: Vital signs in last 24 hours: Vitals:   05/18/2021 1248 05/07/2021 1301 06/01/2021 1316 05/22/2021 1331  BP: 96/68 102/79 106/70 115/89  Pulse: (!) 107 96 (!) 106 (!) 102  Resp:    16  Temp:    (!) 97.5 F (36.4 C)  TempSrc:      SpO2: 100% 100% 98% 100%  Weight:      Height:       Weight change:   Intake/Output Summary (Last 24 hours) at 05/08/2021 1418 Last data filed at 05/07/2021 1355 Gross per 24 hour  Intake 2640.47 ml  Output 0 ml  Net 2640.47 ml     Exam: Heart:: Tachycardia, regular rhythm Lungs: normal and clear to auscultation Abdomen: soft, nontender, normal bowel sounds   Lab Results: CBC Latest Ref Rng & Units 05/30/2021 05/27/2021 05/19/2021  WBC 4.0 - 10.5 K/uL 9.8 10.1 12.4(H)  Hemoglobin 13.0 - 17.0 g/dL 8.6(L) 8.8(L) 10.3(L)  Hematocrit 39.0 - 52.0 % 24.9(L) 25.5(L) 29.9(L)  Platelets 150 - 400 K/uL 145(L) 149(L) 176   CMP Latest Ref Rng & Units 05/14/2021 05/27/2021 07/24/2020  Glucose 70 - 99 mg/dL 77 174(H) -  BUN 8 - 23 mg/dL 16 28(H) -  Creatinine 0.61 - 1.24 mg/dL 0.71 0.88 0.77  Sodium 135 - 145 mmol/L 136 131(L) -  Potassium 3.5 - 5.1 mmol/L 3.8 3.1(L) 3.5  Chloride 98 - 111 mmol/L 107 100 -  CO2 22 - 32 mmol/L 24 24 -   Calcium 8.9 - 10.3 mg/dL 7.8(L) 8.2(L) -  Total Protein 6.5 - 8.1 g/dL - 5.2(L) -  Total Bilirubin 0.3 - 1.2 mg/dL - 1.3(H) -  Alkaline Phos 38 - 126 U/L - 59 -  AST 15 - 41 U/L - 32 -  ALT 0 - 44 U/L - 24 -    Micro Results: Recent Results (from the past 240 hour(s))  Resp Panel by RT-PCR (Flu A&B, Covid) Nasopharyngeal Swab     Status: None   Collection Time: 05/13/2021 10:59 AM   Specimen: Nasopharyngeal Swab; Nasopharyngeal(NP) swabs in vial transport medium  Result Value Ref Range Status   SARS Coronavirus 2 by RT PCR NEGATIVE NEGATIVE Final    Comment: (NOTE) SARS-CoV-2 target nucleic acids are NOT DETECTED.  The SARS-CoV-2 RNA is generally detectable in upper respiratory specimens during the acute phase of infection. The lowest concentration of SARS-CoV-2 viral copies this assay can detect is 138 copies/mL. A negative result does not preclude SARS-Cov-2 infection and should not be used as the sole basis for treatment or other patient management decisions. A negative result may occur with  improper specimen collection/handling, submission of specimen other than nasopharyngeal swab, presence of viral mutation(s) within the areas targeted by this assay, and inadequate  number of viral copies(<138 copies/mL). A negative result must be combined with clinical observations, patient history, and epidemiological information. The expected result is Negative.  Fact Sheet for Patients:  EntrepreneurPulse.com.au  Fact Sheet for Healthcare Providers:  IncredibleEmployment.be  This test is no t yet approved or cleared by the [place] FDA and  has been authorized for detection and/or diagnosis of SARS-CoV-2 by FDA under an Emergency Use Authorization (EUA). This EUA will remain  in effect (meaning this test can be used) for the duration of the COVID-19 declaration under Section 564(b)(1) of the Act, 21 U.S.C.section 360bbb-3(b)(1), unless the  authorization is terminated  or revoked sooner.       Influenza A by PCR NEGATIVE NEGATIVE Final   Influenza B by PCR NEGATIVE NEGATIVE Final    Comment: (NOTE) The Xpert Xpress SARS-CoV-2/FLU/RSV plus assay is intended as an aid in the diagnosis of influenza from Nasopharyngeal swab specimens and should not be used as a sole basis for treatment. Nasal washings and aspirates are unacceptable for Xpert Xpress SARS-CoV-2/FLU/RSV testing.  Fact Sheet for Patients: EntrepreneurPulse.com.au  Fact Sheet for Healthcare Providers: IncredibleEmployment.be  This test is not yet approved or cleared by the [place] FDA and has been authorized for detection and/or diagnosis of SARS-CoV-2 by FDA under an Emergency Use Authorization (EUA). This EUA will remain in effect (meaning this test can be used) for the duration of the COVID-19 declaration under Section 564(b)(1) of the Act, 21 U.S.C. section 360bbb-3(b)(1), unless the authorization is terminated or revoked.  Performed at [facility], [address] [place], [place] [address]   Culture, blood (x 2)     Status: None (Preliminary result)   Collection Time: 05/09/2021  4:42 PM   Specimen: BLOOD  Result Value Ref Range Status   Specimen Description BLOOD BLOOD RIGHT WRIST  Final   Special Requests   Final    BOTTLES DRAWN AEROBIC AND ANAEROBIC Blood Culture adequate volume   Culture   Final    NO GROWTH < 24 HOURS Performed at [facility], [address] [place], [place] [address]    Report Status PENDING  Incomplete  Culture, blood (x 2)     Status: None (Preliminary result)   Collection Time: 05/27/2021  4:53 PM   Specimen: BLOOD  Result Value Ref Range Status   Specimen Description BLOOD BLOOD LEFT HAND  Final   Special Requests   Final    BOTTLES DRAWN AEROBIC AND ANAEROBIC Blood Culture adequate volume   Culture   Final    NO GROWTH < 24 HOURS Performed at  [facility], [address] [place], [place] [address]    Report Status PENDING  Incomplete   Studies/Results: DG UGI W SINGLE CM (SOL OR THIN BA)  Result Date: 05/22/2021 CLINICAL DATA:  Evaluate gastric ulceration with possible perforation. EXAM: WATER SOLUBLE UPPER GI SERIES TECHNIQUE: Single-column upper GI series was performed using water soluble contrast. CONTRAST:  100 mL Omnipaque 300 orally COMPARISON:  CT abdomen 05/19/2021 FLUOROSCOPY TIME:  Fluoroscopy Time:  1 minutes 36 seconds Radiation Exposure Index (if provided by the fluoroscopic device): 35.1 mGy Number of Acquired Spot Images: 0 FINDINGS: Examination of the esophagus demonstrated normal esophageal motility. Normal esophageal morphology without evidence of esophagitis or ulceration. No esophageal stricture, diverticula, or mass lesion. No evidence of hiatal hernia. Mild gastroesophageal reflux. Mucosal thickening throughout the stomach as can be seen with gastritis. Focal outpouching of contrast along the superior anterior aspect of  distal antrum with adjacent mucosal irregularity most concerning for a contained perforated ulcer. No peritoneal spread of contrast. No gastric outlet obstruction. Duodenum is unremarkable. IMPRESSION: 1. Focal outpouching of contrast along the superior anterior aspect of distal antrum with adjacent mucosal irregularity most concerning for a contained perforated ulcer. No peritoneal spread of contrast. 2. Overall findings concerning for underlying gastritis. Electronically Signed   By: Kathreen Devoid   On: 05/19/2021 16:20   CT ANGIO GI BLEED  Result Date: 05/13/2021 CLINICAL DATA:  GI bleed EXAM: CTA ABDOMEN AND PELVIS WITHOUT AND WITH CONTRAST TECHNIQUE: Multidetector CT imaging of the abdomen and pelvis was performed using the standard protocol during bolus administration of intravenous contrast. Multiplanar reconstructed images and MIPs were obtained and reviewed to evaluate the vascular  anatomy. CONTRAST:  182mL OMNIPAQUE IOHEXOL 350 MG/ML SOLN COMPARISON:  the previous day's study FINDINGS: VASCULAR Aorta: Moderate calcified atheromatous plaque throughout. 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 No dissection or stenosis. Celiac: Patent without evidence of aneurysm, dissection, vasculitis or significant stenosis. SMA: Partially calcified ostial plaque resulting in 2 cm mild stenosis, patent distally with classic distal branch anatomy. Renals: Single left, heavily calcified proximal without high-grade stenosis. Single right, with irregular partially calcified ostial plaque extending over length of approximately 1.6 cm without high-grade stenosis, patent distally. IMA: Patent without evidence of aneurysm, dissection, vasculitis or significant stenosis. Inflow: Right common iliac artery fusiform dilatation up to 1.8 cm, left 1.7 cm. Moderate calcified atheromatous plaque throughout the iliac arterial systems bilaterally no dissection or stenosis. Proximal Outflow: Atheromatous, patent Veins: Patent hepatic veins, portal vein, SMV, splenic vein. Retroaortic left renal vein, an anatomic variant. Iliac venous system and IVC unremarkable. No venous pathology evident. Review of the MIP images confirms the above findings. NON-VASCULAR Lower chest: Visualized lung bases clear. No pleural or pericardial effusion. Hepatobiliary: Fatty liver without focal lesion. Gallbladder incompletely distended without calcified stones. Pancreas: Unremarkable. No pancreatic ductal dilatation or surrounding inflammatory changes. Spleen: Normal in size without focal abnormality. Adrenals/Urinary Tract: 3.2 cm enhancing hypervascular mid left renal mass containing coarse calcifications. No urolithiasis or hydronephrosis. Urinary bladder physiologically distended. Stomach/Bowel: Stomach is incompletely distended. There is mucosal hypoenhancement posteriorly in the antrum suggesting ulceration, with process  containing small gas bubbles extending adjacent to the GDA suggesting contained perforation. Small bowel is nondistended. Previous right hemicolectomy. Residual oral contrast material in the decompressed left colon with no convincing evidence of active extravasation. Lymphatic: Enlarged mesenteric node adjacent to the gastric process. No retroperitoneal or pelvic adenopathy. Reproductive: Prostate enlargement with central coarse calcifications. Other: Left pelvic phleboliths.  No ascites.  No free air. Musculoskeletal: Narrowing of the L4-5 interspace with circumferential disc bulge/protrusion. No fracture or worrisome bone lesion. IMPRESSION: 1. Negative for active extravasation into the GI tract. 2. Perforated process in the gastric antrum may represent peptic ulcer disease versus ulcerated neoplasm. This is in proximity to the [place], without pseudoaneurysm or other stigmata of bleeding. Critical Value/emergent results were called by telephone at the time of interpretation on 05/07/2021 at 11:21 am to provider Dr. Dahlia Byes, who verbally acknowledged these results. 3. 3.2 cm left renal mass probably renal cell carcinoma. Urology consult recommended. 4.  Aortic Atherosclerosis (ICD10-170.0). Electronically Signed   By: Lucrezia Europe M.D.   On: 05/17/2021 11:23   CT Angio Abd/Pel W and/or Wo Contrast  Result Date: 06/05/2021 CLINICAL DATA:  70 year old male with a history of GI bleeding EXAM: CTA ABDOMEN AND PELVIS WITHOUT AND WITH CONTRAST TECHNIQUE:  Multidetector CT imaging of the abdomen and pelvis was performed using the standard protocol during bolus administration of intravenous contrast. Multiplanar reconstructed images and MIPs were obtained and reviewed to evaluate the vascular anatomy. CONTRAST:  166mL OMNIPAQUE IOHEXOL 350 MG/ML SOLN COMPARISON:  03/12/2021, 07/20/2020 FINDINGS: VASCULAR Aorta: Diameter of the aorta at the hiatus measures 2.6 cm. Mild atherosclerotic changes of the abdominal aorta. No ulcerated  plaque or pedunculated plaque. No dissection. Juxtarenal aorta measures 2.3 cm. Largest diameter of the infrarenal abdominal aorta measures 2.4 cm. Celiac: Celiac artery is patent with mild atherosclerotic changes. Stenosis of the left gastric artery origin. SMA: Moderate atherosclerotic changes at the SMA origin including soft plaque which narrows the lumen beyond the origin by estimated 50%. Branches are patent. Renals: - Right: Mild to moderate atherosclerotic changes at the right renal artery origin without evidence of high-grade stenosis. - Left: Mild to moderate atherosclerotic changes of the left renal artery origin without high-grade stenosis. IMA: IMA is patent Right lower extremity: Unremarkable course, caliber, and contour of the right iliac system. Moderate atherosclerotic changes of the right iliac system without high-grade stenosis or occlusion. No aneurysm, dissection, or occlusion. Hypogastric artery is patent, with atherosclerotic changes. Common femoral artery patent. Proximal SFA and profunda femoris patent. Left lower extremity: Unremarkable course, caliber, and contour of the left iliac system. Moderate atherosclerotic changes of the left iliac system without high-grade stenosis or occlusion. No aneurysm, dissection, or occlusion. Hypogastric artery is patent, with atherosclerotic changes. Common femoral artery patent. Proximal SFA and profunda femoris patent. Veins: Unremarkable appearance of the venous system. Review of the MIP images confirms the above findings. NON-VASCULAR Lower chest: No acute. Hepatobiliary: Diffusely decreased attenuation of liver parenchyma no focal lesions identified. Focal fatty sparing within lower segment 3. Gallbladder is relatively decompressed. Pancreas: Unremarkable. Spleen: Unremarkable. Adrenals/Urinary Tract: - Right adrenal gland: Unremarkable - Left adrenal gland: Unremarkable. - Right kidney: No hydronephrosis, nephrolithiasis, inflammation, or ureteral  dilation. No focal lesion. - Left Kidney: No hydronephrosis, nephrolithiasis, inflammation, or ureteral dilation. Interpolar mass of the lateral left renal cortex estimated 2.9 cm, similar to the prior CT and concerning for renal cell carcinoma. - Urinary Bladder: Urinary bladder is relatively decompressed. Stomach/Bowel: - Stomach: Surgical changes along the lesser curvature of the stomach. Inflammatory changes in the region of the gastric antrum and pylorus, with decompression of the stomach lumen. There is gas at the posterior aspect of the stomach antrum, separate from the lumen of the stomach in 3 locations, image 66, image 75, and image 71 of series 5. There are enhancing small nodules within the gastrocolic ligament, image 81 of series 5 and image 33 of series 7. Edema within the associated fat. There is a borderline enlarged lymph node measuring 8 mm-9 mm in the pre duodenal region, new from the comparison CT. - Small bowel: Unremarkable. No accumulation of contrast or air-fluid levels. - Appendix: Appendix is not visualized, however, no inflammatory changes are present adjacent to the cecum to indicate an appendicitis. - Colon: Surgical changes of the left colon. No evidence of obstruction. No focal inflammatory changes. No accumulation of contrast. No air-fluid levels. Lymphatic: No inguinal adenopathy. No pelvic adenopathy. No periaortic adenopathy. Lymph node anterior to the duodenum, 8 mm-9 mm. There are small nodules in the gastrocolic ligament, as described above. Small nodules in the gastrohepatic ligament. Mesenteric: No significant free fluid. Reproductive: Transverse diameter of the prostate 37 mm. Other: No hernia. Musculoskeletal: No evidence of acute fracture. No bony canal narrowing.  Degenerative changes of the spine. Degenerative changes of the hips. No aggressive lytic or sclerotic lesions identified. IMPRESSION: CT angiogram is negative for evidence for acute gastrointestinal hemorrhage.  Findings of contained perforation at the gastric antrum, concerning for perforated gastric cancer, or alternatively gastric ulcer. There are suspicious small soft tissue implants/nodules within the gastrocolic ligament, gastrohepatic ligament, as well as a borderline pre duodenal lymph node. Surgical evaluation is indicated. These above preliminary results were discussed by telephone at the time of interpretation on 05/13/2021 at 2:40 pm with Dr. Tamala Julian. 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 As previously mentioned, MRI may be useful for further evaluation. Aortic atherosclerosis and mesenteric arterial disease, without evidence of high-grade stenosis. Aortic Atherosclerosis (ICD10-I70.0). Bilateral renal arterial disease without high-grade stenosis. Additional ancillary findings as above. Signed, Dulcy Fanny. Dellia Nims, RPVI [facility] Electronically Signed   By: Corrie Mckusick D.O.   On: 05/10/2021 14:49   Medications: I have reviewed the patient's current medications. Prior to Admission:  Medications Prior to Admission  Medication Sig Dispense Refill Last Dose   albuterol (VENTOLIN HFA) 108 (90 Base) MCG/ACT inhaler Inhale 1-2 puffs into the lungs every 6 (six) hours as needed for wheezing or shortness of breath.   prn at prn   budesonide-formoterol (SYMBICORT) 80-4.5 MCG/ACT inhaler 2 puffs (Patient not taking: Reported on 06/01/2021)   Not Taking   Scheduled:  mometasone-formoterol  2 puff Inhalation BID   nicotine  21 mg Transdermal Daily   [START ON 05/27/2021] pantoprazole  40 mg Intravenous Q12H   thiamine injection  100 mg Intravenous Daily   Continuous:  sodium chloride 130 mL/hr at 05/21/2021 1355   sodium chloride 250 mL (30/09/23 3007)   folic acid (FOLVITE) IVPB     pantoprazole 8 mg/hr (05/26/2021 0956)   piperacillin-tazobactam (ZOSYN)  IV 100 mL/hr at 06/04/2021 1355   MAU:[id number] chloride, acetaminophen,  albuterol, LORazepam **OR** LORazepam, morphine injection, ondansetron (ZOFRAN) IV Anti-infectives (From admission, onward)    Start     Dose/Rate Route Frequency Ordered Stop   05/21/2021 1545  piperacillin-tazobactam (ZOSYN) IVPB 3.375 g        3.375 g 12.5 mL/hr over 240 Minutes Intravenous Every 8 hours 06/05/2021 1531        Scheduled Meds:  mometasone-formoterol  2 puff Inhalation BID   nicotine  21 mg Transdermal Daily   [START ON 05/27/2021] pantoprazole  40 mg Intravenous Q12H   thiamine injection  100 mg Intravenous Daily   Continuous Infusions:  sodium chloride 130 mL/hr at 05/30/2021 1355   sodium chloride 250 mL (56/25/63 8937)   folic acid (FOLVITE) IVPB     pantoprazole 8 mg/hr (06/04/2021 0956)   piperacillin-tazobactam (ZOSYN)  IV 100 mL/hr at 05/11/2021 1355   PRN Meds:.sodium chloride, acetaminophen, albuterol, LORazepam **OR** LORazepam, morphine injection, ondansetron (ZOFRAN) IV   Assessment: Principal Problem:   GI bleeding Active Problems:   PUD (peptic ulcer disease)   Tobacco abuse   Moderate alcohol consumption   Asthma   Hypokalemia   Leukocytosis   Anemia due to blood loss   Left renal mass   Hypomagnesemia   Sepsis ([place])   Acute gastric perforation   Plan: Timothy Shaffer is a 70 y.o. male with history of tobacco use, alcohol use, perforated duodenal ulcer s/p surgery in 1993, right hemicolectomy secondary to large unresectable colon polyp in 8/21, chronic NSAID use presented with upper abdominal discomfort and melena  Melena/hematochezia: Resulting in syncope from  acute blood loss anemia Contained perforation in gastric antrum, closer to GDA based on the CT scan, likely source of GI bleed CT angio abdomen and pelvis did not reveal active extravasation of the blood Per request by general surgeon, Dr. Dahlia Byes, will proceed with upper endoscopy as well as colonoscopy before determining the need for surgery for repair of the peptic ulcer.  Surgery will  be on standby during the procedures Patient is currently undergoing rapid bowel prep in order to perform GI procedures today.  I have notified anesthesia, patient has to be n.p.o. for 2 hours after his bowel prep in order to proceed with GI procedures without intubation Maintain 2 large-bore Ivs Continue IV fluids and blood transfusion as needed Continue pantoprazole drip I have discussed my recommendations with both patient and his wife who are in agreement with the plan   LOS: 1 day   Patirica Longshore 05/14/2021, 2:18 PM

## 2021-05-24 NOTE — Significant Event (Addendum)
Rapid Response Event Note   Reason for Call : called RRT for GIB/ pt having large maroon stool, hypotensive.   Initial Focused Assessment: Laying in bed, alert and oriented x 4, see flowsheets for VS- MAP 65-70. NS Bolus infusing.      Interventions: Dr Posey Pronto to bedside, 2 units PRBC's ordered... will speak with GI and Surgery for input. Blood loss study ordered. Protonix bolus. Continue NS bolus. Transfer to PCU.   Plan of Care: as above.    Event Summary: Linna Hoff, RN and Franne Forts RN to call for further assistance.  MD Notified: Lindalou Hose Call Dresser End [id number]  Gwenevere Goga A, RN

## 2021-05-24 NOTE — Transfer of Care (Signed)
Immediate Anesthesia Transfer of Care Note  Patient: Timothy Shaffer  Procedure(s) Performed: ESOPHAGOGASTRODUODENOSCOPY (EGD) WITH PROPOFOL COLONOSCOPY WITH PROPOFOL  Patient Location: PACU  Anesthesia Type:General  Level of Consciousness: drowsy and patient cooperative  Airway & Oxygen Therapy: Patient Spontanous Breathing and Patient connected to nasal cannula oxygen  Post-op Assessment: Report given to RN  Post vital signs: Reviewed and stable  Last Vitals:  Vitals Value Taken Time  BP 110/72 06/04/2021 1827  Temp    Pulse    Resp    SpO2    Vitals shown include unvalidated device data.  Last Pain:  Vitals:   06/05/2021 1647  TempSrc: Oral  PainSc:          Complications: No notable events documented.

## 2021-05-24 NOTE — Progress Notes (Addendum)
I have personally assessed this patient and agree with assessment data collected by Vilma Prader, SN. I was present shortly after syncopal episode approximately 0800- patient had a very large amount of dark coagulated blood out of rectum. He was unresponsive for a few minutes until placed in Trendelenburg and sternal rub-regained consciousness and was alert and oriented x4. Hypotensive with BP 80/60. Patient has since been placed on telemetry, started on 2L O2/[place], received 2L of IV NS bolus, a protonix bolus, continues on protonix gtt. His BP has dramatically improved with SBP 100-1teens, HR in low 100's. He remains on bedrest due to medical instability and symptomatic hypotension. Blood transfusion has been initiated, will receive 2 units. Continues on q15 minute vital signs.

## 2021-05-24 NOTE — OR Nursing (Signed)
Skin tear noted on Right upper arm when turning patient on left side for Colonoscopy.

## 2021-05-24 NOTE — Anesthesia Postprocedure Evaluation (Signed)
Anesthesia Post Note  Patient: Timothy Shaffer  Procedure(s) Performed: ESOPHAGOGASTRODUODENOSCOPY (EGD) WITH PROPOFOL COLONOSCOPY WITH PROPOFOL  Patient location during evaluation: PACU Anesthesia Type: General Level of consciousness: awake and alert Pain management: pain level controlled Vital Signs Assessment: post-procedure vital signs reviewed and stable Respiratory status: spontaneous breathing, nonlabored ventilation, respiratory function stable and patient connected to nasal cannula oxygen Cardiovascular status: blood pressure returned to baseline and stable Postop Assessment: no apparent nausea or vomiting Anesthetic complications: no   No notable events documented.   Last Vitals:  Vitals:   05/20/2021 1830 05/26/2021 1900  BP: 111/71 (!) 136/91  Pulse:  82  Resp:    Temp:  36.6 C  SpO2: 100% 100%    Last Pain:  Vitals:   05/27/2021 1900  TempSrc: Oral  PainSc:                  Martha Clan

## 2021-05-24 NOTE — Progress Notes (Addendum)
[facility] Progress Note  Patient: Timothy Shaffer    CHY:[id number]  DOA: 06/01/2021     Date of Service: the patient was seen and examined on 05/31/2021  Brief hospital course: Past medical history of cecal polyp SP robotic colectomy 2021, perforated duodenal ulcer SP repair, active smoker, alcohol use, AAA, chronic mass suspicious for RCC, underweight.  Presents with complaints of bright red blood per rectum and melena. GI and general surgery were consulted. CT abdomen shows evidence of gastric perforation which was contained without any active bleeding. On 6/18 rapid response with hypotension and active bleeding.  SP 2 L bolus, 2 PRBC transfusion.  Will be going for urgent colonoscopy and depending on the results may require surgical repair of the acute bleeding Currently plan is monitor H&H and transfuse.  Assessment and Plan: 1.  Hemorrhagic shock due to GI bleeding Gastric perforation likely secondary to peptic ulcer disease Noncompliant with medical treatment, still using NSAIDs. Blood pressure improving after aggressive IV resuscitation and PRBC transfusion. SP 2 PRBC transfusion. Continue IV fluids. Continue PPI drip. Also received Protonix bolus again. GI enzymes with both involved in the care.  Appreciate their assistance. Patient underwent rapid bowel prep and will be going for endoscopic evaluation in OR. May require exploratory laparotomy if bleeding is not controlled. Currently transferring to progressive care unit. May require higher level of care/ICU postprocedure. Remains n.p.o. for now. Currently on IV Zosyn, will continue.  Follow-up on cultures. Given patient's high risk for decompensation with gastric perforation as well as GI bleed causing unresponsive events and hypotension.  Patient remains appropriate for transfer to progressive care unit.  Addendum: Underwent EGD colonoscopy. Colonoscopy did not show any acute area of bleeding. EGD showed large  duodenal bulb ulcer with visible blood vessel without any acute bleeding. Injected and clipped. Per GI patient can be monitored on the medical telemetry floor.  And does not require progressive care unit. Clear liquid diet.  Berle Mull 6:48 PM 05/29/2021    2.  Concern for sepsis-ruled out Patient does have perforated viscus and is currently on antibiotics although I do not think the patient is suffering from intra-abdominal infection which is causing hypotension and this most likely hemorrhagic shock. Monitor.  3.  Active smoker Nicotine patch.  4.  Anxiety This appears to be separate from alcohol withdrawal. For now we will have 0.5 mg Ativan every 4 hours as needed dose.  5.  Alcohol use disorder Currently on CIWA protocol. Monitor.  6.  Goals of care conversation. Extensive conversation with the patient regarding his goals of care. Currently wants to remain full code and wants full aggressive therapy. Patient also is okay with feeding tube for now.  7.  Underweight Placing the patient at high risk for poor outcome given his significant comorbidity and severity of his current presentation. Family and patient aware. Body mass index is 16.6 kg/m.   8.  Hypokalemia, hypomagnesemia. Replaced.  9.  Left renal mass. Patient has seen urology referral to interventional radiology for biopsy of the left renal mass and percutaneous ablation. Follow-up is currently scheduled on 06/03/2021. Not stable for now to consider further work-up inpatient.  Diet: N.p.o. DVT Prophylaxis:   SCDs Start: 06/01/2021 2113    Advance goals of care discussion: Full code  Family Communication: family was present at bedside, at the time of interview.  The pt provided permission to discuss medical plan with the family. Opportunity was given to ask question and all questions were answered satisfactorily.  Disposition:  Status is: Inpatient  Remains inpatient appropriate because:Hemodynamically  unstable, IV treatments appropriate due to intensity of illness or inability to take PO, and Inpatient level of care appropriate due to severity of illness  Dispo: The patient is from: Home              Anticipated d/c is to: Home              Patient currently is not medically stable to d/c.   Difficult to place patient No  Subjective: Rapid response was called this morning as the patient was unresponsive.  Blood pressure was 60.  Patient was placed in Trendelenburg position IV fluid bolus was initiated. 2 PRBC transfusion were also given after consent. No chest pain no abdominal pain.  No nausea no vomiting.  No shortness of breath.  No dizziness or lightheadedness. On repeat evaluation patient remains asymptomatic.  Blood pressures remain soft.  Physical Exam:  General: Appear in mild distress, no Rash; Oral Mucosa Clear, moist. no Abnormal Neck Mass Or lumps, Conjunctiva normal  Cardiovascular: S1 and S2 Present, no Murmur, Respiratory: good respiratory effort, Bilateral Air entry present and CTA, no Crackles, no wheezes Abdomen: Bowel Sound present, Soft and no tenderness Extremities: no Pedal edema Neurology: alert and oriented to time, place, and person affect appropriate. no new focal deficit Gait not checked due to patient safety concerns    Vitals:   05/15/2021 1449 06/05/2021 1538 05/14/2021 1628 06/03/2021 1647  BP: 109/80 109/74 99/70 104/73  Pulse: 95 87 82 82  Resp: (!) 22 18 20 20   Temp: (!) 97.2 F (36.2 C) 97.7 F (36.5 C) 98.5 F (36.9 C) 98 F (36.7 C)  TempSrc:  Oral Oral Oral  SpO2: 100% 100% 100%   Weight:      Height:        Intake/Output Summary (Last 24 hours) at 05/22/2021 1834 Last data filed at 06/03/2021 1827 Gross per 24 hour  Intake 3800.23 ml  Output 20 ml  Net 3780.23 ml   Filed Weights   05/10/2021 1045 05/21/2021 0444  Weight: 56.7 kg 54 kg    Data Reviewed: I have personally reviewed and interpreted daily labs, tele strips, imaging. I  reviewed all nursing notes, pharmacy notes, vitals, pertinent old records I have discussed plan of care as described above with RN and patient/family.  CBC: Recent Labs  Lab 06/05/2021 1048 05/26/2021 1642 06/05/2021 2003 05/10/2021 0045 05/12/2021 0625  WBC 14.5* 12.9* 12.4* 10.1 9.8  NEUTROABS 12.2*  --   --   --   --   HGB 11.1* 10.9* 10.3* 8.8* 8.6*  HCT 31.8* 31.3* 29.9* 25.5* 24.9*  MCV 106.7* 108.7* 107.6* 108.1* 109.2*  PLT 184 174 176 149* 662*   Basic Metabolic Panel: Recent Labs  Lab 05/12/2021 1048 06/05/2021 0625  NA 131* 136  K 3.1* 3.8  CL 100 107  CO2 24 24  GLUCOSE 174* 77  BUN 28* 16  CREATININE 0.88 0.71  CALCIUM 8.2* 7.8*  MG 1.4*  --   PHOS 3.2  --     Studies: CT ANGIO GI BLEED  Result Date: 05/17/2021 CLINICAL DATA:  GI bleed EXAM: CTA ABDOMEN AND PELVIS WITHOUT AND WITH CONTRAST TECHNIQUE: Multidetector CT imaging of the abdomen and pelvis was performed using the standard protocol during bolus administration of intravenous contrast. Multiplanar reconstructed images and MIPs were obtained and reviewed to evaluate the vascular anatomy. CONTRAST:  160mL OMNIPAQUE IOHEXOL 350 MG/ML SOLN COMPARISON:  the  previous day's study FINDINGS: VASCULAR Aorta: Moderate calcified atheromatous plaque throughout. 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 No dissection or stenosis. Celiac: Patent without evidence of aneurysm, dissection, vasculitis or significant stenosis. SMA: Partially calcified ostial plaque resulting in 2 cm mild stenosis, patent distally with classic distal branch anatomy. Renals: Single left, heavily calcified proximal without high-grade stenosis. Single right, with irregular partially calcified ostial plaque extending over length of approximately 1.6 cm without high-grade stenosis, patent distally. IMA: Patent without evidence of aneurysm, dissection, vasculitis or significant stenosis. Inflow: Right common iliac artery fusiform dilatation up to  1.8 cm, left 1.7 cm. Moderate calcified atheromatous plaque throughout the iliac arterial systems bilaterally no dissection or stenosis. Proximal Outflow: Atheromatous, patent Veins: Patent hepatic veins, portal vein, SMV, splenic vein. Retroaortic left renal vein, an anatomic variant. Iliac venous system and IVC unremarkable. No venous pathology evident. Review of the MIP images confirms the above findings. NON-VASCULAR Lower chest: Visualized lung bases clear. No pleural or pericardial effusion. Hepatobiliary: Fatty liver without focal lesion. Gallbladder incompletely distended without calcified stones. Pancreas: Unremarkable. No pancreatic ductal dilatation or surrounding inflammatory changes. Spleen: Normal in size without focal abnormality. Adrenals/Urinary Tract: 3.2 cm enhancing hypervascular mid left renal mass containing coarse calcifications. No urolithiasis or hydronephrosis. Urinary bladder physiologically distended. Stomach/Bowel: Stomach is incompletely distended. There is mucosal hypoenhancement posteriorly in the antrum suggesting ulceration, with process containing small gas bubbles extending adjacent to the GDA suggesting contained perforation. Small bowel is nondistended. Previous right hemicolectomy. Residual oral contrast material in the decompressed left colon with no convincing evidence of active extravasation. Lymphatic: Enlarged mesenteric node adjacent to the gastric process. No retroperitoneal or pelvic adenopathy. Reproductive: Prostate enlargement with central coarse calcifications. Other: Left pelvic phleboliths.  No ascites.  No free air. Musculoskeletal: Narrowing of the L4-5 interspace with circumferential disc bulge/protrusion. No fracture or worrisome bone lesion. IMPRESSION: 1. Negative for active extravasation into the GI tract. 2. Perforated process in the gastric antrum may represent peptic ulcer disease versus ulcerated neoplasm. This is in proximity to the [place], without  pseudoaneurysm or other stigmata of bleeding. Critical Value/emergent results were called by telephone at the time of interpretation on 05/27/2021 at 11:21 am to provider Dr. Dahlia Byes, who verbally acknowledged these results. 3. 3.2 cm left renal mass probably renal cell carcinoma. Urology consult recommended. 4.  Aortic Atherosclerosis (ICD10-170.0). Electronically Signed   By: Lucrezia Europe M.D.   On: 05/19/2021 11:23    Scheduled Meds:  EPINEPHrine       [MAR Hold] mometasone-formoterol  2 puff Inhalation BID   [MAR Hold] nicotine  21 mg Transdermal Daily   [MAR Hold] pantoprazole  40 mg Intravenous Q12H   [MAR Hold] thiamine injection  100 mg Intravenous Daily   Continuous Infusions:  sodium chloride 0 mL/hr at 05/26/2021 1523   [MAR Hold] sodium chloride 250 mL (05/18/2021 2200)   [MAR Hold] folic acid (FOLVITE) IVPB Stopped (05/10/2021 1720)   pantoprazole Stopped (05/10/2021 1720)   [MAR Hold] piperacillin-tazobactam (ZOSYN)  IV Stopped (05/31/2021 1708)   PRN Meds: [MAR Hold] sodium chloride, [MAR Hold] acetaminophen, [MAR Hold] albuterol, fentaNYL (SUBLIMAZE) injection, [MAR Hold] LORazepam, [MAR Hold] LORazepam **OR** [MAR Hold] LORazepam, [MAR Hold]  morphine injection, [MAR Hold] ondansetron (ZOFRAN) IV, ondansetron (ZOFRAN) IV  Time spent: 35 minutes  Author: Berle Mull, MD [facility] 05/16/2021 6:34 PM  To reach On-call, see care teams to locate the attending and reach out via [email or web address]. Between 7PM-7AM, please contact night-coverage If  you still have difficulty reaching the attending provider, please page the [facility] (Director on Call) for [facility] on amion for assistance.

## 2021-05-24 NOTE — Progress Notes (Signed)
Patient seen and examined.  He started rapid bowel prep.  He did have some blood with some of his bowel movements.  He is hemodynamics seem to be better after he got 2 units of blood. Did have soft blood pressures around noon and now his latest is 109/74.  He did went back to 84/68 at noon. Extensive discussion with him and his wife.  We will proceed with upper and lower scopes in the OR.  If Dr. Marius Ditch is unable to control the bleed he may need laparotomy possible gastrectomy.  Procedure discussed with the patient in detail.  Risks, benefits and possible complications including but not limited to: Bleeding, infection , anastomotic leaks, re interventions. They are fully aware of the seriousness of the situation

## 2021-05-24 NOTE — Progress Notes (Signed)
   05/15/2021 0821  Assess: MEWS Score  Temp (!) 97.5 F (36.4 C)  BP (!) 87/62  Pulse Rate (!) 115  Resp 20  Level of Consciousness Alert  SpO2 100 %  Assess: MEWS Score  MEWS Temp 0  MEWS Systolic 1  MEWS Pulse 2  MEWS RR 0  MEWS LOC 0  MEWS Score 3  MEWS Score Color Yellow  Assess: if the MEWS score is Yellow or Red  Were vital signs taken at a resting state? Yes  Focused Assessment Change from prior assessment (see assessment flowsheet)  Early Detection of Sepsis Score *See Row Information* Medium  MEWS guidelines implemented *See Row Information* Yes  Treat  MEWS Interventions Escalated (See documentation below);Other (Comment) (rapid response, syncopal episode)  Take Vital Signs  Increase Vital Sign Frequency  Yellow: Q 2hr X 2 then Q 4hr X 2, if remains yellow, continue Q 4hrs (Per MD q15 minutes)  Escalate  MEWS: Escalate Yellow: discuss with charge nurse/RN and consider discussing with provider and RRT  Notify: Charge Nurse/RN  Name of Charge Nurse/RN Notified Roswell Miners, RN  Date Charge Nurse/RN Notified 05/18/2021  Time Charge Nurse/RN Notified 0820  Notify: Provider  Provider Name/Title Berle Mull, MD  Date Provider Notified 05/27/2021  Time Provider Notified [phone]  Notification Type Page  Notification Reason Change in status  Provider response En route  Date of Provider Response 05/12/2021  Time of Provider Response 0827  Notify: Rapid Response  Name of Rapid Response RN Notified Skeet Latch, RN  Date Rapid Response Notified 06/01/2021  Time Rapid Response Notified 0820  Document  Patient Outcome Stabilized after interventions  Progress note created (see row info) Yes

## 2021-05-24 NOTE — Progress Notes (Addendum)
Patient running protonix, blood, and has NS ordered. Will need Zosyn which will require 3rd IV. Lost 3rd IV. Attempted insertion in right forearm, unsuccessful. Patient has had several sticks today alone. IV team consult placed.

## 2021-05-24 NOTE — Progress Notes (Addendum)
70 year old male with GI bleed likely secondary gastroduodenal ulcer.  He had bright red blood bowel movement this morning around 7 AM he had a syncopal episode and hypotension that responded appropriately to 2 L of normal saline. A stat CTA was ordered which I have personally review and discussed with radiologist.  There is no clear evidence of the source of bleeding there is still an abnormality around the gastroduodenal junction consistent with ulcer.  There is no worsening of the perforation.  He denies abdominal pain but he is tachycardic and he is hemodynamics are labile.  He has been ordered 2 units of blood. I have seen and examined this morning.  He is awake and alert and mentating adequately. Abd: soft, Nt, no peritonitis  A/P GI bleeding from presumed peptic ulcer.  CTA failed to demonstrate an active extravasation as source of bleeding. For now  Continue medical management with PPI red blood cells and appropriate resuscitation. Discussed extensively with Dr. Marius Ditch and we will plan to initiate rapid prep.  Followed by endoscopic evaluation. Fully aware that he may require surgical intervention.  Also please note that this patient had already had a perforated ulcer 30 years ago and apparently had a Phillip Heal patch performed. If the source is in fact the stomach and he continues to bleed may need a gastric resection.  Family and the patient is fully aware of the situation. Working on [employer] [facility] [employer] but from my understanding there was no beds available. Note that I spent over 40 minutes in this encounter with greater than 50% spent coordination and counseling of his care

## 2021-05-24 NOTE — Op Note (Signed)
[facility] Gastroenterology Patient Name: Timothy Shaffer Procedure Date: 05/12/2021 4:50 PM MRN: [id number] Account #: [id number] Date of Birth: 1950/12/11 Admit Type: Inpatient Age: 70 Room: OR 8 Gender: Male Note Status: Finalized Procedure:             Upper GI endoscopy Indications:           Epigastric abdominal pain, Acute post hemorrhagic                         anemia, Hematochezia, Melena Providers:             Lin Landsman MD, MD, [place] Medicines:             General Anesthesia Complications:         No immediate complications. Estimated blood loss: None. Procedure:             Pre-Anesthesia Assessment:                        - Prior to the procedure, a History and Physical was                         performed, and patient medications and allergies were                         reviewed. The patient is competent. The risks and                         benefits of the procedure and the sedation options and                         risks were discussed with the patient. All questions                         were answered and informed consent was obtained.                         Patient identification and proposed procedure were                         verified by the physician, the nurse, the                         anesthesiologist, the anesthetist and the technician                         in the pre-procedure area in the procedure room in the                         endoscopy suite. Mental Status Examination: alert and                         oriented. Airway Examination: normal oropharyngeal                         airway and neck mobility. Respiratory Examination:                         clear to auscultation. CV Examination: normal.  Prophylactic Antibiotics: The patient does not require                         prophylactic antibiotics. Prior Anticoagulants: The                         patient has taken no  previous anticoagulant or                         antiplatelet agents. ASA Grade Assessment: III - A                         patient with severe systemic disease. After reviewing                         the risks and benefits, the patient was deemed in                         satisfactory condition to undergo the procedure. The                         anesthesia plan was to use general anesthesia.                         Immediately prior to administration of medications,                         the patient was re-assessed for adequacy to receive                         sedatives. The heart rate, respiratory rate, oxygen                         saturations, blood pressure, adequacy of pulmonary                         ventilation, and response to care were monitored                         throughout the procedure. The physical status of the                         patient was re-assessed after the procedure.                        After obtaining informed consent, the endoscope was                         passed under direct vision. Throughout the procedure,                         the patient's blood pressure, pulse, and oxygen                         saturations were monitored continuously. The Endoscope                         was introduced through the mouth, and advanced to the  second part of duodenum. The upper GI endoscopy was                         accomplished without difficulty. The patient tolerated                         the procedure well. Findings:      Dr Dahlia Byes was present during the case in the OR      One non-obstructing non-bleeding cratered duodenal ulcer with a       nonbleeding visible vessel (Forrest Class IIa) was found in the duodenal       bulb. The lesion was 20 mm in largest dimension. Perforation of the       bowel wall cannot be ruled out (contained perforation based on the CT       scan). Area was successfully injected with 3 mL of a  1:10,000 solution       of epinephrine for hemostasis. For hemostasis, one hemostatic clip was       successfully placed at the base of the visible vessel. There was no       bleeding during, or at the end, of the procedure.      A medium-sized hiatal hernia was present.      The entire examined stomach was normal.      The cardia and gastric fundus were normal on retroflexion.      Circumferential salmon-colored mucosa was present. No other visible       abnormalities were present. Impression:            - Non-obstructing non-bleeding duodenal ulcer with a                         nonbleeding visible vessel (Forrest Class IIa). NSAID                         induced etiology. Perforation of the bowel wall cannot                         be ruled out. Injected. Clip was placed.                        - Medium-sized hiatal hernia.                        - Normal stomach.                        - Salmon-colored mucosa suspicious for short-segment                         Barrett's esophagus.                        - No specimens collected. Recommendation:        - Return patient to hospital ward for ongoing care.                        - Clear liquid diet today.                        - Continue present medications.                        -  Follow an antireflux regimen indefinitely.                        - Use Prilosec (omeprazole) 40 mg PO BID indefinitely.                        - Perform an H. pylori serology.                        - No ibuprofen, naproxen, or other non-steroidal                         anti-inflammatory drugs. Procedure Code(s):     --- Professional ---                        [phone], Esophagogastroduodenoscopy, flexible,                         transoral; with control of bleeding, any method Diagnosis Code(s):     --- Professional ---                        K93.818E, Adverse effect of other nonsteroidal                         anti-inflammatory drugs [NSAID], sequela                         K44.9, Diaphragmatic hernia without obstruction or                         gangrene                        K22.8, Other specified diseases of esophagus                        K26.4, Chronic or unspecified duodenal ulcer with                         hemorrhage                        R10.13, Epigastric pain                        D62, Acute posthemorrhagic anemia                        K92.1, Melena (includes Hematochezia) CPT copyright 2019 American Medical Association. All rights reserved. The codes documented in this report are preliminary and upon coder review may  be revised to meet current compliance requirements. Dr. Ulyess Mort Lin Landsman MD, MD 05/15/2021 6:35:08 PM This report has been signed electronically. Diego F Pabon,  Number of Addenda: 0 Note Initiated On: 05/13/2021 4:50 PM Estimated Blood Loss:  Estimated blood loss: none.      [facility]

## 2021-05-25 ENCOUNTER — Encounter: Admission: EM | Disposition: E | Payer: Self-pay | Source: Home / Self Care | Attending: Pulmonary Disease

## 2021-05-25 ENCOUNTER — Inpatient Hospital Stay: Payer: Medicare HMO

## 2021-05-25 ENCOUNTER — Inpatient Hospital Stay: Payer: Medicare HMO | Admitting: Anesthesiology

## 2021-05-25 ENCOUNTER — Encounter: Payer: Self-pay | Admitting: Gastroenterology

## 2021-05-25 ENCOUNTER — Inpatient Hospital Stay: Payer: Self-pay

## 2021-05-25 DIAGNOSIS — K262 Acute duodenal ulcer with both hemorrhage and perforation: Secondary | ICD-10-CM

## 2021-05-25 HISTORY — PX: LYSIS OF ADHESION: SHX5961

## 2021-05-25 HISTORY — PX: JEJUNOSTOMY: SHX313

## 2021-05-25 HISTORY — PX: LAPAROTOMY: SHX154

## 2021-05-25 LAB — CBC
HCT: 22.3 % — ABNORMAL LOW (ref 39.0–52.0)
HCT: 18 % — ABNORMAL LOW (ref 39.0–52.0)
Hemoglobin: 7.6 g/dL — ABNORMAL LOW (ref 13.0–17.0)
Hemoglobin: 6.1 g/dL — ABNORMAL LOW (ref 13.0–17.0)
MCH: 34.9 pg — ABNORMAL HIGH (ref 26.0–34.0)
MCH: 33.2 pg (ref 26.0–34.0)
MCHC: 34.1 g/dL (ref 30.0–36.0)
MCHC: 33.9 g/dL (ref 30.0–36.0)
MCV: 102.3 fL — ABNORMAL HIGH (ref 80.0–100.0)
MCV: 97.8 fL (ref 80.0–100.0)
Platelets: 132 10*3/uL — ABNORMAL LOW (ref 150–400)
Platelets: 108 10*3/uL — ABNORMAL LOW (ref 150–400)
RBC: 2.18 MIL/uL — ABNORMAL LOW (ref 4.22–5.81)
RBC: 1.84 MIL/uL — ABNORMAL LOW (ref 4.22–5.81)
RDW: 17.2 % — ABNORMAL HIGH (ref 11.5–15.5)
RDW: 18.1 % — ABNORMAL HIGH (ref 11.5–15.5)
WBC: 11.9 10*3/uL — ABNORMAL HIGH (ref 4.0–10.5)
WBC: 16 10*3/uL — ABNORMAL HIGH (ref 4.0–10.5)
nRBC: 0 % (ref 0.0–0.2)
nRBC: 0.1 % (ref 0.0–0.2)

## 2021-05-25 LAB — PROTIME-INR
INR: 1.5 — ABNORMAL HIGH (ref 0.8–1.2)
Prothrombin Time: 18 seconds — ABNORMAL HIGH (ref 11.4–15.2)

## 2021-05-25 LAB — BASIC METABOLIC PANEL
Anion gap: 4 — ABNORMAL LOW (ref 5–15)
BUN: 14 mg/dL (ref 8–23)
CO2: 20 mmol/L — ABNORMAL LOW (ref 22–32)
Calcium: 7.1 mg/dL — ABNORMAL LOW (ref 8.9–10.3)
Chloride: 112 mmol/L — ABNORMAL HIGH (ref 98–111)
Creatinine, Ser: 0.72 mg/dL (ref 0.61–1.24)
GFR, Estimated: >60 mL/min (ref >60)
Glucose, Bld: 155 mg/dL — ABNORMAL HIGH (ref 70–99)
Potassium: 3.7 mmol/L (ref 3.5–5.1)
Sodium: 136 mmol/L (ref 135–145)

## 2021-05-25 LAB — CBC WITH DIFFERENTIAL/PLATELET
Abs Immature Granulocytes: 0.84 10*3/uL — ABNORMAL HIGH (ref 0.00–0.07)
Abs Immature Granulocytes: 0.16 10*3/uL — ABNORMAL HIGH (ref 0.00–0.07)
Basophils Absolute: 0.1 10*3/uL (ref 0.0–0.1)
Basophils Absolute: 0 10*3/uL (ref 0.0–0.1)
Basophils Relative: 0 %
Basophils Relative: 0 %
Eosinophils Absolute: 0 10*3/uL (ref 0.0–0.5)
Eosinophils Absolute: 0 10*3/uL (ref 0.0–0.5)
Eosinophils Relative: 0 %
Eosinophils Relative: 0 %
HCT: 20.8 % — ABNORMAL LOW (ref 39.0–52.0)
HCT: 31.9 % — ABNORMAL LOW (ref 39.0–52.0)
Hemoglobin: 7.1 g/dL — ABNORMAL LOW (ref 13.0–17.0)
Hemoglobin: 11.1 g/dL — ABNORMAL LOW (ref 13.0–17.0)
Immature Granulocytes: 4 %
Immature Granulocytes: 1 %
Lymphocytes Relative: 7 %
Lymphocytes Relative: 3 %
Lymphs Abs: 1.4 10*3/uL (ref 0.7–4.0)
Lymphs Abs: 0.4 10*3/uL — ABNORMAL LOW (ref 0.7–4.0)
MCH: 31.8 pg (ref 26.0–34.0)
MCH: 31.4 pg (ref 26.0–34.0)
MCHC: 34.1 g/dL (ref 30.0–36.0)
MCHC: 34.8 g/dL (ref 30.0–36.0)
MCV: 93.3 fL (ref 80.0–100.0)
MCV: 90.4 fL (ref 80.0–100.0)
Monocytes Absolute: 1.2 10*3/uL — ABNORMAL HIGH (ref 0.1–1.0)
Monocytes Absolute: 1 10*3/uL (ref 0.1–1.0)
Monocytes Relative: 6 %
Monocytes Relative: 8 %
Neutro Abs: 16.4 10*3/uL — ABNORMAL HIGH (ref 1.7–7.7)
Neutro Abs: 10.4 10*3/uL — ABNORMAL HIGH (ref 1.7–7.7)
Neutrophils Relative %: 83 %
Neutrophils Relative %: 88 %
Platelets: 72 10*3/uL — ABNORMAL LOW (ref 150–400)
Platelets: 77 10*3/uL — ABNORMAL LOW (ref 150–400)
RBC: 2.23 MIL/uL — ABNORMAL LOW (ref 4.22–5.81)
RBC: 3.53 MIL/uL — ABNORMAL LOW (ref 4.22–5.81)
RDW: 14.6 % (ref 11.5–15.5)
RDW: 14.2 % (ref 11.5–15.5)
Smear Review: NORMAL
WBC: 19.9 10*3/uL — ABNORMAL HIGH (ref 4.0–10.5)
WBC: 11.9 10*3/uL — ABNORMAL HIGH (ref 4.0–10.5)
nRBC: 0.1 % (ref 0.0–0.2)
nRBC: 0 % (ref 0.0–0.2)

## 2021-05-25 LAB — BLOOD GAS, ARTERIAL
Acid-base deficit: 5 mmol/L — ABNORMAL HIGH (ref 0.0–2.0)
Bicarbonate: 23 mmol/L (ref 20.0–28.0)
FIO2: 0.4
MECHVT: 450 mL
Mechanical Rate: 12
O2 Saturation: 99.2 %
PEEP: 5 cmH2O
Patient temperature: 37
RATE: 12 resp/min
pCO2 arterial: 55 mmHg — ABNORMAL HIGH (ref 32.0–48.0)
pH, Arterial: 7.23 — ABNORMAL LOW (ref 7.350–7.450)
pO2, Arterial: 162 mmHg — ABNORMAL HIGH (ref 83.0–108.0)

## 2021-05-25 LAB — DIC (DISSEMINATED INTRAVASCULAR COAGULATION)PANEL
D-Dimer, Quant: 1.52 ug/mL-FEU — ABNORMAL HIGH (ref 0.00–0.50)
Fibrinogen: 140 mg/dL — ABNORMAL LOW (ref 210–475)
INR: 1.6 — ABNORMAL HIGH (ref 0.8–1.2)
Platelets: 74 10*3/uL — ABNORMAL LOW (ref 150–400)
Prothrombin Time: 18.6 seconds — ABNORMAL HIGH (ref 11.4–15.2)
Smear Review: NONE SEEN
aPTT: 39 seconds — ABNORMAL HIGH (ref 24–36)

## 2021-05-25 LAB — PREPARE RBC (CROSSMATCH)

## 2021-05-25 LAB — COMPREHENSIVE METABOLIC PANEL
ALT: 10 U/L (ref 0–44)
AST: 21 U/L (ref 15–41)
Albumin: 1.3 g/dL — ABNORMAL LOW (ref 3.5–5.0)
Alkaline Phosphatase: 21 U/L — ABNORMAL LOW (ref 38–126)
Anion gap: 9 (ref 5–15)
BUN: 15 mg/dL (ref 8–23)
CO2: 15 mmol/L — ABNORMAL LOW (ref 22–32)
Calcium: 6.5 mg/dL — ABNORMAL LOW (ref 8.9–10.3)
Chloride: 113 mmol/L — ABNORMAL HIGH (ref 98–111)
Creatinine, Ser: 0.7 mg/dL (ref 0.61–1.24)
GFR, Estimated: >60 mL/min (ref >60)
Glucose, Bld: 188 mg/dL — ABNORMAL HIGH (ref 70–99)
Potassium: 3.6 mmol/L (ref 3.5–5.1)
Sodium: 137 mmol/L (ref 135–145)
Total Bilirubin: 0.7 mg/dL (ref 0.3–1.2)
Total Protein: <3 g/dL — ABNORMAL LOW (ref 6.5–8.1)

## 2021-05-25 LAB — COMPREHENSIVE METABOLIC PANEL WITH GFR
ALT: 26 U/L (ref 0–44)
AST: 58 U/L — ABNORMAL HIGH (ref 15–41)
Albumin: 1.5 g/dL — ABNORMAL LOW (ref 3.5–5.0)
Alkaline Phosphatase: 29 U/L — ABNORMAL LOW (ref 38–126)
Anion gap: 7 (ref 5–15)
BUN: 17 mg/dL (ref 8–23)
CO2: 20 mmol/L — ABNORMAL LOW (ref 22–32)
Calcium: 8.4 mg/dL — ABNORMAL LOW (ref 8.9–10.3)
Chloride: 110 mmol/L (ref 98–111)
Creatinine, Ser: 0.92 mg/dL (ref 0.61–1.24)
GFR, Estimated: >60 mL/min (ref >60)
Glucose, Bld: 319 mg/dL — ABNORMAL HIGH (ref 70–99)
Potassium: 4 mmol/L (ref 3.5–5.1)
Sodium: 137 mmol/L (ref 135–145)
Total Bilirubin: 0.7 mg/dL (ref 0.3–1.2)
Total Protein: <3 g/dL — ABNORMAL LOW (ref 6.5–8.1)

## 2021-05-25 LAB — ALBUMIN: Albumin: 2.7 g/dL — ABNORMAL LOW (ref 3.5–5.0)

## 2021-05-25 LAB — GLUCOSE, CAPILLARY
Glucose-Capillary: 105 mg/dL — ABNORMAL HIGH (ref 70–99)
Glucose-Capillary: 176 mg/dL — ABNORMAL HIGH (ref 70–99)
Glucose-Capillary: 281 mg/dL — ABNORMAL HIGH (ref 70–99)
Glucose-Capillary: 95 mg/dL (ref 70–99)

## 2021-05-25 LAB — MRSA PCR SCREENING: MRSA by PCR: NEGATIVE

## 2021-05-25 LAB — TSH: TSH: 1.925 u[IU]/mL (ref 0.350–4.500)

## 2021-05-25 LAB — LACTIC ACID, PLASMA
Lactic Acid, Venous: 7.2 mmol/L (ref 0.5–1.9)
Lactic Acid, Venous: 1.9 mmol/L (ref 0.5–1.9)

## 2021-05-25 LAB — HEMOGLOBIN AND HEMATOCRIT, BLOOD
HCT: 26 % — ABNORMAL LOW (ref 39.0–52.0)
Hemoglobin: 9.2 g/dL — ABNORMAL LOW (ref 13.0–17.0)

## 2021-05-25 LAB — VITAMIN B12: Vitamin B-12: 263 pg/mL (ref 180–914)

## 2021-05-25 LAB — FOLATE: Folate: 6.4 ng/mL (ref >5.9)

## 2021-05-25 SURGERY — LAPAROTOMY, EXPLORATORY
Anesthesia: General | Site: Abdomen

## 2021-05-25 MED ORDER — SODIUM CHLORIDE 0.9% FLUSH: 10.0000 mL | INTRAVENOUS | Status: DC | PRN

## 2021-05-25 MED ORDER — LACTATED RINGERS IV SOLN: INTRAVENOUS | Status: DC | PRN

## 2021-05-25 MED ORDER — PANTOPRAZOLE 80MG IVPB - SIMPLE MED
60.0000 mg | Freq: Once | INTRAVENOUS | Status: AC
  Administered 2021-05-25: 60 mg via INTRAVENOUS
  Filled 2021-05-25: qty 100

## 2021-05-25 MED ORDER — MIDAZOLAM HCL 2 MG/2ML IJ SOLN
INTRAMUSCULAR | Status: DC | PRN
  Administered 2021-05-25: 2 mg via INTRAVENOUS

## 2021-05-25 MED ORDER — PROPOFOL 1000 MG/100ML IV EMUL
5.0000 ug/kg/min | INTRAVENOUS | Status: DC
  Administered 2021-05-25: 5 ug/kg/min via INTRAVENOUS
  Administered 2021-05-26: 10 ug/kg/min via INTRAVENOUS
  Administered 2021-05-26 – 2021-05-27 (×2): 20 ug/kg/min via INTRAVENOUS
  Administered 2021-05-28: 30 ug/kg/min via INTRAVENOUS
  Administered 2021-05-28: 20 ug/kg/min via INTRAVENOUS
  Administered 2021-05-28: 25 ug/kg/min via INTRAVENOUS
  Administered 2021-05-29 (×2): 30 ug/kg/min via INTRAVENOUS
  Administered 2021-05-30: 20 ug/kg/min via INTRAVENOUS
  Administered 2021-05-30 – 2021-05-31 (×3): 30 ug/kg/min via INTRAVENOUS
  Filled 2021-05-25 (×13): qty 100

## 2021-05-25 MED ORDER — FENTANYL CITRATE (PF) 100 MCG/2ML IJ SOLN
INTRAMUSCULAR | Status: AC
  Filled 2021-05-25: qty 2

## 2021-05-25 MED ORDER — CHLORHEXIDINE GLUCONATE 0.12% ORAL RINSE (MEDLINE KIT)
15.0000 mL | Freq: Two times a day (BID) | OROMUCOSAL | Status: DC
  Administered 2021-05-25 – 2021-05-31 (×13): 15 mL via OROMUCOSAL

## 2021-05-25 MED ORDER — HEMOSTATIC AGENTS (NO CHARGE) OPTIME
TOPICAL | Status: DC | PRN
  Administered 2021-05-25: 1 via TOPICAL
  Administered 2021-05-25 (×2): 2 via TOPICAL

## 2021-05-25 MED ORDER — DEXAMETHASONE SODIUM PHOSPHATE 10 MG/ML IJ SOLN
INTRAMUSCULAR | Status: DC | PRN
  Administered 2021-05-25: 5 mg via INTRAVENOUS

## 2021-05-25 MED ORDER — INSULIN REGULAR(HUMAN) IN NACL 100-0.9 UT/100ML-% IV SOLN
INTRAVENOUS | Status: DC
  Administered 2021-05-25: 3 [IU]/h via INTRAVENOUS

## 2021-05-25 MED ORDER — SODIUM CHLORIDE 0.9% IV SOLUTION: Freq: Once | INTRAVENOUS | Status: AC

## 2021-05-25 MED ORDER — INSULIN ASPART 100 UNIT/ML IJ SOLN
INTRAMUSCULAR | Status: DC | PRN
  Administered 2021-05-25: 3 [IU] via INTRAVENOUS
  Administered 2021-05-25: 5 [IU] via INTRAVENOUS

## 2021-05-25 MED ORDER — NOREPINEPHRINE 4 MG/250ML-% IV SOLN
INTRAVENOUS | Status: AC
  Filled 2021-05-25: qty 250

## 2021-05-25 MED ORDER — CHLORHEXIDINE GLUCONATE CLOTH 2 % EX PADS
6.0000 | MEDICATED_PAD | Freq: Every day | CUTANEOUS | Status: DC
  Administered 2021-05-25 – 2021-06-02 (×7): 6 via TOPICAL

## 2021-05-25 MED ORDER — PHENYLEPHRINE HCL (PRESSORS) 10 MG/ML IV SOLN
INTRAVENOUS | Status: DC | PRN
  Administered 2021-05-25: 100 ug via INTRAVENOUS

## 2021-05-25 MED ORDER — SODIUM CHLORIDE 0.9 % IV SOLN
INTRAVENOUS | Status: DC | PRN
  Administered 2021-05-25: 70 mL

## 2021-05-25 MED ORDER — MIDAZOLAM HCL 2 MG/2ML IJ SOLN
INTRAMUSCULAR | Status: AC
  Filled 2021-05-25: qty 2

## 2021-05-25 MED ORDER — SODIUM CHLORIDE 0.9% FLUSH
10.0000 mL | Freq: Two times a day (BID) | INTRAVENOUS | Status: DC
  Administered 2021-05-25 – 2021-05-30 (×12): 10 mL
  Administered 2021-05-31: 40 mL
  Administered 2021-05-31 – 2021-06-01 (×2): 10 mL

## 2021-05-25 MED ORDER — NOREPINEPHRINE BITARTRATE 1 MG/ML IV SOLN
INTRAVENOUS | Status: DC | PRN
  Administered 2021-05-25: 1 mL via INTRAVENOUS
  Administered 2021-05-25: 2 mL via INTRAVENOUS
  Administered 2021-05-25: 3 mL via INTRAVENOUS
  Administered 2021-05-25: 4 mL via INTRAVENOUS
  Administered 2021-05-25 (×3): 1 mL via INTRAVENOUS

## 2021-05-25 MED ORDER — FENTANYL CITRATE (PF) 100 MCG/2ML IJ SOLN
INTRAMUSCULAR | Status: DC | PRN
  Administered 2021-05-25 (×2): 100 ug via INTRAVENOUS
  Administered 2021-05-25: 150 ug via INTRAVENOUS

## 2021-05-25 MED ORDER — ROCURONIUM BROMIDE 50 MG/5ML IV SOLN
INTRAVENOUS | Status: AC
  Administered 2021-05-25: 10 mg
  Filled 2021-05-25: qty 1

## 2021-05-25 MED ORDER — BUPIVACAINE-EPINEPHRINE (PF) 0.25% -1:200000 IJ SOLN
INTRAMUSCULAR | Status: DC | PRN
  Administered 2021-05-25: 30 mL via PERINEURAL

## 2021-05-25 MED ORDER — ORAL CARE MOUTH RINSE
15.0000 mL | OROMUCOSAL | Status: DC
  Administered 2021-05-25 – 2021-06-02 (×66): 15 mL via OROMUCOSAL

## 2021-05-25 MED ORDER — PROPOFOL 10 MG/ML IV BOLUS
INTRAVENOUS | Status: AC
  Filled 2021-05-25: qty 20

## 2021-05-25 MED ORDER — ALBUMIN HUMAN 25 % IV SOLN
25.0000 g | Freq: Once | INTRAVENOUS | Status: AC
  Administered 2021-05-25: 25 g via INTRAVENOUS
  Filled 2021-05-25: qty 100

## 2021-05-25 MED ORDER — FENTANYL 2500MCG IN NS 250ML (10MCG/ML) PREMIX INFUSION
INTRAVENOUS | Status: AC
  Filled 2021-05-25: qty 250

## 2021-05-25 MED ORDER — NOREPINEPHRINE 4 MG/250ML-% IV SOLN
INTRAVENOUS | Status: DC | PRN
  Administered 2021-05-25: 25 ug/min via INTRAVENOUS

## 2021-05-25 MED ORDER — VISTASEAL 10 ML SINGLE DOSE KIT
PACK | CUTANEOUS | Status: DC | PRN
  Administered 2021-05-25: 10 mL via TOPICAL

## 2021-05-25 MED ORDER — FENTANYL 2500MCG IN NS 250ML (10MCG/ML) PREMIX INFUSION
0.0000 ug/h | INTRAVENOUS | Status: DC
  Administered 2021-05-25: 25 ug/h via INTRAVENOUS
  Administered 2021-05-26: 150 ug/h via INTRAVENOUS
  Administered 2021-05-27 – 2021-05-30 (×7): 200 ug/h via INTRAVENOUS
  Administered 2021-05-30: 300 ug/h via INTRAVENOUS
  Administered 2021-05-31 (×2): 275 ug/h via INTRAVENOUS
  Filled 2021-05-25 (×11): qty 250

## 2021-05-25 MED ORDER — LACTATED RINGERS IV BOLUS
1000.0000 mL | Freq: Once | INTRAVENOUS | Status: AC
  Administered 2021-05-25: 1000 mL via INTRAVENOUS

## 2021-05-25 MED ORDER — ROCURONIUM BROMIDE 100 MG/10ML IV SOLN
INTRAVENOUS | Status: DC | PRN
  Administered 2021-05-25: 20 mg via INTRAVENOUS
  Administered 2021-05-25 (×2): 40 mg via INTRAVENOUS
  Administered 2021-05-25: 20 mg via INTRAVENOUS

## 2021-05-25 MED ORDER — FENTANYL CITRATE (PF) 100 MCG/2ML IJ SOLN
INTRAMUSCULAR | Status: AC
  Administered 2021-05-25: 100 ug
  Filled 2021-05-25: qty 2

## 2021-05-25 MED ORDER — FENTANYL CITRATE (PF) 250 MCG/5ML IJ SOLN
INTRAMUSCULAR | Status: AC
  Filled 2021-05-25: qty 5

## 2021-05-25 MED ORDER — SODIUM CHLORIDE 0.9% IV SOLUTION: Freq: Once | INTRAVENOUS | Status: DC

## 2021-05-25 MED ORDER — SODIUM CHLORIDE (PF) 0.9 % IJ SOLN
INTRAMUSCULAR | Status: AC
  Filled 2021-05-25: qty 50

## 2021-05-25 MED ORDER — VASOPRESSIN 20 UNIT/ML IV SOLN
INTRAVENOUS | Status: DC | PRN
  Administered 2021-05-25: 3 [IU] via INTRAVENOUS
  Administered 2021-05-25: 2 [IU] via INTRAVENOUS
  Administered 2021-05-25: 1 [IU] via INTRAVENOUS
  Administered 2021-05-25: 3 [IU] via INTRAVENOUS

## 2021-05-25 MED ORDER — HEMOSTATIC AGENTS (NO CHARGE) OPTIME
TOPICAL | Status: DC | PRN
  Administered 2021-05-25 (×2): 1 via TOPICAL

## 2021-05-25 MED ORDER — SODIUM CHLORIDE 0.9 % IV BOLUS
1000.0000 mL | Freq: Once | INTRAVENOUS | Status: AC
  Administered 2021-05-25: 1000 mL via INTRAVENOUS

## 2021-05-25 MED ORDER — MICROFIBRILLAR COLL HEMOSTAT EX PADS
MEDICATED_PAD | CUTANEOUS | Status: DC | PRN
  Administered 2021-05-25 (×2): 1 via TOPICAL

## 2021-05-25 MED ORDER — SODIUM CHLORIDE 0.9 % IV SOLN: 250.0000 mL | INTRAVENOUS | Status: DC

## 2021-05-25 MED ORDER — MIDAZOLAM HCL 2 MG/2ML IJ SOLN
INTRAMUSCULAR | Status: AC
  Administered 2021-05-25: 2 mg
  Filled 2021-05-25: qty 2

## 2021-05-25 MED ORDER — CALCIUM CHLORIDE 10 % IV SOLN
INTRAVENOUS | Status: DC | PRN
  Administered 2021-05-25 (×2): 1 g via INTRAVENOUS

## 2021-05-25 SURGICAL SUPPLY — 62 items
APL PRP STRL LF DISP 70% ISPRP (MISCELLANEOUS) ×1
BARRIER ADH SEPRAFILM 3INX5IN (MISCELLANEOUS) ×6 IMPLANT
BLADE CLIPPER SURG (BLADE) ×3 IMPLANT
BLADE SURG SZ10 CARB STEEL (BLADE) ×3 IMPLANT
BNDG GAUZE ELAST 4 BULKY (GAUZE/BANDAGES/DRESSINGS) ×3 IMPLANT
BRR ADH 5X3 SEPRAFILM 2 SHT (MISCELLANEOUS) ×2
BULB RESERV EVAC DRAIN JP 100C (MISCELLANEOUS) ×3 IMPLANT
CATH ROBINSON RED A/P 18FR (CATHETERS) ×3 IMPLANT
CHLORAPREP W/TINT 26 (MISCELLANEOUS) ×3 IMPLANT
COVER BACK TABLE REUSABLE LG (DRAPES) ×3 IMPLANT
COVER WAND RF STERILE (DRAPES) ×3 IMPLANT
DRAIN CHANNEL JP 19F (MISCELLANEOUS) ×3 IMPLANT
DRAPE LAPAROTOMY 100X77 ABD (DRAPES) ×3 IMPLANT
DRSG MEPILEX SACRM 8.7X9.8 (GAUZE/BANDAGES/DRESSINGS) ×3 IMPLANT
DRSG OPSITE POSTOP 4X10 (GAUZE/BANDAGES/DRESSINGS) IMPLANT
DRSG OPSITE POSTOP 4X12 (GAUZE/BANDAGES/DRESSINGS) IMPLANT
ELECT BLADE 6.5 EXT (BLADE) ×6 IMPLANT
ELECT REM PT RETURN 9FT ADLT (ELECTROSURGICAL) ×3
ELECTRODE REM PT RTRN 9FT ADLT (ELECTROSURGICAL) ×1 IMPLANT
GAUZE SPONGE 4X4 16PLY XRAY LF (GAUZE/BANDAGES/DRESSINGS) ×3 IMPLANT
GLOVE SURG ENC MOIS LTX SZ7 (GLOVE) ×3 IMPLANT
GOWN STRL REUS W/ TWL LRG LVL3 (GOWN DISPOSABLE) ×2 IMPLANT
GOWN STRL REUS W/TWL LRG LVL3 (GOWN DISPOSABLE) ×6
HANDLE SUCTION POOLE (INSTRUMENTS) ×1 IMPLANT
HANDLE YANKAUER SUCT BULB TIP (MISCELLANEOUS) ×3 IMPLANT
HEMOSTAT ARISTA ABSORB 3G PWDR (HEMOSTASIS) ×6 IMPLANT
HEMOSTAT SURGICEL 2X14 (HEMOSTASIS) ×12 IMPLANT
HEMOSTAT SURGICEL 2X3 (HEMOSTASIS) ×3 IMPLANT
LIGASURE IMPACT 36 18CM CVD LR (INSTRUMENTS) ×3 IMPLANT
MANIFOLD NEPTUNE II (INSTRUMENTS) ×3 IMPLANT
MAT GRAY ABSORB FLUID 28X50 (MISCELLANEOUS) ×6 IMPLANT
NEEDLE HYPO 22GX1.5 SAFETY (NEEDLE) ×6 IMPLANT
NS IRRIG 1000ML POUR BTL (IV SOLUTION) ×24 IMPLANT
NS IRRIG 500ML POUR BTL (IV SOLUTION) ×6 IMPLANT
PACK BASIN MAJOR ARMC (MISCELLANEOUS) ×3 IMPLANT
PAD ABD DERMACEA PRESS 5X9 (GAUZE/BANDAGES/DRESSINGS) ×3 IMPLANT
PLUG CATH AND CAP STER (CATHETERS) ×3 IMPLANT
RELOAD PROXIMATE 75MM BLUE (ENDOMECHANICALS) IMPLANT
SPONGE DRAIN TRACH 4X4 STRL 2S (GAUZE/BANDAGES/DRESSINGS) ×6 IMPLANT
SPONGE KITTNER 5P (MISCELLANEOUS) ×3 IMPLANT
SPONGE LAP 18X18 RF (DISPOSABLE) ×33 IMPLANT
SPONGE LAP 18X36 RFD (DISPOSABLE) ×3 IMPLANT
STAPLER SKIN PROX 35W (STAPLE) ×3 IMPLANT
SUCTION POOLE HANDLE (INSTRUMENTS) ×3
SUT ETHILON 3-0 FS-10 30 BLK (SUTURE) ×3
SUT PDS AB 0 CT1 27 (SUTURE) ×9 IMPLANT
SUT SILK 2 0 (SUTURE) ×3
SUT SILK 2 0 SH CR/8 (SUTURE) ×3 IMPLANT
SUT SILK 2 0SH CR/8 30 (SUTURE) ×12 IMPLANT
SUT SILK 2-0 18XBRD TIE 12 (SUTURE) ×1 IMPLANT
SUT VIC AB 0 CT1 36 (SUTURE) ×6 IMPLANT
SUT VIC AB 2-0 SH 27 (SUTURE) ×6
SUT VIC AB 2-0 SH 27XBRD (SUTURE) ×2 IMPLANT
SUT VIC AB 3-0 SH 27 (SUTURE) ×3
SUT VIC AB 3-0 SH 27X BRD (SUTURE) ×1 IMPLANT
SUTURE EHLN 3-0 FS-10 30 BLK (SUTURE) ×1 IMPLANT
SYR 20ML LL LF (SYRINGE) ×3 IMPLANT
SYR 30ML LL (SYRINGE) ×9 IMPLANT
SYR 3ML LL SCALE MARK (SYRINGE) ×3 IMPLANT
SYR BULB IRRIG 60ML STRL (SYRINGE) ×3 IMPLANT
TAPE CLOTH SURG 4X10 WHT LF (GAUZE/BANDAGES/DRESSINGS) ×6 IMPLANT
TOWEL OR 17X26 4PK STRL BLUE (TOWEL DISPOSABLE) ×3 IMPLANT

## 2021-05-25 NOTE — Progress Notes (Signed)
Patient's silver colored necklace sent home with his wife along with patient's hat and underwear.

## 2021-05-25 NOTE — Anesthesia Postprocedure Evaluation (Signed)
Anesthesia Post Note  Patient: Timothy Shaffer  Procedure(s) Performed: EXPLORATORY LAPAROTOMY WITH OVERSOWING OF BLEEDING ULCER (Abdomen) LYSIS OF ADHESION (Abdomen) FEEDING JEJUNOSTOMY (Abdomen)  Patient location during evaluation: ICU Anesthesia Type: General Level of consciousness: sedated and patient remains intubated per anesthesia plan Pain management: pain level controlled Vital Signs Assessment: post-procedure vital signs reviewed and stable Respiratory status: patient remains intubated per anesthesia plan Cardiovascular status: stable Postop Assessment: no apparent nausea or vomiting Anesthetic complications: no   No notable events documented.   Last Vitals:  Vitals:   05/07/2021 1958 05/21/2021 2000  BP:    Pulse:    Resp:    Temp:  (!) 36.3 C  SpO2: 100%     Last Pain:  Vitals:   05/07/2021 2000  TempSrc: Axillary  PainSc:                  Arita Miss

## 2021-05-25 NOTE — Transfer of Care (Signed)
Immediate Anesthesia Transfer of Care Note  Patient: Timothy Shaffer  Procedure(s) Performed: EXPLORATORY LAPAROTOMY WITH OVERSOWING OF BLEEDING ULCER (Abdomen) LYSIS OF ADHESION (Abdomen) FEEDING JEJUNOSTOMY (Abdomen)  Patient Location: PACU and ICU  Anesthesia Type:General  Level of Consciousness: sedated  Airway & Oxygen Therapy: Patient remains intubated per anesthesia plan  Post-op Assessment: Report given to RN and Post -op Vital signs reviewed and stable  Post vital signs: Reviewed and stable  Last Vitals:  Vitals Value Taken Time  BP 114/88 05/31/2021 1440  Temp    Pulse 102 05/13/2021 1459  Resp 12 05/15/2021 1459  SpO2 100 % 05/16/2021 1459  Vitals shown include unvalidated device data.  Last Pain:  Vitals:   06/01/2021 0930  TempSrc: Oral  PainSc:          Complications: No notable events documented.

## 2021-05-25 NOTE — Progress Notes (Addendum)
Dr. Posey Pronto, Dr. Marius Ditch, & Dr. Dahlia Byes notified 0800 of BP 72/55. Charge RN Skeet Latch in contact due to patient status, recent blood loss, HG drop. Bolus 1000 mL NSS hung per order, protonix bolus given per order. BPs increased to upper 80's/60's. 0829 code blue called due to patient going rigid, unresponsive. Very large melanic stool. Bed trendelenberg. Initiated blood transfusion, verified with [place] Miners RN. Started at 377mL/hour per MD due to emergency/instability. Unable to scan given code blue. Transferred immediately to ICU. Report to accepting ICU RN.

## 2021-05-25 NOTE — Procedures (Signed)
Endotracheal Intubation: Patient required placement of an artificial airway secondary to Respiratory Failure  Consent: Emergent.   Hand washing performed prior to starting the procedure.   Medications administered for sedation prior to procedure:  Midazolam 2 mg IV,  Rocuronium 40 mg IV, Fentanyl 100 mcg IV.    A time out procedure was called and correct patient, name, & ID confirmed. Needed supplies and equipment were assembled and checked to include ETT, 10 ml syringe, Glidescope, Mac and Miller blades, suction, oxygen and bag mask valve, end tidal CO2 monitor.   Patient was positioned to align the mouth and pharynx to facilitate visualization of the glottis.   Heart rate, SpO2 and blood pressure was continuously monitored during the procedure. Pre-oxygenation was conducted prior to intubation and endotracheal tube was placed through the vocal cords into the trachea.     The artificial airway was placed under direct visualization via glidescope route using a 8.0 ETT on the first attempt.  ETT was secured at 25 cm mark.  Placement was confirmed by auscuitation of lungs with good breath sounds bilaterally and no stomach sounds.  Condensation was noted on endotracheal tube.   Pulse ox 98%.  CO2 detector in place with appropriate color change.   Complications: None .    Chest radiograph ordered and pending.   Comments: OGT placed via glidescope.   Ottie Glazier, M.D.  [facility] [place]

## 2021-05-25 NOTE — Progress Notes (Signed)
Made Dr. Lanney Gins aware that patient's diastolic BP is stay around 110 per art line and that cuff DBP is 123 with current BP 162/110 and that patient is following commands, denies pain on 75 mcg/H of fentanyl. MD gave order to start propofol drip.

## 2021-05-25 NOTE — Consult Note (Signed)
CRITICAL CARE PROGRESS NOTE    Name: Timothy Shaffer MRN: [id number] DOB: 11-05-1951     LOS: 2   SUBJECTIVE FINDINGS & SIGNIFICANT EVENTS    Patient description:  70 yo M with PMH of AAA, mod persistent-asthma, ADP, PUD, cecal mass, GIB, GERD, pneumothorax s/p chest tube, hx of ex-lap with PUD and perforation in the past, protein cal malnutrition, left renal mass who came in with UGIB s/p GI evalation with EGD/c-Scope requiring clipping intervention of bleeding ulcer with initial response to stable vital signs. Overnight on medica floor patient with clinical deterioration and instability of vital and significant hematochezia leading to unresponsiveness with circulatory shock. Patient was brought in to MICU emergently intubated placed on MV with requirement of pRBC transfusion and norepinephrine infusion as well as central vascular access for rapid ressussitation.    I met with surgery team and reviewed medical plan with Dr Dahlia Byes additionally patient with severe advanced comorbid status requiring general anesthesia and I have met with Anesthesia team and reviewed care plan with Dr Bertell Maria prior to surgery.  Family has been updated and Dr Posey Pronto has discussed case with family.  Lines/tubes : Airway 8 mm (Active)  Secured at (cm) 25 cm 05/08/2021 0845  Measured From Lips 05/15/2021 0845  [place] 05/24/2021 0845  Secured By [employer] 06/01/2021 0845  Prone position No 05/10/2021 0845  Cuff Pressure (cm H2O) [place] OR 18-26 [facility] 05/30/2021 0845  Site Condition Cool;Dry 05/29/2021 0845     External Urinary Catheter (Active)  Output (mL) 300 mL 05/28/2021 0520    Microbiology/Sepsis markers: Results for orders placed or performed during the hospital encounter of 05/22/2021  Resp Panel by RT-PCR (Flu A&B,  Covid) Nasopharyngeal Swab     Status: None   Collection Time: 05/21/2021 10:59 AM   Specimen: Nasopharyngeal Swab; Nasopharyngeal(NP) swabs in vial transport medium  Result Value Ref Range Status   SARS Coronavirus 2 by RT PCR NEGATIVE NEGATIVE Final    Comment: (NOTE) SARS-CoV-2 target nucleic acids are NOT DETECTED.  The SARS-CoV-2 RNA is generally detectable in upper respiratory specimens during the acute phase of infection. The lowest concentration of SARS-CoV-2 viral copies this assay can detect is 138 copies/mL. A negative result does not preclude SARS-Cov-2 infection and should not be used as the sole basis for treatment or other patient management decisions. A negative result may occur with  improper specimen collection/handling, submission of specimen other than nasopharyngeal swab, presence of viral mutation(s) within the areas targeted by this assay, and inadequate number of viral copies(<138 copies/mL). A negative result must be combined with clinical observations, patient history, and epidemiological information. The expected result is Negative.  Fact Sheet for Patients:  EntrepreneurPulse.com.au  Fact Sheet for Healthcare Providers:  IncredibleEmployment.be  This test is no t yet approved or cleared by the [place] FDA and  has been authorized for detection and/or diagnosis of SARS-CoV-2 by FDA under an Emergency Use Authorization (EUA). This EUA will remain  in effect (meaning this test can be used) for the duration of the COVID-19 declaration under Section 564(b)(1) of the Act, 21 U.S.C.section 360bbb-3(b)(1), unless the authorization is terminated  or revoked sooner.       Influenza A by PCR NEGATIVE NEGATIVE Final   Influenza B by PCR NEGATIVE NEGATIVE Final    Comment: (NOTE) The Xpert Xpress SARS-CoV-2/FLU/RSV plus assay is intended as an aid in the diagnosis of influenza from Nasopharyngeal swab specimens and should  not be used as  a sole basis for treatment. Nasal washings and aspirates are unacceptable for Xpert Xpress SARS-CoV-2/FLU/RSV testing.  Fact Sheet for Patients: EntrepreneurPulse.com.au  Fact Sheet for Healthcare Providers: IncredibleEmployment.be  This test is not yet approved or cleared by the [place] FDA and has been authorized for detection and/or diagnosis of SARS-CoV-2 by FDA under an Emergency Use Authorization (EUA). This EUA will remain in effect (meaning this test can be used) for the duration of the COVID-19 declaration under Section 564(b)(1) of the Act, 21 U.S.C. section 360bbb-3(b)(1), unless the authorization is terminated or revoked.  Performed at [facility], [address] [place], [place] [address]   Culture, blood (x 2)     Status: None (Preliminary result)   Collection Time: 05/12/2021  4:42 PM   Specimen: BLOOD  Result Value Ref Range Status   Specimen Description BLOOD BLOOD RIGHT WRIST  Final   Special Requests   Final    BOTTLES DRAWN AEROBIC AND ANAEROBIC Blood Culture adequate volume   Culture   Final    NO GROWTH 2 DAYS Performed at [facility], [address] [place], [place] [address]    Report Status PENDING  Incomplete  Culture, blood (x 2)     Status: None (Preliminary result)   Collection Time: 05/29/2021  4:53 PM   Specimen: BLOOD  Result Value Ref Range Status   Specimen Description BLOOD BLOOD LEFT HAND  Final   Special Requests   Final    BOTTLES DRAWN AEROBIC AND ANAEROBIC Blood Culture adequate volume   Culture   Final    NO GROWTH 2 DAYS Performed at [facility], [address] [place], [place] [address]    Report Status PENDING  Incomplete    Anti-infectives:  Anti-infectives (From admission, onward)    Start     Dose/Rate Route Frequency Ordered Stop   05/19/2021 1545  piperacillin-tazobactam (ZOSYN) IVPB 3.375 g        3.375 g 12.5 mL/hr over 240  Minutes Intravenous Every 8 hours 05/20/2021 1531          Consults: Treatment Team:  Lin Landsman, MD Jules Husbands, MD     PAST MEDICAL HISTORY   Past Medical History:  Diagnosis Date   AAA (abdominal aortic aneurysm) ([place])    Asthma    allergies   Complication of anesthesia    Family history of adverse reaction to anesthesia    PONV   Perforated duodenal ulcer ([place]) 05/21/1992   Emergency surgery at [facility], [place], [place]   PONV (postoperative nausea and vomiting)    PUD (peptic ulcer disease)      SURGICAL HISTORY   Past Surgical History:  Procedure Laterality Date   COLONOSCOPY  04/2020   COLONOSCOPY WITH PROPOFOL N/A 05/31/2021   Procedure: COLONOSCOPY WITH PROPOFOL;  Surgeon: Lin Landsman, MD;  Location: [facility] ORS;  Service: Gastroenterology;  Laterality: N/A;   ESOPHAGOGASTRODUODENOSCOPY (EGD) WITH PROPOFOL N/A 05/12/2021   Procedure: ESOPHAGOGASTRODUODENOSCOPY (EGD) WITH PROPOFOL;  Surgeon: Lin Landsman, MD;  Location: [facility] ORS;  Service: Gastroenterology;  Laterality: N/A;   MASS EXCISION Left 04/24/2014   Procedure: LEFT LONG & RING FINGERS DISTAL INTERPHALANGEAL JOINT/CYST EXCISION;  Surgeon: Cammie Sickle., MD;  Location: [place];  Service: Orthopedics;  Laterality: Left;   REPAIR OF PERFORATED ULCER  1993   [facility], [place], [place]     FAMILY HISTORY   Family History  Problem Relation Age of Onset   Gallbladder disease  Sister    Breast cancer Daughter      SOCIAL HISTORY   Social History   Tobacco Use   Smoking status: Every Day    Packs/day: 0.50    Years: 15.00    Pack years: 7.50    Types: Cigarettes   Smokeless tobacco: Never  Vaping Use   Vaping Use: Never used  Substance Use Topics   Alcohol use: Yes    Alcohol/week: 12.0 standard drinks    Types: 12 Cans of beer per week   Drug use: Yes    Types: Marijuana     MEDICATIONS   Current Medication:  Current Facility-Administered Medications:     0.9 %  sodium chloride infusion (Manually program via Guardrails IV Fluids), , Intravenous, Once, Lavina Hamman, MD   0.9 %  sodium chloride infusion (Manually program via Guardrails IV Fluids), , Intravenous, Once, Lavina Hamman, MD   0.9 %  sodium chloride infusion (Manually program via Guardrails IV Fluids), , Intravenous, Once, Lavina Hamman, MD   0.9 %  sodium chloride infusion, , Intravenous, Continuous, Ivor Costa, MD, Last Rate: 100 mL/hr at 06/03/2021 0355, Infusion Verify at 06/04/2021 0355   0.9 %  sodium chloride infusion, , Intravenous, PRN, Ivor Costa, MD, Stopped at 05/29/2021 0356   0.9 %  sodium chloride infusion, 250 mL, Intravenous, Continuous, Lanney Gins, Taline Nass, MD   acetaminophen (TYLENOL) suppository 650 mg, 650 mg, Rectal, Q6H PRN, Ivor Costa, MD   albumin human 25 % solution 25 g, 25 g, Intravenous, Once, Pabon, Diego F, MD   albuterol (PROVENTIL) (2.5 MG/3ML) 0.083% nebulizer solution 2.5 mg, 2.5 mg, Nebulization, Q4H PRN, Ivor Costa, MD   fentaNYL 10 mcg/ml infusion, , , ,    folic acid 1 mg in sodium chloride 0.9 % 50 mL IVPB, 1 mg, Intravenous, Daily, Ivor Costa, MD, Stopped at 05/08/2021 1720   LORazepam (ATIVAN) injection 0.5 mg, 0.5 mg, Intravenous, Q4H PRN, Lavina Hamman, MD, 0.5 mg at 05/12/2021 1512   LORazepam (ATIVAN) tablet 1-4 mg, 1-4 mg, Oral, Q1H PRN **OR** LORazepam (ATIVAN) injection 1-4 mg, 1-4 mg, Intravenous, Q1H PRN, Ivor Costa, MD   mometasone-formoterol (DULERA) 100-5 MCG/ACT inhaler 2 puff, 2 puff, Inhalation, BID, Ivor Costa, MD, 2 puff at 05/19/2021 2132   morphine 2 MG/ML injection 1 mg, 1 mg, Intravenous, Q4H PRN, Ivor Costa, MD   nicotine (NICODERM CQ - dosed in mg/24 hours) patch 21 mg, 21 mg, Transdermal, Daily, Ivor Costa, MD, 21 mg at 05/08/2021 0957   norepinephrine (LEVOPHED) 4-5 MG/250ML-% infusion SOLN, , , ,    ondansetron (ZOFRAN) injection 4 mg, 4 mg, Intravenous, Q8H PRN, Ivor Costa, MD   pantoprazole (PROTONIX) 80 mg /NS 100 mL IVPB, 60 mg,  Intravenous, Once, Lavina Hamman, MD   Derrill Memo ON 05/27/2021] pantoprazole (PROTONIX) injection 40 mg, 40 mg, Intravenous, Q12H, Ivor Costa, MD   pantoprozole (PROTONIX) 80 mg /NS 100 mL infusion, 8 mg/hr, Intravenous, Continuous, Ivor Costa, MD, Last Rate: 10 mL/hr at 05/28/2021 0621, 8 mg/hr at 05/08/2021 0621   piperacillin-tazobactam (ZOSYN) IVPB 3.375 g, 3.375 g, Intravenous, Q8H, Chinita [place] A, RPH, Last Rate: 12.5 mL/hr at 05/21/2021 0525, 3.375 g at 05/17/2021 0525   sodium chloride 0.9 % bolus 1,000 mL, 1,000 mL, Intravenous, Once, Lavina Hamman, MD   thiamine (B-1) injection 100 mg, 100 mg, Intravenous, Daily, Ivor Costa, MD, 100 mg at 05/19/2021 1320    ALLERGIES   Nsaids    REVIEW OF SYSTEMS  Patient is unresponsive   PHYSICAL EXAMINATION   Vital Signs: Temp:  [97.2 F (36.2 C)-98.5 F (36.9 C)] 97.4 F (36.3 C) (06/19 0910) Pulse Rate:  [77-123] 116 (06/19 0905) Resp:  [10-26] 10 (06/19 0905) BP: (72-136)/(55-91) 120/81 (06/19 0905) SpO2:  [97 %-100 %] 100 % (06/19 0905) FiO2 (%):  [40 %] 40 % (06/19 0905)  GENERAL:age appropriate anarexic unresponsive  HEAD: Normocephalic, atraumatic.  EYES: Pupils equal, round, reactive to light.  No scleral icterus.  MOUTH: Moist mucosal membrane. NECK: Supple. No thyromegaly. No nodules. No JVD.  PULMONARY: rhonchi CARDIOVASCULAR: S1 and S2. Regular rate and rhythm. No murmurs, rubs, or gallops.  GASTROINTESTINAL: Soft, nontender, non-distended. No masses. Positive bowel sounds. No hepatosplenomegaly.  MUSCULOSKELETAL: No swelling, clubbing, or edema.  NEUROLOGIC: Mild distress due to acute illness SKIN:intact,warm,dry   PERTINENT DATA     Infusions:  sodium chloride 100 mL/hr at 05/07/2021 0355   sodium chloride Stopped (05/10/2021 0356)   sodium chloride     albumin human     fentaNYL     folic acid (FOLVITE) IVPB Stopped (05/13/2021 1720)   norepinephrine     pantoprazole (PROTONIX) IV     pantoprazole 8 mg/hr  (05/16/2021 0621)   piperacillin-tazobactam (ZOSYN)  IV 3.375 g (05/10/2021 0525)   sodium chloride     Scheduled Medications:  sodium chloride   Intravenous Once   sodium chloride   Intravenous Once   sodium chloride   Intravenous Once   mometasone-formoterol  2 puff Inhalation BID   nicotine  21 mg Transdermal Daily   [START ON 05/27/2021] pantoprazole  40 mg Intravenous Q12H   thiamine injection  100 mg Intravenous Daily   PRN Medications: sodium chloride, acetaminophen, albuterol, LORazepam, LORazepam **OR** LORazepam, morphine injection, ondansetron (ZOFRAN) IV Hemodynamic parameters:   Intake/Output: 06/18 0701 - 06/19 0700 In: 3229.4 [I.V.:1987.8; Blood:690; IV Piggyback:551.6] Out: 320 [Urine:300; Blood:20]  Ventilator  Settings: Vent Mode: PRVC FiO2 (%):  [40 %] 40 % Set Rate:  [16 bmp] 16 bmp Vt Set:  [450 mL] 450 mL PEEP:  [5 cmH20] 5 cmH20 Plateau Pressure:  [11 cmH20] 11 cmH20    LAB RESULTS:  Basic Metabolic Panel: Recent Labs  Lab 05/18/2021 1048 05/16/2021 0625 05/18/2021 0259  NA 131* 136 136  K 3.1* 3.8 3.7  CL 100 107 112*  CO2 24 24 20*  GLUCOSE 174* 77 155*  BUN 28* 16 14  CREATININE 0.88 0.71 0.72  CALCIUM 8.2* 7.8* 7.1*  MG 1.4*  --   --   PHOS 3.2  --   --    Liver Function Tests: Recent Labs  Lab 05/07/2021 1048  AST 32  ALT 24  ALKPHOS 59  BILITOT 1.3*  PROT 5.2*  ALBUMIN 3.2*   No results for input(s): LIPASE, AMYLASE in the last 168 hours. No results for input(s): AMMONIA in the last 168 hours. CBC: Recent Labs  Lab 05/20/2021 1048 05/29/2021 1642 05/27/2021 0045 05/20/2021 0625 05/18/2021 2110 06/01/2021 0259 06/03/2021 0854  WBC 14.5*   < > 10.1 9.8 13.0* 11.9* 16.0*  NEUTROABS 12.2*  --   --   --   --   --   --   HGB 11.1*   < > 8.8* 8.6* 9.5* 7.6* 6.1*  HCT 31.8*   < > 25.5* 24.9* 27.2* 22.3* 18.0*  MCV 106.7*   < > 108.1* 109.2* 101.1* 102.3* 97.8  PLT 184   < > 149* 145* 133* 132* 108*   < > =  values in this interval not displayed.    Cardiac Enzymes: No results for input(s): CKTOTAL, CKMB, CKMBINDEX, TROPONINI in the last 168 hours. BNP: Invalid input(s): POCBNP CBG: Recent Labs  Lab 05/28/2021 0747 05/13/2021 0825  GLUCAP 24 131*       IMAGING RESULTS:  Imaging: DG UGI W SINGLE CM (SOL OR THIN BA)  Result Date: 05/26/2021 CLINICAL DATA:  Evaluate gastric ulceration with possible perforation. EXAM: WATER SOLUBLE UPPER GI SERIES TECHNIQUE: Single-column upper GI series was performed using water soluble contrast. CONTRAST:  100 mL Omnipaque 300 orally COMPARISON:  CT abdomen 05/17/2021 FLUOROSCOPY TIME:  Fluoroscopy Time:  1 minutes 36 seconds Radiation Exposure Index (if provided by the fluoroscopic device): 35.1 mGy Number of Acquired Spot Images: 0 FINDINGS: Examination of the esophagus demonstrated normal esophageal motility. Normal esophageal morphology without evidence of esophagitis or ulceration. No esophageal stricture, diverticula, or mass lesion. No evidence of hiatal hernia. Mild gastroesophageal reflux. Mucosal thickening throughout the stomach as can be seen with gastritis. Focal outpouching of contrast along the superior anterior aspect of distal antrum with adjacent mucosal irregularity most concerning for a contained perforated ulcer. No peritoneal spread of contrast. No gastric outlet obstruction. Duodenum is unremarkable. IMPRESSION: 1. Focal outpouching of contrast along the superior anterior aspect of distal antrum with adjacent mucosal irregularity most concerning for a contained perforated ulcer. No peritoneal spread of contrast. 2. Overall findings concerning for underlying gastritis. Electronically Signed   By: Kathreen Devoid   On: 05/19/2021 16:20   [place] EKG SITE RITE  Result Date: 05/25/2021 If Site Rite image not attached, placement could not be confirmed due to current cardiac rhythm.  CT ANGIO GI BLEED  Result Date: 06/04/2021 CLINICAL DATA:  GI bleed EXAM: CTA ABDOMEN AND PELVIS WITHOUT AND  WITH CONTRAST TECHNIQUE: Multidetector CT imaging of the abdomen and pelvis was performed using the standard protocol during bolus administration of intravenous contrast. Multiplanar reconstructed images and MIPs were obtained and reviewed to evaluate the vascular anatomy. CONTRAST:  16mL OMNIPAQUE IOHEXOL 350 MG/ML SOLN COMPARISON:  the previous day's study FINDINGS: VASCULAR Aorta: Moderate calcified atheromatous plaque throughout. 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 No dissection or stenosis. Celiac: Patent without evidence of aneurysm, dissection, vasculitis or significant stenosis. SMA: Partially calcified ostial plaque resulting in 2 cm mild stenosis, patent distally with classic distal branch anatomy. Renals: Single left, heavily calcified proximal without high-grade stenosis. Single right, with irregular partially calcified ostial plaque extending over length of approximately 1.6 cm without high-grade stenosis, patent distally. IMA: Patent without evidence of aneurysm, dissection, vasculitis or significant stenosis. Inflow: Right common iliac artery fusiform dilatation up to 1.8 cm, left 1.7 cm. Moderate calcified atheromatous plaque throughout the iliac arterial systems bilaterally no dissection or stenosis. Proximal Outflow: Atheromatous, patent Veins: Patent hepatic veins, portal vein, SMV, splenic vein. Retroaortic left renal vein, an anatomic variant. Iliac venous system and IVC unremarkable. No venous pathology evident. Review of the MIP images confirms the above findings. NON-VASCULAR Lower chest: Visualized lung bases clear. No pleural or pericardial effusion. Hepatobiliary: Fatty liver without focal lesion. Gallbladder incompletely distended without calcified stones. Pancreas: Unremarkable. No pancreatic ductal dilatation or surrounding inflammatory changes. Spleen: Normal in size without focal abnormality. Adrenals/Urinary Tract: 3.2 cm enhancing hypervascular mid left  renal mass containing coarse calcifications. No urolithiasis or hydronephrosis. Urinary bladder physiologically distended. Stomach/Bowel: Stomach is incompletely distended. There is mucosal hypoenhancement posteriorly in the antrum suggesting ulceration, with process containing small gas bubbles extending adjacent  to the GDA suggesting contained perforation. Small bowel is nondistended. Previous right hemicolectomy. Residual oral contrast material in the decompressed left colon with no convincing evidence of active extravasation. Lymphatic: Enlarged mesenteric node adjacent to the gastric process. No retroperitoneal or pelvic adenopathy. Reproductive: Prostate enlargement with central coarse calcifications. Other: Left pelvic phleboliths.  No ascites.  No free air. Musculoskeletal: Narrowing of the L4-5 interspace with circumferential disc bulge/protrusion. No fracture or worrisome bone lesion. IMPRESSION: 1. Negative for active extravasation into the GI tract. 2. Perforated process in the gastric antrum may represent peptic ulcer disease versus ulcerated neoplasm. This is in proximity to the [place], without pseudoaneurysm or other stigmata of bleeding. Critical Value/emergent results were called by telephone at the time of interpretation on 06/05/2021 at 11:21 am to provider Dr. Dahlia Byes, who verbally acknowledged these results. 3. 3.2 cm left renal mass probably renal cell carcinoma. Urology consult recommended. 4.  Aortic Atherosclerosis (ICD10-170.0). Electronically Signed   By: Lucrezia Europe M.D.   On: 05/13/2021 11:23   CT Angio Abd/Pel W and/or Wo Contrast  Result Date: 06/04/2021 CLINICAL DATA:  70 year old male with a history of GI bleeding EXAM: CTA ABDOMEN AND PELVIS WITHOUT AND WITH CONTRAST TECHNIQUE: Multidetector CT imaging of the abdomen and pelvis was performed using the standard protocol during bolus administration of intravenous contrast. Multiplanar reconstructed images and MIPs were obtained and  reviewed to evaluate the vascular anatomy. CONTRAST:  164mL OMNIPAQUE IOHEXOL 350 MG/ML SOLN COMPARISON:  03/12/2021, 07/20/2020 FINDINGS: VASCULAR Aorta: Diameter of the aorta at the hiatus measures 2.6 cm. Mild atherosclerotic changes of the abdominal aorta. No ulcerated plaque or pedunculated plaque. No dissection. Juxtarenal aorta measures 2.3 cm. Largest diameter of the infrarenal abdominal aorta measures 2.4 cm. Celiac: Celiac artery is patent with mild atherosclerotic changes. Stenosis of the left gastric artery origin. SMA: Moderate atherosclerotic changes at the SMA origin including soft plaque which narrows the lumen beyond the origin by estimated 50%. Branches are patent. Renals: - Right: Mild to moderate atherosclerotic changes at the right renal artery origin without evidence of high-grade stenosis. - Left: Mild to moderate atherosclerotic changes of the left renal artery origin without high-grade stenosis. IMA: IMA is patent Right lower extremity: Unremarkable course, caliber, and contour of the right iliac system. Moderate atherosclerotic changes of the right iliac system without high-grade stenosis or occlusion. No aneurysm, dissection, or occlusion. Hypogastric artery is patent, with atherosclerotic changes. Common femoral artery patent. Proximal SFA and profunda femoris patent. Left lower extremity: Unremarkable course, caliber, and contour of the left iliac system. Moderate atherosclerotic changes of the left iliac system without high-grade stenosis or occlusion. No aneurysm, dissection, or occlusion. Hypogastric artery is patent, with atherosclerotic changes. Common femoral artery patent. Proximal SFA and profunda femoris patent. Veins: Unremarkable appearance of the venous system. Review of the MIP images confirms the above findings. NON-VASCULAR Lower chest: No acute. Hepatobiliary: Diffusely decreased attenuation of liver parenchyma no focal lesions identified. Focal fatty sparing within lower  segment 3. Gallbladder is relatively decompressed. Pancreas: Unremarkable. Spleen: Unremarkable. Adrenals/Urinary Tract: - Right adrenal gland: Unremarkable - Left adrenal gland: Unremarkable. - Right kidney: No hydronephrosis, nephrolithiasis, inflammation, or ureteral dilation. No focal lesion. - Left Kidney: No hydronephrosis, nephrolithiasis, inflammation, or ureteral dilation. Interpolar mass of the lateral left renal cortex estimated 2.9 cm, similar to the prior CT and concerning for renal cell carcinoma. - Urinary Bladder: Urinary bladder is relatively decompressed. Stomach/Bowel: - Stomach: Surgical changes along the lesser curvature of the stomach. Inflammatory changes  in the region of the gastric antrum and pylorus, with decompression of the stomach lumen. There is gas at the posterior aspect of the stomach antrum, separate from the lumen of the stomach in 3 locations, image 66, image 75, and image 71 of series 5. There are enhancing small nodules within the gastrocolic ligament, image 81 of series 5 and image 33 of series 7. Edema within the associated fat. There is a borderline enlarged lymph node measuring 8 mm-9 mm in the pre duodenal region, new from the comparison CT. - Small bowel: Unremarkable. No accumulation of contrast or air-fluid levels. - Appendix: Appendix is not visualized, however, no inflammatory changes are present adjacent to the cecum to indicate an appendicitis. - Colon: Surgical changes of the left colon. No evidence of obstruction. No focal inflammatory changes. No accumulation of contrast. No air-fluid levels. Lymphatic: No inguinal adenopathy. No pelvic adenopathy. No periaortic adenopathy. Lymph node anterior to the duodenum, 8 mm-9 mm. There are small nodules in the gastrocolic ligament, as described above. Small nodules in the gastrohepatic ligament. Mesenteric: No significant free fluid. Reproductive: Transverse diameter of the prostate 37 mm. Other: No hernia.  Musculoskeletal: No evidence of acute fracture. No bony canal narrowing. Degenerative changes of the spine. Degenerative changes of the hips. No aggressive lytic or sclerotic lesions identified. IMPRESSION: CT angiogram is negative for evidence for acute gastrointestinal hemorrhage. Findings of contained perforation at the gastric antrum, concerning for perforated gastric cancer, or alternatively gastric ulcer. There are suspicious small soft tissue implants/nodules within the gastrocolic ligament, gastrohepatic ligament, as well as a borderline pre duodenal lymph node. Surgical evaluation is indicated. These above preliminary results were discussed by telephone at the time of interpretation on 05/08/2021 at 2:40 pm with Dr. Tamala Julian. 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 As previously mentioned, MRI may be useful for further evaluation. Aortic atherosclerosis and mesenteric arterial disease, without evidence of high-grade stenosis. Aortic Atherosclerosis (ICD10-I70.0). Bilateral renal arterial disease without high-grade stenosis. Additional ancillary findings as above. Signed, Dulcy Fanny. Dellia Nims, RPVI [facility] Radiology Electronically Signed   By: Corrie Mckusick D.O.   On: 05/26/2021 14:49   '@[email or web address]'$ @ [place] EKG SITE RITE  Result Date: 06/04/2021 If Site Rite image not attached, placement could not be confirmed due to current cardiac rhythm.  CT ANGIO GI BLEED  Result Date: 05/08/2021 CLINICAL DATA:  GI bleed EXAM: CTA ABDOMEN AND PELVIS WITHOUT AND WITH CONTRAST TECHNIQUE: Multidetector CT imaging of the abdomen and pelvis was performed using the standard protocol during bolus administration of intravenous contrast. Multiplanar reconstructed images and MIPs were obtained and reviewed to evaluate the vascular anatomy. CONTRAST:  162mL OMNIPAQUE IOHEXOL 350 MG/ML SOLN COMPARISON:  the previous day's study FINDINGS: VASCULAR Aorta: Moderate  calcified atheromatous plaque throughout. 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 No dissection or stenosis. Celiac: Patent without evidence of aneurysm, dissection, vasculitis or significant stenosis. SMA: Partially calcified ostial plaque resulting in 2 cm mild stenosis, patent distally with classic distal branch anatomy. Renals: Single left, heavily calcified proximal without high-grade stenosis. Single right, with irregular partially calcified ostial plaque extending over length of approximately 1.6 cm without high-grade stenosis, patent distally. IMA: Patent without evidence of aneurysm, dissection, vasculitis or significant stenosis. Inflow: Right common iliac artery fusiform dilatation up to 1.8 cm, left 1.7 cm. Moderate calcified atheromatous plaque throughout the iliac arterial systems bilaterally no dissection or stenosis. Proximal Outflow: Atheromatous, patent Veins: Patent hepatic veins, portal vein,  SMV, splenic vein. Retroaortic left renal vein, an anatomic variant. Iliac venous system and IVC unremarkable. No venous pathology evident. Review of the MIP images confirms the above findings. NON-VASCULAR Lower chest: Visualized lung bases clear. No pleural or pericardial effusion. Hepatobiliary: Fatty liver without focal lesion. Gallbladder incompletely distended without calcified stones. Pancreas: Unremarkable. No pancreatic ductal dilatation or surrounding inflammatory changes. Spleen: Normal in size without focal abnormality. Adrenals/Urinary Tract: 3.2 cm enhancing hypervascular mid left renal mass containing coarse calcifications. No urolithiasis or hydronephrosis. Urinary bladder physiologically distended. Stomach/Bowel: Stomach is incompletely distended. There is mucosal hypoenhancement posteriorly in the antrum suggesting ulceration, with process containing small gas bubbles extending adjacent to the GDA suggesting contained perforation. Small bowel is nondistended. Previous  right hemicolectomy. Residual oral contrast material in the decompressed left colon with no convincing evidence of active extravasation. Lymphatic: Enlarged mesenteric node adjacent to the gastric process. No retroperitoneal or pelvic adenopathy. Reproductive: Prostate enlargement with central coarse calcifications. Other: Left pelvic phleboliths.  No ascites.  No free air. Musculoskeletal: Narrowing of the L4-5 interspace with circumferential disc bulge/protrusion. No fracture or worrisome bone lesion. IMPRESSION: 1. Negative for active extravasation into the GI tract. 2. Perforated process in the gastric antrum may represent peptic ulcer disease versus ulcerated neoplasm. This is in proximity to the [place], without pseudoaneurysm or other stigmata of bleeding. Critical Value/emergent results were called by telephone at the time of interpretation on 05/23/2021 at 11:21 am to provider Dr. Dahlia Byes, who verbally acknowledged these results. 3. 3.2 cm left renal mass probably renal cell carcinoma. Urology consult recommended. 4.  Aortic Atherosclerosis (ICD10-170.0). Electronically Signed   By: Lucrezia Europe M.D.   On: 05/11/2021 11:23     ASSESSMENT AND PLAN    -Multidisciplinary rounds held today  Acutely unresponsive state   - Due to hemorrhagic shock    -will give IVF and pRBC transfusion     - in preparation for ex-lap    Circulatory shock     Due to GI bleed - present on admission  -GI consult - appreciate input -PRBC transfusion - Hb 6.1 ICU monitoring   GI bleed    - patient with NSAID and PUD/perf viscus hx    - going to OR for ex-lap   - s/p GI EGD/cScope  -unclear if there is etOH hx -hB 6.1 - prBC transfusion    ID -continue IV abx as prescibed -follow up cultures  GI/Nutrition GI PROPHYLAXIS as indicated DIET-->TF's as tolerated Constipation protocol as indicated  ENDO - ICU hypoglycemic\Hyperglycemia protocol -check FSBS per protocol   ELECTROLYTES -follow labs as  needed -replace as needed -pharmacy consultation   DVT/GI PRX ordered -SCDs  TRANSFUSIONS AS NEEDED MONITOR FSBS ASSESS the need for LABS as needed   Critical care provider statement:    Critical care time (minutes):  33   Critical care time was exclusive of:  Separately billable procedures and treating other patients   Critical care was necessary to treat or prevent imminent or life-threatening deterioration of the following conditions:  acutely comatose due to circulatory shock, GI bleed , acute blood loss anemia, multiple comorbid conditions.    Critical care was time spent personally by me on the following activities:  Development of treatment plan with patient or surrogate, discussions with consultants, evaluation of patient's response to treatment, examination of patient, obtaining history from patient or surrogate, ordering and performing treatments and interventions, ordering and review of laboratory studies and re-evaluation of patient's condition.  I assumed direction  of critical care for this patient from another provider in my specialty: no    This document was prepared using Dragon voice recognition software and may include unintentional dictation errors.    Ottie Glazier, M.D.  [facility] [place]

## 2021-05-25 NOTE — Progress Notes (Signed)
Patient arrived from 2A by bed with nurses. Dr. Lanney Gins at bedside. Patient was alert but altered and responding to commands prior to intubation. Medium size Red/maroon bowel movement with clots. Dr. Posey Pronto called patients wife to update her.

## 2021-05-25 NOTE — Anesthesia Procedure Notes (Signed)
Arterial Line Insertion Start/End06/29/2022 10:12 AM, 05/27/2021 10:17 AM Performed by: Arita Miss, MD, anesthesiologist  Patient location: Pre-op. Preanesthetic checklist: patient identified, IV checked, site marked, risks and benefits discussed, surgical consent, monitors and equipment checked, pre-op evaluation, timeout performed and anesthesia consent Patient sedated Left, radial was placed Catheter size: 20 Fr Hand hygiene performed  and maximum sterile barriers used   Attempts: 1 Procedure performed using ultrasound guided technique. Ultrasound Notes:anatomy identified, needle tip was noted to be adjacent to the nerve/plexus identified and no ultrasound evidence of intravascular and/or intraneural injection Following insertion, dressing applied. Post procedure assessment: normal and unchanged  Patient tolerated the procedure well with no immediate complications.

## 2021-05-25 NOTE — Progress Notes (Signed)
See 0830 note- code blue   05/07/2021 0759  Assess: MEWS Score  Temp 97.9 F (36.6 C)  BP (!) 72/55  Pulse Rate (!) 103  Resp (!) 22  SpO2 97 %  Assess: MEWS Score  MEWS Temp 0  MEWS Systolic 2  MEWS Pulse 1  MEWS RR 1  MEWS LOC 0  MEWS Score 4  MEWS Score Color Red  Assess: if the MEWS score is Yellow or Red  Were vital signs taken at a resting state? Yes  Focused Assessment Change from prior assessment (see assessment flowsheet)  Early Detection of Sepsis Score *See Row Information* Medium  MEWS guidelines implemented *See Row Information* Yes  Treat  MEWS Interventions Other (Comment) (MD contacted, 1075mL Bolus, unit of blood initiated)  Pain Scale 0-10  Pain Score 0  Take Vital Signs  Increase Vital Sign Frequency  Red: Q 1hr X 4 then Q 4hr X 4, if remains red, continue Q 4hrs  Escalate  MEWS: Escalate Red: discuss with charge nurse/RN and provider, consider discussing with RRT  Notify: Charge Nurse/RN  Name of Charge Nurse/RN Notified [place]  Date Charge Nurse/RN Notified 05/08/2021  Time Charge Nurse/RN Notified 0800  Notify: Provider  Provider Name/Title Dr. Posey Pronto, Dr. Dahlia Byes, Dr. Marius Ditch  Date Provider Notified 05/09/2021  Time Provider Notified 0800  Notification Type Page  Notification Reason Change in status  Provider response At bedside  Date of Provider Response 05/14/2021  Time of Provider Response [phone]  Document  Patient Outcome Transferred/level of care increased  Progress note created (see row info) Yes

## 2021-05-25 NOTE — Progress Notes (Signed)
Unable to register temperature therefore bair hugger applied. Dr. Dahlia Byes gave order for NG tube to be hooked to Low intermittent suction.

## 2021-05-25 NOTE — Progress Notes (Signed)
Seen and examined. HD labile, dropped hb and change mental status. Now w large melenotic stool. Needs emergent laparotomy. He is currently intubated and has a central line and is receiving blood transfusion.

## 2021-05-25 NOTE — Procedures (Signed)
Central Venous Catheter Placement:TRIPLE LUMEN Indication: Patient receiving vesicant or irritant drug.; Patient receiving intravenous therapy for longer than 5 days.; Patient has limited or no vascular access.   Consent:emergent    Hand washing performed prior to starting the procedure.   Procedure:   An active timeout was performed and correct patient, name, & ID confirmed.   Patient was positioned correctly for central venous access.  Patient was prepped using strict sterile technique including chlorohexadine preps, sterile drape, sterile gown and sterile gloves.    The area was prepped, draped and anesthetized in the usual sterile manner. Patient comfort was obtained.    A triple lumen catheter was placed in Right Internal Jugular Vein There was good blood return, catheter caps were placed on lumens, catheter flushed easily, the line was secured and a sterile dressing and BIO-PATCH applied.   Ultrasound was used to visualize vasculature and guidance of needle.   Number of Attempts: 1 Complications:none Estimated Blood Loss: none Chest Radiograph indicated and ordered.    Ottie Glazier, M.D.  [facility] [place]

## 2021-05-25 NOTE — Progress Notes (Signed)
Dr. Lanney Gins gave order to discontinue insulin drip, CBG 176.

## 2021-05-25 NOTE — Op Note (Signed)
PROCEDURES: 1. Exploratory laparotomy 2. Duodenotomy and ligation of gastroduodenal and pancreaticoduodenal bleeding vessel 3. Heineke-Mikulicz Pyloroplasty 4. Omental pedicle graft to reinforce pyloroplasty 5. Feeding Jejunostomy 18 Fr Red rubber Witzel type 6. Lysis of adhesions taking at least 1 hr of total operative time     Pre-operative Diagnosis:  Massive bleed from duodenal ulcer despite aggressive medical and endoscopic management Hemorrhagic shock  Post-operative Diagnosis: same  Surgeon: Jules Husbands MD FACS  Assistants: Rennis Chris PA-S  Anesthesia: General endotracheal anesthesia  ASA Class: 5  Surgeon: Caroleen Hamman , MD FACS  Anesthesia: Gen. 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 Very complex case due to hemorrhagic shock, recurrent operation for peptic ulcer and massive bleeding.  Estimated Blood Loss: 3lts         Drains: 19 blake Fr drain              Complications: none               Condition: critical  Procedure Details  The patient was seen again in the Holding Room. The benefits, complications, treatment options, and expected outcomes were discussed with the patient. The risks of bleeding, infection, recurrence of symptoms, failure to resolve symptoms,  bowel injury, any of which could require further surgery were reviewed with the patient.   The patient was taken to Operating Room, identified as Timothy Shaffer and the procedure verified.  A Time Out was held and the above information confirmed. Patient have been having recurrent melena with now mental status changes this morning and hypotension.  Aggressive resuscitation was started on he was declared emergency due to rapid decompensation.   Spoke to his wife about it yesterday had a very discussed with him in detail about the procedure and yesterday he underwent an endoscopic clipping and management of the ulcer.  Unfortunately early this morning he had altered mental status with hypotension and another episode of rebleeding. Was emergently taken to the operating room after he was stabilized in the ICU and a central line and definitive airway was placed in the ICU Prior to the induction of general anesthesia, antibiotic prophylaxis was administered. VTE prophylaxis was in place. General endotracheal anesthesia was then administered and tolerated well. After the induction, the abdomen was prepped with Chloraprep and draped in the sterile fashion. The patient was positioned in the supine position. Generous midline laparotomy incision was performed with 10 blade knife.  There were multiple Prolene sutures from a prior laparotomy that were removed.  I use an infraumbilical approach since this was the unviolated portion of his abdominal wall that had no scars.  We entered the abdominal cavity after elevating the peritoneum and incising the peritoneum with Metzenbaum scissors.  There was no evidence of any injuries.  We were able to extend our fascial incision in standard fashion.  Extensive lysis of adhesions were encountered there was some small bowel attached to the abdominal wall and all this adhesions were lysed with Metzenbaum scissors.  His upper abdomen was frozen given that the liver was intimately attached to the peritoneal surface.  Meticulous dissection was performed in order to avoid any major injuries.  There was also significant adhesions given prior duodenal ulcer surgery and the omentum the antrum and duodenum and liver  was all used.  Meticulous dissection was performed in order to avoid any injuries after we were able to restore the anatomy and identified the duodenal bulb.  We were able to mobilize the duodenum using a Kocher maneuver.   The myotomy was performed using electrocautery in a transverse fashion.  There was massive amount of blood that was evacuated from the duodenal bulb.  There was an obvious large posterior wall ulcer and there was a bleeding vessel corresponded to the gastroduodenal artery.  We were able to perform ligation of the duodenal artery in 3 points in the standard fashion and also the pancreatico duodenal artery was ligated.  We performed a meticulous dissection and make sure that we did not incorporate the common bile duct within repair.  We saw the ampulla and visualized golden bile exited the ampulla.  It was definitely a complex repair due to the massive amount blots that we had to evacuate.  Shin throughout the operation was receiving blood products and was on vasopressors to maintain appropriate perfusion. Decided that given his hemodynamic compromise this was not the time to perform any gastric resection or anastomosis. I then decided to identified an area for the feeding jejunostomy.  40 cm from the ligament of Treitz the jejunum was identified and I placed a pursestring suture and perform an enterotomy.  18 French red rubber catheter was placed in the standard fashion and a feeding jejunostomy was placed using the Witzel technique with multiple 2-0 silk sutures.  The catheter was exteriorized in the left lower quadrant in the standard fashion.  I was able to evacuate some more clots within the small bowel. Levaquin a total of 3 L of clot and blood from his gastrointestinal tract.  I once again check the repair of the bleeding vessel.  At this point I closed and perform a pyloroplasty in a Heineke-Mikulicz fashion with multiple interrupted 2-0 silk sutures. The repair was widely patent without any bile leaks.  Confirm the appropriate position of the NG-tube.   A Tongue of omentum was mobilized as a pedicle flap to reinforce the pyloroplasty and secured to the palatoplasty in a 3 quadrant way with multiple silk  sutures A 19 French Blake drain was placed on top of the repair the standard fashion. Abdominal cavity was irrigated with several liters of warm normal saline.  Second look revealed no evidence of any other injuries or any other pathology.  I use liposomal Marcaine with an abdominal block bilaterally under direct visualization and the fascia was closed using the small bite technique with multiple 0 PDS suture in the standard fashion.  Due to the contamination of the operation and enteric contents I decided not to close the skin and the subcutaneous tissue was irrigated and a wet-to-dry was applied.  Needle and laparotomy count were correct and there were no immediate complications  Caroleen Hamman, MD, FACS

## 2021-05-25 NOTE — Anesthesia Preprocedure Evaluation (Signed)
Anesthesia Evaluation  Patient identified by MRN, date of birth, ID band Patient unresponsive  General Assessment Comment: patient intubated Called code blue earlier for hemorrhagic shock. Intubated in the ICU, receiving blood. R IJ centtral line placed by ICU.  Emergent ex lap for bleeding.  Reviewed: Allergy & Precautions, H&P , NPO status , Patient's Chart, lab work & pertinent test results, reviewed documented beta blocker date and time   History of Anesthesia Complications (+) PONV, Family history of anesthesia reaction and history of anesthetic complications  Airway Mallampati: Intubated  TM Distance: >3 FB Neck ROM: full    Dental  (+) Dental Advidsory Given, Edentulous Upper, Edentulous Lower   Pulmonary neg shortness of breath, asthma , neg COPD, neg recent URI, Current Smoker and Patient abstained from smoking.,    breath sounds clear to auscultation       Cardiovascular Exercise Tolerance: Good (-) hypertension(-) angina+ Peripheral Vascular Disease  (-) Past MI and (-) Cardiac Stents Normal cardiovascular exam(-) dysrhythmias (-) Valvular Problems/Murmurs Rhythm:regular Rate:Tachycardia     Neuro/Psych negative neurological ROS  negative psych ROS   GI/Hepatic Neg liver ROS, PUD,   Endo/Other  negative endocrine ROS  Renal/GU negative Renal ROS  negative genitourinary   Musculoskeletal   Abdominal   Peds  Hematology negative hematology ROS (+) anemia ,   Anesthesia Other Findings Past Medical History: No date: AAA (abdominal aortic aneurysm) (HCC) No date: Asthma     Comment:  allergies No date: Complication of anesthesia No date: Family history of adverse reaction to anesthesia     Comment:  PONV 05/21/1992: Perforated duodenal ulcer ([place])     Comment:  Emergency surgery at [facility], [place], [place] No date: PONV (postoperative nausea and vomiting) No date: PUD (peptic ulcer disease)  Known, contained  perforated gastric ulcer.  Reproductive/Obstetrics negative OB ROS                             Anesthesia Physical  Anesthesia Plan  ASA: 5 and emergent  Anesthesia Plan: General   Post-op Pain Management:    Induction: Inhalational  PONV Risk Score and Plan: 2 and 4 or greater and Ondansetron, Dexamethasone and Treatment may vary due to age or medical condition  Airway Management Planned: Oral ETT  Additional Equipment: None, Arterial line and CVP  Intra-op Plan:   Post-operative Plan: Post-operative intubation/ventilation  Informed Consent: I have reviewed the patients History and Physical, chart, labs and discussed the procedure including the risks, benefits and alternatives for the proposed anesthesia with the patient or authorized representative who has indicated his/her understanding and acceptance.     Dental Advisory Given and Consent reviewed with POA  Plan Discussed with: Anesthesiologist, CRNA and Surgeon  Anesthesia Plan Comments: (Discussed risks of anesthesia with patient's wife by phone, such as cardiorespiratory and neurological sequelae, and death. Post op intubation assured. Confirmed patient's full DNR status. She understands.  Blood, FFP, platelets en route.)        Anesthesia Quick Evaluation

## 2021-05-25 NOTE — Progress Notes (Signed)
Spoke with Brittney RN re PICC order.  States pt now has a CVC and no longer needs the PICC line.  Brittney RN to cancel PICC order.

## 2021-05-25 NOTE — Progress Notes (Signed)
Patient taken to OR by bed with Dr. Bertell Maria and other [facility] staff. Levophed drip titrated off prior to going to OR. 2nd unit of blood transfusing and blood bank cooler with 3 units PRBC taken to OR. Dr. Bertell Maria aware that plasma is ready in blood bank and RN made MD aware of Lactic Acid of 7.2 as well as Dr. Lanney Gins was made aware.

## 2021-05-25 NOTE — Progress Notes (Signed)
[facility] Progress Note  Patient: Timothy Shaffer    YIR:[id number]  DOA: 06/05/2021     Date of Service: the patient was seen and examined on 05/26/2021  Code blue note:  Patient become hypotensive earlier, IV normal saline bolus 1 L was infusing.  IV Protonix bolus was infusing.  1 unit of PRBC was infusing.  Plan was to take the patient to the OR for exploratory laparotomy. CODE BLUE was called on 05/22/2021. Patient did not lose pulse, briefly became unresponsive, was awake throughout and is able to follow the commands and answer questions. No CPR performed. Patient was urgently transferred to ICU. Intubated for airway protection.  Become hypotensive requiring central line and initiation of the pressors. I personally talked with wife to inform her about the transfer to the ICU as well as need for the central line and the arterial line.  She is aware of the risk and the benefit of the procedure, as well as the emergent nature of the procedure. Also discussed with general surgery, GI, PCCM, anesthesia. Care transferred to ICU. Highly appreciate assistance of all the consultants and nursing staff in patient's care.  Brief hospital course: Past medical history of cecal polyp SP robotic colectomy 2021, perforated duodenal ulcer SP repair, active smoker, alcohol use, AAA, chronic mass suspicious for RCC, underweight.  6/17 presents with complaints of bright red blood per rectum and melena. GI and general surgery were consulted. CT abdomen shows evidence of gastric perforation which was contained without any active bleeding. 6/18 rapid response with hypotension and active bleeding.  SP 2 L bolus, 2 PRBC transfusion. Underwent urgent colonoscopy and upper GI endoscopy. Large duodenal ulcer with visible vessel nonbleeding, treated with epinephrine and clip.  Biopsies were taken.  No bleeding seen on colonoscopy  Assessment and Plan: 1.  Hemorrhagic shock due to GI bleeding Gastric  perforation likely secondary to peptic ulcer disease Noncompliant with medical treatment, still using NSAIDs. Blood pressure improving after aggressive IV resuscitation and PRBC transfusion. SP 2 PRBC transfusion. Continue IV fluids. Continue PPI drip. Also received Protonix bolus again. GI enzymes with both involved in the care.  Appreciate their assistance. Patient underwent rapid bowel prep and will be going for endoscopic evaluation in OR. May require exploratory laparotomy if bleeding is not controlled. Currently transferring to progressive care unit. May require higher level of care/ICU postprocedure. Remains n.p.o. for now. Currently on IV Zosyn, will continue.  Follow-up on cultures. Given patient's high risk for decompensation with gastric perforation as well as GI bleed causing unresponsive events and hypotension.  Patient remains appropriate for transfer to progressive care unit.   Addendum: Underwent EGD colonoscopy. Colonoscopy did not show any acute area of bleeding. EGD showed large duodenal bulb ulcer with visible blood vessel without any acute bleeding. Injected and clipped. Per GI patient can be monitored on the medical telemetry floor.  And does not require progressive care unit. Clear liquid diet.   Berle Mull 6:48 PM 05/15/2021     2.  Concern for sepsis-ruled out Patient does have perforated viscus and is currently on antibiotics although I do not think the patient is suffering from intra-abdominal infection which is causing hypotension and this most likely hemorrhagic shock. Monitor.   3.  Active smoker Nicotine patch.   4.  Anxiety This appears to be separate from alcohol withdrawal. For now we will have 0.5 mg Ativan every 4 hours as needed dose.   5.  Alcohol use disorder Currently on CIWA protocol. Monitor.  6.  Goals of care conversation. Extensive conversation with the patient regarding his goals of care. Currently wants to remain full code  and wants full aggressive therapy. Patient also is okay with feeding tube for now.   7.  Underweight Placing the patient at high risk for poor outcome given his significant comorbidity and severity of his current presentation. Family and patient aware. Body mass index is 16.6 kg/m.    8.  Hypokalemia, hypomagnesemia. Replaced.   9.  Left renal mass. Patient has seen urology referral to interventional radiology for biopsy of the left renal mass and percutaneous ablation. Follow-up is currently scheduled on 06/03/2021. Not stable for now to consider further work-up inpatient.  Diet: N.p.o. DVT Prophylaxis:   SCDs Start: 05/19/2021 2113    Advance goals of care discussion: Full code  Family Communication: nofamily was present at bedside, at the time of interview.  Discussed with wife on the phone. Patient's wife is healthcare power of attorney, discussion with the patient  Disposition:  Status is: Inpatient  Remains inpatient appropriate because:Hemodynamically unstable  Dispo: The patient is from: Home              Anticipated d/c is to: Home              Patient currently is not medically stable to d/c.   Difficult to place patient No        Subjective: Had 2 episodes of bloody bowel movement.  No abdominal pain.  No nausea no vomiting.  Became hypotensive earlier and felt fatigue and tired as well as had some dizziness.  Reports also headache when he where he is having a bowel movement. Briefly become unresponsive.  After that was somewhat lethargic but still able to follow commands and answer questions.  Physical Exam:  General: Appear in severe distress, no Rash; Oral Mucosa Clear, moist. no Abnormal Neck Mass Or lumps, Conjunctiva normal  Cardiovascular: S1 and S2 Present, no Murmur, Respiratory: good respiratory effort, Bilateral Air entry present and CTA, no Crackles, no wheezes Abdomen: Bowel Sound present, Soft and no tenderness Extremities: no Pedal  edema Neurology: lethargic and oriented to time, place, and person affect appropriate. no new focal deficit Gait not checked due to patient safety concerns  Vitals:   05/13/2021 0447 06/04/2021 0759 05/29/2021 0807 06/01/2021 0845  BP: 100/69 (!) 72/55 (!) 88/61   Pulse: 89 (!) 103 87   Resp: 20 (!) 22    Temp: 98 F (36.7 C) 97.9 F (36.6 C)    TempSrc: Oral Oral    SpO2: 100% 97%  100%  Weight:      Height:        Intake/Output Summary (Last 24 hours) at 05/27/2021 0904 Last data filed at 05/14/2021 0520 Gross per 24 hour  Intake 3229.35 ml  Output 320 ml  Net 2909.35 ml   Filed Weights   05/18/2021 1045 05/15/2021 0444  Weight: 56.7 kg 54 kg    Data Reviewed: I have personally reviewed and interpreted daily labs, tele strips, imaging. I reviewed all nursing notes, pharmacy notes, vitals, pertinent old records I have discussed plan of care as described above with RN and patient/family.  CBC: Recent Labs  Lab 05/27/2021 1048 05/17/2021 1642 05/17/2021 2003 05/13/2021 0045 05/22/2021 0625 05/19/2021 2110 06/04/2021 0259  WBC 14.5*   < > 12.4* 10.1 9.8 13.0* 11.9*  NEUTROABS 12.2*  --   --   --   --   --   --  HGB 11.1*   < > 10.3* 8.8* 8.6* 9.5* 7.6*  HCT 31.8*   < > 29.9* 25.5* 24.9* 27.2* 22.3*  MCV 106.7*   < > 107.6* 108.1* 109.2* 101.1* 102.3*  PLT 184   < > 176 149* 145* 133* 132*   < > = values in this interval not displayed.   Basic Metabolic Panel: Recent Labs  Lab 05/11/2021 1048 05/08/2021 0625 05/22/2021 0259  NA 131* 136 136  K 3.1* 3.8 3.7  CL 100 107 112*  CO2 24 24 20*  GLUCOSE 174* 77 155*  BUN 28* 16 14  CREATININE 0.88 0.71 0.72  CALCIUM 8.2* 7.8* 7.1*  MG 1.4*  --   --   PHOS 3.2  --   --     Studies: [place] EKG SITE RITE  Result Date: 05/30/2021 If Site Rite image not attached, placement could not be confirmed due to current cardiac rhythm.  CT ANGIO GI BLEED  Result Date: 05/12/2021 CLINICAL DATA:  GI bleed EXAM: CTA ABDOMEN AND PELVIS WITHOUT AND WITH  CONTRAST TECHNIQUE: Multidetector CT imaging of the abdomen and pelvis was performed using the standard protocol during bolus administration of intravenous contrast. Multiplanar reconstructed images and MIPs were obtained and reviewed to evaluate the vascular anatomy. CONTRAST:  154mL OMNIPAQUE IOHEXOL 350 MG/ML SOLN COMPARISON:  the previous day's study FINDINGS: VASCULAR Aorta: Moderate calcified atheromatous plaque throughout. 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 No dissection or stenosis. Celiac: Patent without evidence of aneurysm, dissection, vasculitis or significant stenosis. SMA: Partially calcified ostial plaque resulting in 2 cm mild stenosis, patent distally with classic distal branch anatomy. Renals: Single left, heavily calcified proximal without high-grade stenosis. Single right, with irregular partially calcified ostial plaque extending over length of approximately 1.6 cm without high-grade stenosis, patent distally. IMA: Patent without evidence of aneurysm, dissection, vasculitis or significant stenosis. Inflow: Right common iliac artery fusiform dilatation up to 1.8 cm, left 1.7 cm. Moderate calcified atheromatous plaque throughout the iliac arterial systems bilaterally no dissection or stenosis. Proximal Outflow: Atheromatous, patent Veins: Patent hepatic veins, portal vein, SMV, splenic vein. Retroaortic left renal vein, an anatomic variant. Iliac venous system and IVC unremarkable. No venous pathology evident. Review of the MIP images confirms the above findings. NON-VASCULAR Lower chest: Visualized lung bases clear. No pleural or pericardial effusion. Hepatobiliary: Fatty liver without focal lesion. Gallbladder incompletely distended without calcified stones. Pancreas: Unremarkable. No pancreatic ductal dilatation or surrounding inflammatory changes. Spleen: Normal in size without focal abnormality. Adrenals/Urinary Tract: 3.2 cm enhancing hypervascular mid left renal  mass containing coarse calcifications. No urolithiasis or hydronephrosis. Urinary bladder physiologically distended. Stomach/Bowel: Stomach is incompletely distended. There is mucosal hypoenhancement posteriorly in the antrum suggesting ulceration, with process containing small gas bubbles extending adjacent to the GDA suggesting contained perforation. Small bowel is nondistended. Previous right hemicolectomy. Residual oral contrast material in the decompressed left colon with no convincing evidence of active extravasation. Lymphatic: Enlarged mesenteric node adjacent to the gastric process. No retroperitoneal or pelvic adenopathy. Reproductive: Prostate enlargement with central coarse calcifications. Other: Left pelvic phleboliths.  No ascites.  No free air. Musculoskeletal: Narrowing of the L4-5 interspace with circumferential disc bulge/protrusion. No fracture or worrisome bone lesion. IMPRESSION: 1. Negative for active extravasation into the GI tract. 2. Perforated process in the gastric antrum may represent peptic ulcer disease versus ulcerated neoplasm. This is in proximity to the [place], without pseudoaneurysm or other stigmata of bleeding. Critical Value/emergent results were called by telephone at the  time of interpretation on 05/31/2021 at 11:21 am to provider Dr. Dahlia Byes, who verbally acknowledged these results. 3. 3.2 cm left renal mass probably renal cell carcinoma. Urology consult recommended. 4.  Aortic Atherosclerosis (ICD10-170.0). Electronically Signed   By: Lucrezia Europe M.D.   On: 05/31/2021 11:23    Scheduled Meds:  sodium chloride   Intravenous Once   sodium chloride   Intravenous Once   sodium chloride   Intravenous Once   mometasone-formoterol  2 puff Inhalation BID   nicotine  21 mg Transdermal Daily   [START ON 05/27/2021] pantoprazole  40 mg Intravenous Q12H   thiamine injection  100 mg Intravenous Daily   Continuous Infusions:  sodium chloride 100 mL/hr at 05/10/2021 0355   sodium  chloride Stopped (05/29/2021 0356)   sodium chloride     albumin human     fentaNYL     folic acid (FOLVITE) IVPB Stopped (05/16/2021 1720)   norepinephrine     pantoprazole (PROTONIX) IV     pantoprazole 8 mg/hr (05/07/2021 0621)   piperacillin-tazobactam (ZOSYN)  IV 3.375 g (05/31/2021 0525)   sodium chloride     PRN Meds: sodium chloride, acetaminophen, albuterol, LORazepam, LORazepam **OR** LORazepam, morphine injection, ondansetron (ZOFRAN) IV  Time spent: The patient is critically ill with multiple organ systems failure and requires high complexity decision making for assessment and support, frequent evaluation and titration of therapies. Critical Care Time devoted to patient care services described in this note is 35 minutes  Author: Berle Mull, MD [facility] 06/01/2021 9:04 AM  To reach On-call, see care teams to locate the attending and reach out via [email or web address]. Between 7PM-7AM, please contact night-coverage If you still have difficulty reaching the attending provider, please page the [facility] (Director on Call) for [facility] on amion for assistance.

## 2021-05-25 NOTE — Progress Notes (Signed)
Code Blue called patient taken to ICU. Silent prayer outside the room.

## 2021-05-25 NOTE — Transfer of Care (Deleted)
Immediate Anesthesia Transfer of Care Note  Patient: Timothy Shaffer  Procedure(s) Performed: EXPLORATORY LAPAROTOMY WITH OVERSOWING OF BLEEDING ULCER (Abdomen) LYSIS OF ADHESION (Abdomen) FEEDING JEJUNOSTOMY (Abdomen)  Patient Location: PACU  Anesthesia Type:General  Level of Consciousness: sedated  Airway & Oxygen Therapy: Patient Spontanous Breathing and Patient connected to face mask oxygen  Post-op Assessment: Report given to RN and Post -op Vital signs reviewed and stable  Post vital signs: Reviewed and stable  Last Vitals:  Vitals Value Taken Time  BP 114/88 05/17/2021 1440  Temp    Pulse 102 05/29/2021 1456  Resp 12 05/15/2021 1456  SpO2 100 % 05/27/2021 1456  Vitals shown include unvalidated device data.  Last Pain:  Vitals:   05/09/2021 0930  TempSrc: Oral  PainSc:          Complications: No notable events documented.

## 2021-05-26 ENCOUNTER — Encounter: Payer: Self-pay | Admitting: Surgery

## 2021-05-26 DIAGNOSIS — K922 Gastrointestinal hemorrhage, unspecified: Secondary | ICD-10-CM

## 2021-05-26 DIAGNOSIS — E43 Unspecified severe protein-calorie malnutrition: Secondary | ICD-10-CM | POA: Insufficient documentation

## 2021-05-26 LAB — PREPARE FRESH FROZEN PLASMA
Unit division: 0
Unit division: 0
Unit division: 0
Unit division: 0

## 2021-05-26 LAB — PREPARE PLATELET PHERESIS
Unit division: 0
Unit division: 0

## 2021-05-26 LAB — BPAM FFP
Blood Product Expiration Date: 202206242359
Blood Product Expiration Date: 202206242359
Blood Product Expiration Date: 202206242359
Blood Product Expiration Date: 202206242359
Blood Product Expiration Date: 202206242359
Blood Product Expiration Date: 202206242359
ISSUE DATE / TIME: 202206190942
ISSUE DATE / TIME: 202206190942
ISSUE DATE / TIME: 202206191230
ISSUE DATE / TIME: 202206191230
ISSUE DATE / TIME: 202206191230
ISSUE DATE / TIME: 202206191230
Unit Type and Rh: 6200
Unit Type and Rh: 6200
Unit Type and Rh: 6200
Unit Type and Rh: 6200
Unit Type and Rh: 6200
Unit Type and Rh: 6200

## 2021-05-26 LAB — BLOOD GAS, ARTERIAL
Acid-base deficit: 4.5 mmol/L — ABNORMAL HIGH (ref 0.0–2.0)
Bicarbonate: 21.1 mmol/L (ref 20.0–28.0)
FIO2: 0.4
MECHVT: 450 mL
O2 Saturation: 99.6 %
PEEP: 5 cmH2O
Patient temperature: 37
RATE: 16 resp/min
pCO2 arterial: 40 mmHg (ref 32.0–48.0)
pH, Arterial: 7.33 — ABNORMAL LOW (ref 7.350–7.450)
pO2, Arterial: 191 mmHg — ABNORMAL HIGH (ref 83.0–108.0)

## 2021-05-26 LAB — RENAL FUNCTION PANEL
Albumin: 2.2 g/dL — ABNORMAL LOW (ref 3.5–5.0)
Anion gap: 5 (ref 5–15)
BUN: 20 mg/dL (ref 8–23)
CO2: 24 mmol/L (ref 22–32)
Calcium: 7.6 mg/dL — ABNORMAL LOW (ref 8.9–10.3)
Chloride: 111 mmol/L (ref 98–111)
Creatinine, Ser: 1.03 mg/dL (ref 0.61–1.24)
GFR, Estimated: >60 mL/min (ref >60)
Glucose, Bld: 90 mg/dL (ref 70–99)
Phosphorus: 5.2 mg/dL — ABNORMAL HIGH (ref 2.5–4.6)
Potassium: 3.7 mmol/L (ref 3.5–5.1)
Sodium: 140 mmol/L (ref 135–145)

## 2021-05-26 LAB — CBC
HCT: 25.7 % — ABNORMAL LOW (ref 39.0–52.0)
Hemoglobin: 9.1 g/dL — ABNORMAL LOW (ref 13.0–17.0)
MCH: 31.9 pg (ref 26.0–34.0)
MCHC: 35.4 g/dL (ref 30.0–36.0)
MCV: 90.2 fL (ref 80.0–100.0)
Platelets: 92 10*3/uL — ABNORMAL LOW (ref 150–400)
RBC: 2.85 MIL/uL — ABNORMAL LOW (ref 4.22–5.81)
RDW: 14.8 % (ref 11.5–15.5)
WBC: 14.4 10*3/uL — ABNORMAL HIGH (ref 4.0–10.5)
nRBC: 0 % (ref 0.0–0.2)

## 2021-05-26 LAB — BPAM PLATELET PHERESIS
Blood Product Expiration Date: 202206202359
Blood Product Expiration Date: 202206202359
ISSUE DATE / TIME: 202206190942
ISSUE DATE / TIME: 202206191127
Unit Type and Rh: 6200
Unit Type and Rh: 6200

## 2021-05-26 LAB — GLUCOSE, CAPILLARY
Glucose-Capillary: 69 mg/dL — ABNORMAL LOW (ref 70–99)
Glucose-Capillary: 86 mg/dL (ref 70–99)
Glucose-Capillary: 86 mg/dL (ref 70–99)
Glucose-Capillary: 82 mg/dL (ref 70–99)
Glucose-Capillary: 89 mg/dL (ref 70–99)
Glucose-Capillary: 97 mg/dL (ref 70–99)
Glucose-Capillary: 101 mg/dL — ABNORMAL HIGH (ref 70–99)

## 2021-05-26 LAB — TRIGLYCERIDES: Triglycerides: 88 mg/dL (ref <150)

## 2021-05-26 LAB — MAGNESIUM: Magnesium: 1.3 mg/dL — ABNORMAL LOW (ref 1.7–2.4)

## 2021-05-26 MED ORDER — MAGNESIUM SULFATE 4 GM/100ML IV SOLN
4.0000 g | Freq: Once | INTRAVENOUS | Status: AC
  Administered 2021-05-26: 4 g via INTRAVENOUS
  Filled 2021-05-26: qty 100

## 2021-05-26 MED ORDER — FREE WATER
30.0000 mL | Status: DC
  Administered 2021-05-26 – 2021-05-30 (×22): 30 mL

## 2021-05-26 MED ORDER — PIVOT 1.5 CAL PO LIQD
1000.0000 mL | ORAL | Status: DC
  Administered 2021-05-26: 1000 mL
  Filled 2021-05-26: qty 1000

## 2021-05-26 MED ORDER — NOREPINEPHRINE 16 MG/250ML-% IV SOLN
0.0000 ug/min | INTRAVENOUS | Status: DC
  Administered 2021-05-26: 4 ug/min via INTRAVENOUS
  Administered 2021-05-28: 5 ug/min via INTRAVENOUS
  Administered 2021-05-28: 10 ug/min via INTRAVENOUS
  Administered 2021-05-29: 2 ug/min via INTRAVENOUS
  Filled 2021-05-26 (×5): qty 250

## 2021-05-26 NOTE — Plan of Care (Signed)
  Problem: Education: Goal: Knowledge of General Education information will improve Description: Including pain rating scale, medication(s)/side effects and non-pharmacologic comfort measures Outcome: Progressing   Problem: Health Behavior/Discharge Planning: Goal: Ability to manage health-related needs will improve Outcome: Progressing Note: Patient remains sedated and on vent at this time.   Problem: Clinical Measurements: Goal: Ability to maintain clinical measurements within normal limits will improve Outcome: Progressing Goal: Will remain free from infection Outcome: Progressing Goal: Diagnostic test results will improve Outcome: Progressing Goal: Cardiovascular complication will be avoided Outcome: Progressing   Problem: Activity: Goal: Risk for activity intolerance will decrease Outcome: Progressing   Problem: Nutrition: Goal: Adequate nutrition will be maintained Outcome: Progressing Note: Trickle tube feeding started   Problem: Coping: Goal: Level of anxiety will decrease Outcome: Progressing   Problem: Elimination: Goal: Will not experience complications related to bowel motility Outcome: Progressing Goal: Will not experience complications related to urinary retention Outcome: Progressing   Problem: Safety: Goal: Ability to remain free from injury will improve Outcome: Progressing   Problem: Skin Integrity: Goal: Risk for impaired skin integrity will decrease Outcome: Progressing   Problem: Clinical Measurements: Goal: Respiratory complications will improve Outcome: Not Progressing Note: Remains on vent support   Problem: Pain Managment: Goal: General experience of comfort will improve Outcome: Not Progressing Note: Continues with fentanyl drip

## 2021-05-26 NOTE — Progress Notes (Signed)
[place] Hospital Day(s): 3.   Post op day(s): 1 Day Post-Op.   Interval History:  Patient seen and examined No acute events or new complaints overnight.  Patient remains intubated, arouses to stimuli He is on 6 mcg/min Norepinephrine  He does has a slight leukocytosis to 14.4K; likely reactive from OR Hgb down to 9.1 Platelets 92 Renal function remains normal; sCr - 1.03; UO 550 ccs Hypomagnesemia to 1.3 Surgical drain with 680 ccs; serosanguinous He does have NGT to LIS Feeding jejunostomy is capped  Vital signs in last 24 hours: [min-max] current  Temp:  [97.2 F (36.2 C)-98.6 F (37 C)] 98.6 F (37 C) (06/20 0430) Pulse Rate:  [62-139] 66 (06/20 0645) Resp:  [12-122] 16 (06/20 0645) BP: (72-164)/(54-123) 99/69 (06/20 0630) SpO2:  [97 %-100 %] 100 % (06/20 0645) Arterial Line BP: (85-195)/(51-118) 96/52 (06/20 0645) FiO2 (%):  [40 %] 40 % (06/20 0430) Weight:  [58.6 kg] 58.6 kg (06/20 0600)     Height: 5\' 11"  (180.3 cm) Weight: 58.6 kg BMI (Calculated): 18.03   Intake/Output last 2 shifts:  06/19 0701 - 06/20 0700 In: 7061.3 [I.V.:2627.3; YDXAJ:2878; IV MVEHMCNOB:096] Out: 2836 [Urine:550; Drains:680; Blood:2500]   Physical Exam:  Constitutional: intubated, opens eyes to verbal/painful stimuli Respiratory: On ventilator  Cardiovascular: regular rate and sinus rhythm  Gastrointestinal: Soft, unable to assess tenderness, non-distended. Surgical drain in right abdomen, output serosanguinous, feeding jejunostomy in left abdomen (capped) Genitourinary: Foley in place Integumentary: Midline wound healing via secondary intention, no erythema or drainage   Labs:  CBC Latest Ref Rng & Units 05/26/2021 05/17/2021 06/05/2021  WBC 4.0 - 10.5 K/uL 14.4(H) - 11.9(H)  Hemoglobin 13.0 - 17.0 g/dL 9.1(L) 9.2(L) 11.1(L)  Hematocrit 39.0 - 52.0 % 25.7(L) 26.0(L) 31.9(L)  Platelets 150 - 400 K/uL 92(L) - 77(L)   CMP Latest Ref Rng & Units  05/12/2021 05/13/2021 05/09/2021  Glucose 70 - 99 mg/dL 319(H) 188(H) 155(H)  BUN 8 - 23 mg/dL 17 15 14   Creatinine 0.61 - 1.24 mg/dL 0.92 0.70 0.72  Sodium 135 - 145 mmol/L 137 137 136  Potassium 3.5 - 5.1 mmol/L 4.0 3.6 3.7  Chloride 98 - 111 mmol/L 110 113(H) 112(H)  CO2 22 - 32 mmol/L 20(L) 15(L) 20(L)  Calcium 8.9 - 10.3 mg/dL 8.4(L) 6.5(L) 7.1(L)  Total Protein 6.5 - 8.1 g/dL <3.0(L) <3.0(L) -  Total Bilirubin 0.3 - 1.2 mg/dL 0.7 0.7 -  Alkaline Phos 38 - 126 U/L 29(L) 21(L) -  AST 15 - 41 U/L 58(H) 21 -  ALT 0 - 44 U/L 26 10 -    Imaging studies: No new pertinent imaging studies   Assessment/Plan:  70 y.o. male 1 Day Post-Op s/p exploratory laparotomy, duodenotomy and ligation of gastroduodenal and pancreaticoduodenal vessels, Heineke-Mikulicz Pyloroplasty, and placement of feeding jejunostomy for massive bleed from duodenal ulcer with hemorrhagic shock.    - Continue NPO; likely x5 days; he will need UGI study prior to consideration of NGT removal - Continue IVF resuscitation - Continue IV PPI - Continue IV Abx (Zosyn) - Maintain surgical drain; monitor and record output - Capped jejunostomy - Mine wound care: wet-to-dry dressing changes daily + PRN - Monitor leukocytosis - Monitor H&H; transfuse as needed  - Ventilator and vasopressor support per PCCM    All of the above findings and recommendations were discussed with the medical team.   -- Edison Simon, PA-C [facility] 05/26/2021, 7:27 AM [phone] M-F: 7am - 4pm

## 2021-05-26 NOTE — Progress Notes (Signed)
NP notified of decreased urine output of 176ml concentrated urine this shift. Acknowledged, no new orders at this time.

## 2021-05-26 NOTE — Consult Note (Signed)
NAME:  Timothy Shaffer, MRN:  [id number], DOB:  July 19, 1951, LOS: 3 ADMISSION DATE:  05/08/2021, CONSULTATION DATE:  05/26/21 REFERRING MD:  Marlowe Sax MD , CHIEF COMPLAINT:   GI bleed  History of Present Illness:  70 yo M with PMH of AAA, mod persistent-asthma, ADP, PUD, cecal mass, GIB, GERD, pneumothorax s/p chest tube, hx of ex-lap with PUD and perforation in the past, protein cal malnutrition, left renal mass who came in with UGIB s/p GI evalation with EGD/c-Scope requiring clipping intervention of bleeding ulcer with initial response to stable vital signs. Overnight on medica floor patient with clinical deterioration and instability of vital and significant hematochezia leading to unresponsiveness with circulatory shock. Patient was brought in to MICU emergently intubated placed on MV with requirement of pRBC transfusion and norepinephrine infusion as well as central vascular access for rapid ressussitation  Pertinent  Medical History    has a past medical history of AAA (abdominal aortic aneurysm) ([place]), Asthma, Complication of anesthesia, Family history of adverse reaction to anesthesia, Perforated duodenal ulcer ([place]) (05/21/1992), PONV (postoperative nausea and vomiting), and PUD (peptic ulcer disease).   Significant Hospital Events: Including procedures, antibiotic start and stop dates in addition to other pertinent events   6/18 upper endoscopy with clipping 6/19 exploratory laparotomy   Interim History / Subjective:   Remains on the ventilator, on Levophed  Objective   Blood pressure 114/88, pulse 88, temperature (!) 97.2 F (36.2 C), temperature source Axillary, resp. rate (!) 25, height 5\' 11"  (1.803 m), weight 58.6 kg, SpO2 99 %.    Vent Mode: PRVC FiO2 (%):  [30 %-40 %] 30 % Set Rate:  [12 bmp-16 bmp] 16 bmp Vt Set:  [450 mL] 450 mL PEEP:  [5 cmH20] 5 cmH20 Plateau Pressure:  [11 cmH20-15 cmH20] 15 cmH20   Intake/Output Summary (Last 24 hours) at 05/26/2021 0932 Last data  filed at 05/26/2021 0800 Gross per 24 hour  Intake 6686.24 ml  Output 3650 ml  Net 3036.24 ml   Filed Weights   05/10/2021 1045 05/29/2021 0444 05/26/21 0600  Weight: 56.7 kg 54 kg 58.6 kg    Examination: Blood pressure 114/88, pulse 88, temperature (!) 97.2 F (36.2 C), temperature source Axillary, resp. rate (!) 25, height 5\' 11"  (1.803 m), weight 58.6 kg, SpO2 99 %. Gen:      No acute distress HEENT:  EOMI, sclera anicteric Neck:     No masses; no thyromegaly Lungs:    Clear to auscultation bilaterally; normal respiratory effort CV:         Regular rate and rhythm; no murmurs Abd:      + bowel sounds; soft, non-tender; no palpable masses, no distension Ext:    No edema; adequate peripheral perfusion Skin:      Warm and dry; no rash Neuro: alert and oriented x 3 Psych: normal mood and affect   Labs/imaging that I havepersonally reviewed  (right click and "Reselect all SmartList Selections" daily)   BUN/creatinine 20/1.03, hemoglobin 9.1, WBC 14.4 No new imaging  Resolved Hospital Problem list     Assessment & Plan:  Hemorrhagic shock secondary to bleeding ulcer S/p EGD and exploratory laparotomy Remains on Levophed, wean down as tolerated Follow CBC and transfuse as needed N.p.o. for now  Active smoker, alcohol abuse disorder Monitor for withdrawal  Left renal mass. Patient has seen urology referral to interventional radiology for biopsy of the left renal mass and percutaneous ablation. Follow-up is currently scheduled on 06/03/2021. Not stable for now  to consider further work-up inpatient.  Best Practice (right click and "Reselect all SmartList Selections" daily)   Diet/type: NPO Pain/Anxiety/Delirium protocol RASS goal -1 VAP protocol (if indicated): Yes DVT prophylaxis: SCD GI prophylaxis: PPI Glucose control:  not indicated Central venous access:  N/A Arterial line:  N/A Foley:  N/A Mobility:  bed rest  PT consulted: Yes Studies pending: None Culture  data pending:none Last reviewed culture data:today Antibiotics:zosyn Antibiotic de-escalation: no,  continue current rx Stop date: to be determined  Daily labs: requested Code Status:  full code Last date of multidisciplinary goals of care discussion []  ccm prognosis: Life-threating Disposition: remains critically ill, will stay in intensive care        Labs   CBC: Recent Labs  Lab 05/14/2021 1048 05/26/2021 1642 05/09/2021 0259 05/08/2021 0854 05/27/2021 1130 05/08/2021 1452 05/20/2021 2257 05/26/21 0510  WBC 14.5*   < > 11.9* 16.0* 19.9* 11.9*  --  14.4*  NEUTROABS 12.2*  --   --   --  16.4* 10.4*  --   --   HGB 11.1*   < > 7.6* 6.1* 7.1* 11.1* 9.2* 9.1*  HCT 31.8*   < > 22.3* 18.0* 20.8* 31.9* 26.0* 25.7*  MCV 106.7*   < > 102.3* 97.8 93.3 90.4  --  90.2  PLT 184   < > 132* 108* 74*  72* 77*  --  92*   < > = values in this interval not displayed.    Basic Metabolic Panel: Recent Labs  Lab 05/17/2021 1048 05/26/2021 0625 05/21/2021 0259 05/20/2021 0854 05/29/2021 1130 05/26/21 0510  NA 131* 136 136 137 137 140  K 3.1* 3.8 3.7 3.6 4.0 3.7  CL 100 107 112* 113* 110 111  CO2 24 24 20* 15* 20* 24  GLUCOSE 174* 77 155* 188* 319* 90  BUN 28* 16 14 15 17 20   CREATININE 0.88 0.71 0.72 0.70 0.92 1.03  CALCIUM 8.2* 7.8* 7.1* 6.5* 8.4* 7.6*  MG 1.4*  --   --   --   --  1.3*  PHOS 3.2  --   --   --   --  5.2*   GFR: Estimated Creatinine Clearance: 55.3 mL/min (by C-G formula based on SCr of 1.03 mg/dL). Recent Labs  Lab 05/19/2021 1642 05/19/2021 2003 06/05/2021 2121 05/13/2021 0045 05/14/2021 0854 05/22/2021 1130 06/01/2021 1452 05/26/21 0510  PROCALCITON 0.96  --   --   --   --   --   --   --   WBC 12.9*   < >  --    < > 16.0* 19.9* 11.9* 14.4*  LATICACIDVEN 1.6  --  1.2  --  7.2*  --  1.9  --    < > = values in this interval not displayed.    Liver Function Tests: Recent Labs  Lab 05/31/2021 1048 05/21/2021 0854 05/16/2021 1130 06/04/2021 2257 05/26/21 0510  AST 32 21 58*  --   --    ALT 24 10 26   --   --   ALKPHOS 59 21* 29*  --   --   BILITOT 1.3* 0.7 0.7  --   --   PROT 5.2* <3.0* <3.0*  --   --   ALBUMIN 3.2* 1.3* 1.5* 2.7* 2.2*   No results for input(s): LIPASE, AMYLASE in the last 168 hours. No results for input(s): AMMONIA in the last 168 hours.  ABG    Component Value Date/Time   PHART 7.33 (L) 05/26/2021 0500   PCO2ART  40 05/26/2021 0500   PO2ART 191 (H) 05/26/2021 0500   HCO3 21.1 05/26/2021 0500   ACIDBASEDEF 4.5 (H) 05/26/2021 0500   O2SAT 99.6 05/26/2021 0500     Coagulation Profile: Recent Labs  Lab 05/18/2021 1048 05/29/2021 0920 05/11/2021 1130  INR 1.0 1.5* 1.6*    Cardiac Enzymes: No results for input(s): CKTOTAL, CKMB, CKMBINDEX, TROPONINI in the last 168 hours.  HbA1C: Hgb A1c MFr Bld  Date/Time Value Ref Range Status  07/08/2020 12:01 PM 5.0 4.8 - 5.6 % Final    Comment:    (NOTE) Pre diabetes:          5.7%-6.4%  Diabetes:              >6.4%  Glycemic control for   <7.0% adults with diabetes     CBG: Recent Labs  Lab 05/21/2021 1922 05/07/2021 2337 05/26/21 0421 05/26/21 0646 05/26/21 0730  GLUCAP 105* 95 69* 86 86    Review of Systems:   Unable to obtain due to altered mental state  Past Medical History:  He,  has a past medical history of AAA (abdominal aortic aneurysm) ([place]), Asthma, Complication of anesthesia, Family history of adverse reaction to anesthesia, Perforated duodenal ulcer ([place]) (05/21/1992), PONV (postoperative nausea and vomiting), and PUD (peptic ulcer disease).   Surgical History:   Past Surgical History:  Procedure Laterality Date   COLONOSCOPY  04/2020   COLONOSCOPY WITH PROPOFOL N/A 05/17/2021   Procedure: COLONOSCOPY WITH PROPOFOL;  Surgeon: Lin Landsman, MD;  Location: [facility];  Service: Gastroenterology;  Laterality: N/A;   ESOPHAGOGASTRODUODENOSCOPY (EGD) WITH PROPOFOL N/A 06/04/2021   Procedure: ESOPHAGOGASTRODUODENOSCOPY (EGD) WITH PROPOFOL;  Surgeon: Lin Landsman, MD;   Location: [facility];  Service: Gastroenterology;  Laterality: N/A;   MASS EXCISION Left 04/24/2014   Procedure: LEFT LONG & RING FINGERS DISTAL INTERPHALANGEAL JOINT/CYST EXCISION;  Surgeon: Cammie Sickle., MD;  Location: [facility];  Service: Orthopedics;  Laterality: Left;   REPAIR OF PERFORATED ULCER  1993   MCV, [place], [place]     Social History:   reports that he has been smoking cigarettes. He has a 7.50 pack-year smoking history. He has never used smokeless tobacco. He reports current alcohol use of about 12.0 standard drinks of alcohol per week. He reports current drug use. Drug: Marijuana.   Family History:  His family history includes Breast cancer in his daughter; Gallbladder disease in his sister.   Allergies Allergies  Allergen Reactions   Nsaids     History of perforated ulcer     Home Medications  Prior to Admission medications   Medication Sig Start Date End Date Taking? Authorizing Provider  albuterol (VENTOLIN HFA) 108 (90 Base) MCG/ACT inhaler Inhale 1-2 puffs into the lungs every 6 (six) hours as needed for wheezing or shortness of breath.   Yes [provider]  budesonide-formoterol (SYMBICORT) 80-4.5 MCG/ACT inhaler 2 puffs Patient not taking: Reported on 05/27/2021 02/19/21   [provider]     Critical care time:    The patient is critically ill with multiple organ system failure and requires high complexity decision making for assessment and support, frequent evaluation and titration of therapies, advanced monitoring, review of radiographic studies and interpretation of complex data.   Critical Care Time devoted to patient care services, exclusive of separately billable procedures, described in this note is 45 minutes.   Marshell Garfinkel MD [facility] See Amion for pager  If no response to  pager , please call [phone] until 7pm After 7:00 pm call Elink  [phone] 05/26/2021, 10:02 AM

## 2021-05-26 NOTE — Progress Notes (Signed)
[facility] for Electrolyte Monitoring and Replacement   Recent Labs: Potassium (mmol/L)  Date Value  05/26/2021 3.7   Magnesium (mg/dL)  Date Value  05/26/2021 1.3 (L)   Calcium (mg/dL)  Date Value  05/26/2021 7.6 (L)   Albumin (g/dL)  Date Value  05/26/2021 2.2 (L)   Phosphorus (mg/dL)  Date Value  05/26/2021 5.2 (H)   Sodium (mmol/L)  Date Value  05/26/2021 140   Corrected Ca: 9.04 mg/dL  Assessment: 70 y.o. 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 Pharmacy is asked to follow electrolytes and replace while in the CCU.  Goal of Therapy:  Electrolytes WNL  Plan:  4 grams IV magnesium sulfate x 1 Recheck electrolytes in am  Dallie Piles ,PharmD Clinical Pharmacist 05/26/2021 1:24 PM

## 2021-05-26 NOTE — Progress Notes (Signed)
Initial Nutrition Assessment  DOCUMENTATION CODES:   Severe malnutrition in context of social or environmental circumstances  INTERVENTION:   Initiate Pivot 1.5@20ml /hr- Once tolerating, advance by 47ml/hr q 12 hours until goal rate of 28ml/hr is reached.   Pro-Source 4ml daily via tube, provides 40kcal and 11g of protein per serving   Free water flushes 75ml q4 hours to maintain tube patency   Regimen at goal provides 1480kcal/day, 101g/day protein and 983ml/day of free water  Pt at high refeed risk; recommend monitor potassium, magnesium and phosphorus labs daily until stable  Vitamin C 250mg  BID via tube   Liquid MVI daily via tube   NUTRITION DIAGNOSIS:   Severe Malnutrition related to social / environmental circumstances (chronic poor oral intake, etoh abuse) as evidenced by severe fat depletion, severe muscle depletion.  GOAL:   Provide needs based on ASPEN/SCCM guidelines  MONITOR:   Labs, Weight trends, Skin, I & O's, Vent status, TF tolerance  REASON FOR ASSESSMENT:   Ventilator    ASSESSMENT:   70 y.o. 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  Pt s/p EGD and colonoscopy 6/18 Pt s/p Jejunostomy tube 18 Fr Red rubber Witzel type- 6/19  Pt sedated and ventilated. OGT to LIS with 17ml output. J-tube in place (40cm from the ligament of treitz); plan is to start trickle feeds today. JP drain in place with 69ml output. Pt's daughter at bedside reports pt with poor appetite and oral intake at baseline; pt does drink Ensure supplements  at home. Per chart, pt appears weight stable pta.   Medications reviewed and include: nicotine, protonix, thiamine, NaCl @75ml /hr, fentanyl, folic acid, Mg sulfate, levophed, protonix, zosyn, propofol   Labs reviewed: K 3.7 wnl, P 5.2(H), Mg 1.3(L) Wbc- 14.4(H), Hgb 9.1(L), Hct 25.7(L)  Patient is currently intubated on ventilator support MV: 7.3 L/min Temp (24hrs), Avg:97.5 F (36.4 C), Min:97.2 F (36.2 C), Max:98.6 F (37 C)  Propofol: 6.48 ml/hr- provides 171kcal/day   MAP- >31mmHg   UOP- 523ml  NUTRITION - FOCUSED PHYSICAL EXAM:  Flowsheet Row Most Recent Value  Orbital Region Mild depletion  Upper Arm Region Severe depletion  Thoracic and Lumbar Region Severe depletion  Buccal Region Mild depletion  Temple Region Moderate depletion  Clavicle Bone Region Moderate depletion  Clavicle and Acromion Bone Region Moderate depletion  Scapular Bone Region Severe depletion  Dorsal Hand Severe depletion  Patellar Region Severe depletion  Anterior Thigh Region Severe depletion  Posterior Calf Region Severe depletion  Edema (RD Assessment) None  Hair Reviewed  Eyes Reviewed  Mouth Reviewed  Skin Reviewed  Nails Reviewed      Diet Order:   Diet Order             Diet NPO time specified  Diet effective now                  EDUCATION NEEDS:   No education needs have been identified at this time  Skin:  Skin Assessment: Reviewed RN Assessment (incision abdomen)  Last BM:  6/19  Height:   Ht Readings from Last 1 Encounters:  05/21/2021 5\' 11"  (1.803 m)    Weight:  Wt Readings from Last 1 Encounters:  05/26/21 58.6 kg    Ideal Body Weight:  78.18 kg  BMI:  Body mass index is 18.02 kg/m.  Estimated Nutritional Needs:   Kcal:  1515kcal/day  Protein:  95-105g/day  Fluid:  1.5-1.7L/day  Koleen Distance MS, RD, LDN Please refer to [facility] for RD and/or RD on-call/weekend/after hours pager

## 2021-05-27 DIAGNOSIS — K3189 Other diseases of stomach and duodenum: Secondary | ICD-10-CM | POA: Diagnosis not present

## 2021-05-27 LAB — BPAM FFP
Blood Product Expiration Date: 202206242359
Blood Product Expiration Date: 202206242359
Blood Product Expiration Date: 202206242359
Blood Product Expiration Date: 202206242359
ISSUE DATE / TIME: 202206191230
ISSUE DATE / TIME: 202206191230
ISSUE DATE / TIME: 202206191230
ISSUE DATE / TIME: 202206191230
Unit Type and Rh: 6200
Unit Type and Rh: 6200
Unit Type and Rh: 6200
Unit Type and Rh: 6200

## 2021-05-27 LAB — PREPARE FRESH FROZEN PLASMA
Unit division: 0
Unit division: 0

## 2021-05-27 LAB — BPAM RBC
Blood Product Expiration Date: 202206222359
Blood Product Expiration Date: 202207162359
Blood Product Expiration Date: 202207162359
Blood Product Expiration Date: 202207202359
Blood Product Expiration Date: 202207202359
Blood Product Expiration Date: 202207202359
Blood Product Expiration Date: 202207202359
Blood Product Expiration Date: 202207202359
Blood Product Expiration Date: 202207202359
Blood Product Expiration Date: 202207202359
Blood Product Expiration Date: 202207202359
ISSUE DATE / TIME: 202206181148
ISSUE DATE / TIME: 202206181620
ISSUE DATE / TIME: 202206190818
ISSUE DATE / TIME: 202206190907
ISSUE DATE / TIME: 202206190907
ISSUE DATE / TIME: 202206190907
ISSUE DATE / TIME: 202206190907
ISSUE DATE / TIME: 202206191127
ISSUE DATE / TIME: 202206191127
ISSUE DATE / TIME: 202206191127
ISSUE DATE / TIME: 202206191127
Unit Type and Rh: 6200
Unit Type and Rh: 6200
Unit Type and Rh: 6200
Unit Type and Rh: 6200
Unit Type and Rh: 6200
Unit Type and Rh: 6200
Unit Type and Rh: 6200
Unit Type and Rh: 6200
Unit Type and Rh: 6200
Unit Type and Rh: 6200
Unit Type and Rh: 6200

## 2021-05-27 LAB — PHOSPHORUS: Phosphorus: 3 mg/dL (ref 2.5–4.6)

## 2021-05-27 LAB — TYPE AND SCREEN
ABO/RH(D): A POS
Antibody Screen: NEGATIVE
Unit division: 0
Unit division: 0
Unit division: 0
Unit division: 0
Unit division: 0
Unit division: 0
Unit division: 0
Unit division: 0
Unit division: 0
Unit division: 0
Unit division: 0

## 2021-05-27 LAB — CBC
HCT: 24.6 % — ABNORMAL LOW (ref 39.0–52.0)
Hemoglobin: 8.4 g/dL — ABNORMAL LOW (ref 13.0–17.0)
MCH: 31.7 pg (ref 26.0–34.0)
MCHC: 34.1 g/dL (ref 30.0–36.0)
MCV: 92.8 fL (ref 80.0–100.0)
Platelets: 106 10*3/uL — ABNORMAL LOW (ref 150–400)
RBC: 2.65 MIL/uL — ABNORMAL LOW (ref 4.22–5.81)
RDW: 15.3 % (ref 11.5–15.5)
WBC: 13 10*3/uL — ABNORMAL HIGH (ref 4.0–10.5)
nRBC: 0 % (ref 0.0–0.2)

## 2021-05-27 LAB — GLUCOSE, CAPILLARY
Glucose-Capillary: 106 mg/dL — ABNORMAL HIGH (ref 70–99)
Glucose-Capillary: 109 mg/dL — ABNORMAL HIGH (ref 70–99)
Glucose-Capillary: 93 mg/dL (ref 70–99)
Glucose-Capillary: 97 mg/dL (ref 70–99)
Glucose-Capillary: 88 mg/dL (ref 70–99)
Glucose-Capillary: 85 mg/dL (ref 70–99)

## 2021-05-27 LAB — BASIC METABOLIC PANEL
Anion gap: 4 — ABNORMAL LOW (ref 5–15)
BUN: 18 mg/dL (ref 8–23)
CO2: 27 mmol/L (ref 22–32)
Calcium: 7.6 mg/dL — ABNORMAL LOW (ref 8.9–10.3)
Chloride: 113 mmol/L — ABNORMAL HIGH (ref 98–111)
Creatinine, Ser: 0.84 mg/dL (ref 0.61–1.24)
GFR, Estimated: >60 mL/min (ref >60)
Glucose, Bld: 123 mg/dL — ABNORMAL HIGH (ref 70–99)
Potassium: 3.7 mmol/L (ref 3.5–5.1)
Sodium: 144 mmol/L (ref 135–145)

## 2021-05-27 LAB — MAGNESIUM: Magnesium: 2.2 mg/dL (ref 1.7–2.4)

## 2021-05-27 MED ORDER — ADULT MULTIVITAMIN LIQUID CH
15.0000 mL | Freq: Every day | ORAL | Status: DC
  Administered 2021-05-29 – 2021-05-31 (×3): 15 mL
  Filled 2021-05-27 (×4): qty 15

## 2021-05-27 MED ORDER — HEPARIN SODIUM (PORCINE) 5000 UNIT/ML IJ SOLN
5000.0000 [IU] | Freq: Three times a day (TID) | INTRAMUSCULAR | Status: DC
  Administered 2021-05-27 – 2021-05-29 (×4): 5000 [IU] via SUBCUTANEOUS
  Filled 2021-05-27 (×4): qty 1

## 2021-05-27 MED ORDER — PROSOURCE TF PO LIQD
45.0000 mL | Freq: Every day | ORAL | Status: DC
  Administered 2021-05-29 – 2021-05-31 (×3): 45 mL
  Filled 2021-05-27 (×4): qty 45

## 2021-05-27 MED ORDER — PIVOT 1.5 CAL PO LIQD
1000.0000 mL | ORAL | Status: DC
  Administered 2021-05-27 – 2021-05-31 (×4): 1000 mL
  Filled 2021-05-27: qty 1000

## 2021-05-27 MED ORDER — ASCORBIC ACID 500 MG PO TABS
250.0000 mg | ORAL_TABLET | Freq: Two times a day (BID) | ORAL | Status: DC
  Administered 2021-05-27 – 2021-05-31 (×7): 250 mg
  Filled 2021-05-27 (×8): qty 1

## 2021-05-27 NOTE — Progress Notes (Signed)
[facility] Hospital Day(s): 4.   Post op day(s): 2 Days Post-Op.   Interval History:  Patient seen and examined No acute events or new complaints overnight.  Patient remains intubated, eyes open; does not participate/follow commands He is on 1.5 mcg/min Norepinephrine Leukocytosis improving; 13.0K; likely reactive from OR Hgb down to 9.4; no signs of bleeding currently  Platelets up to 106 Renal function remains normal; sCr - 0.84; UO - 650 ccs No significant electrolyte derangements this morning  Surgical drain with 345 ccs; serosanguinous He does have NGT to LIS Started on enteric feedings through jejunostomy yesterday (06/20); 20 ml/hr  Vital signs in last 24 hours: [min-max] current  Temp:  [97.2 F (36.2 C)-99.2 F (37.3 C)] 99.2 F (37.3 C) (06/21 0400) Pulse Rate:  [59-88] 69 (06/21 0600) Resp:  [16-25] 16 (06/21 0600) BP: (85-123)/(57-88) 91/61 (06/21 0600) SpO2:  [94 %-100 %] 97 % (06/21 0600) Arterial Line BP: (101-151)/(45-72) 102/46 (06/21 0600) FiO2 (%):  [30 %-35 %] 30 % (06/21 0314)     Height: 5\' 11"  (180.3 cm) Weight: 58.6 kg BMI (Calculated): 18.03   Intake/Output last 2 shifts:  06/20 0701 - 06/21 0700 In: 3003.8 [I.V.:2420.9; NG/GT:382.7; IV Piggyback:200.2] Out: 1894 [Urine:649; Emesis/NG output:900; Drains:345]   Physical Exam:  Constitutional: intubated, eyes open Respiratory: On ventilator  Cardiovascular: regular rate and sinus rhythm  Gastrointestinal: Soft, unable to assess tenderness, non-distended. Surgical drain in right abdomen, output serosanguinous, feeding jejunostomy in left abdomen; tube feedings Genitourinary: Foley in place Integumentary: Midline wound healing via secondary intention, no erythema or drainage   Labs:  CBC Latest Ref Rng & Units 05/27/2021 05/26/2021 06/04/2021  WBC 4.0 - 10.5 K/uL 13.0(H) 14.4(H) -  Hemoglobin 13.0 - 17.0 g/dL 8.4(L) 9.1(L) 9.2(L)  Hematocrit 39.0 - 52.0 %  24.6(L) 25.7(L) 26.0(L)  Platelets 150 - 400 K/uL 106(L) 92(L) -   CMP Latest Ref Rng & Units 05/27/2021 05/26/2021 05/11/2021  Glucose 70 - 99 mg/dL 123(H) 90 319(H)  BUN 8 - 23 mg/dL 18 20 17   Creatinine 0.61 - 1.24 mg/dL 0.84 1.03 0.92  Sodium 135 - 145 mmol/L 144 140 137  Potassium 3.5 - 5.1 mmol/L 3.7 3.7 4.0  Chloride 98 - 111 mmol/L 113(H) 111 110  CO2 22 - 32 mmol/L 27 24 20(L)  Calcium 8.9 - 10.3 mg/dL 7.6(L) 7.6(L) 8.4(L)  Total Protein 6.5 - 8.1 g/dL - - <3.0(L)  Total Bilirubin 0.3 - 1.2 mg/dL - - 0.7  Alkaline Phos 38 - 126 U/L - - 29(L)  AST 15 - 41 U/L - - 58(H)  ALT 0 - 44 U/L - - 26    Imaging studies: No new pertinent imaging studies   Assessment/Plan:  70 y.o. male 2 Days Post-Op s/p exploratory laparotomy, duodenotomy and ligation of gastroduodenal and pancreaticoduodenal vessels, Heineke-Mikulicz Pyloroplasty, and placement of feeding jejunostomy for massive bleed from duodenal ulcer with hemorrhagic shock.    - Continue NPO; likely x5 days; he will need UGI study prior to consideration of NGT removal; anticipate Friday 06/24  - Okay to continue enteric feeding via jejunostomy; monitor bowel function; advance slowly - Continue IVF resuscitation - Continue IV PPI - Continue IV Abx (Zosyn) - Maintain surgical drain; monitor and record output - Mine wound care: wet-to-dry dressing changes daily + PRN - Monitor leukocytosis - Monitor H&H; transfuse as needed  - Ventilator and vasopressor support per PCCM    All of the above findings and recommendations were discussed  with the medical team.   -- Edison Simon, PA-C [facility] 05/27/2021, 7:17 AM [phone] M-F: 7am - 4pm

## 2021-05-27 NOTE — Progress Notes (Signed)
NAME:  Timothy Shaffer, MRN:  [id number], DOB:  Dec 17, 1950, LOS: 4 ADMISSION DATE:  05/27/2021, CONSULTATION DATE:  05/26/21 REFERRING MD:  Marlowe Sax MD , CHIEF COMPLAINT:   GI bleed  History of Present Illness:  70 yo M with PMH of AAA, mod persistent-asthma, ADP, PUD, cecal mass, GIB, GERD, pneumothorax s/p chest tube, hx of ex-lap with PUD and perforation in the past, protein cal malnutrition, left renal mass who came in with UGIB s/p GI evalation with EGD/c-Scope requiring clipping intervention of bleeding ulcer with initial response to stable vital signs. Overnight on medica floor patient with clinical deterioration and instability of vital and significant hematochezia leading to unresponsiveness with circulatory shock. Patient was brought in to MICU emergently intubated placed on MV with requirement of pRBC transfusion and norepinephrine infusion as well as central vascular access for rapid ressussitation  Pertinent  Medical History    has a past medical history of AAA (abdominal aortic aneurysm) ([place]), Asthma, Complication of anesthesia, Family history of adverse reaction to anesthesia, Perforated duodenal ulcer ([place]) (05/21/1992), PONV (postoperative nausea and vomiting), and PUD (peptic ulcer disease).   Significant Hospital Events: Including procedures, antibiotic start and stop dates in addition to other pertinent events   6/18 upper endoscopy with clipping 6/19 exploratory laparotomy  6/20 remains on Levophed, started on feeds through jejunostomy tube  Interim History / Subjective:   Remains on Levophed, started on feeds through jejunostomy tube  Objective   Blood pressure 99/63, pulse 79, temperature 99.2 F (37.3 C), temperature source Oral, resp. rate 16, height 5\' 11"  (1.803 m), weight 58.6 kg, SpO2 97 %.    Vent Mode: PRVC FiO2 (%):  [30 %] 30 % Set Rate:  [16 bmp] 16 bmp Vt Set:  [450 mL] 450 mL PEEP:  [5 cmH20] 5 cmH20 Plateau Pressure:  [14 cmH20-15 cmH20] 15 cmH20    Intake/Output Summary (Last 24 hours) at 05/27/2021 0832 Last data filed at 05/27/2021 0805 Gross per 24 hour  Intake 2774.33 ml  Output 1924 ml  Net 850.33 ml   Filed Weights   05/26/2021 1045 05/19/2021 0444 05/26/21 0600  Weight: 56.7 kg 54 kg 58.6 kg    Examination: Blood pressure 99/63, pulse 79, temperature 99.2 F (37.3 C), temperature source Oral, resp. rate 16, height 5\' 11"  (1.803 m), weight 58.6 kg, SpO2 97 %. Gen:      No acute distress, frail, elderly HEENT:  EOMI, sclera anicteric Neck:     No masses; no thyromegaly, ETT Lungs:    Clear to auscultation bilaterally; normal respiratory effort CV:         Regular rate and rhythm; no murmurs Abd:      Mid abdominal surgical incision clean and dressed Ext:    No edema; adequate peripheral perfusion Skin:      Warm and dry; no rash Neuro: Somnolent, arousable  Labs/imaging that I havepersonally reviewed  (right click and "Reselect all SmartList Selections" daily)   BUN/creatinine 18/0.84, WBC 13, hemoglobin lower at 8.4  Resolved Hospital Problem list     Assessment & Plan:  Hemorrhagic shock secondary to bleeding ulcer S/p EGD and exploratory laparotomy Remains on Levophed, wean down as tolerated Follow CBC and transfuse as needed N.p.o. for now. Tube feeds via J tube  Active smoker, alcohol abuse disorder Monitor for withdrawal  Left renal mass. Patient has seen urology referral to interventional radiology for biopsy of the left renal mass and percutaneous ablation. Follow-up is currently scheduled on 06/03/2021. Not stable  for now to consider further work-up inpatient when stabilized  Best Practice (right click and "Reselect all SmartList Selections" daily)   Diet/type: tubefeeds Pain/Anxiety/Delirium protocol RASS goal -1 VAP protocol (if indicated): Yes DVT prophylaxis: prophylactic heparin  GI prophylaxis: PPI Glucose control:  not indicated Central venous access:  Yes, and it is still  needed Arterial line:  N/A and Yes, and it is still needed Foley:  N/A and Yes, and it is still needed Mobility:  bed rest  PT consulted: Yes Studies pending: None Culture data pending:none Last reviewed culture data:today Antibiotics:zosyn Antibiotic de-escalation: no,  continue current rx Stop date: to be determined  Daily labs: requested Code Status:  full code Last date of multidisciplinary goals of care discussion []  ccm prognosis: Life-threating Disposition: remains critically ill, will stay in intensive care  Critical care time:    The patient is critically ill with multiple organ system failure and requires high complexity decision making for assessment and support, frequent evaluation and titration of therapies, advanced monitoring, review of radiographic studies and interpretation of complex data.   Critical Care Time devoted to patient care services, exclusive of separately billable procedures, described in this note is 35 minutes.   Marshell Garfinkel MD [facility] See Amion for pager  If no response to pager , please call [phone] until 7pm After 7:00 pm call Elink  [phone] 05/27/2021, 8:45 AM

## 2021-05-27 NOTE — Progress Notes (Signed)
[facility] for Electrolyte Monitoring and Replacement   Recent Labs: Potassium (mmol/L)  Date Value  05/27/2021 3.7   Magnesium (mg/dL)  Date Value  05/27/2021 2.2   Calcium (mg/dL)  Date Value  05/27/2021 7.6 (L)   Albumin (g/dL)  Date Value  05/26/2021 2.2 (L)   Phosphorus (mg/dL)  Date Value  05/27/2021 3.0   Sodium (mmol/L)  Date Value  05/27/2021 144   Corrected Ca: 9.04 mg/dL  Assessment: 70 y.o. 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 Pharmacy is asked to follow electrolytes and replace while in the CCU.  Phos 5.2>3 Mg 1.3>2.2 (4g IV x1; 6/20)  Goal of Therapy:  Electrolytes WNL  Plan:  Lytes WNL; no further replacement warranted for today. Recheck electrolytes in AM  Lorna Dibble ,PharmD Clinical Pharmacist 05/27/2021 9:30 AM

## 2021-05-27 NOTE — Progress Notes (Signed)
Levo stopped @ 9643. BP stable off levo throughout day.   Pt remains on prop and fent.   This AM, SAT/SBT attempted. Pt following commands and remains relaxed/nods appropriately to simple questions. Pt failed SBT, sats in 70s-80s. Pt returned to [facility]    Foley, JP, and G-tube remain in place.   JP putting out bloody/serosanguinous output.   Tube feeds infusing via G-tube, rate increased to 40 ml/hr per nutrition. Increase to 50 ml/hr @ 0200 on 6/22 of pt continues to tolerate feeds.   Wife at bedside. Turn q2. Pt in no acute distress @ this time. Will continue to monitor.

## 2021-05-28 ENCOUNTER — Other Ambulatory Visit (INDEPENDENT_AMBULATORY_CARE_PROVIDER_SITE_OTHER): Payer: Self-pay | Admitting: Vascular Surgery

## 2021-05-28 ENCOUNTER — Encounter: Admission: EM | Disposition: E | Payer: Self-pay | Source: Home / Self Care | Attending: Pulmonary Disease

## 2021-05-28 DIAGNOSIS — K922 Gastrointestinal hemorrhage, unspecified: Secondary | ICD-10-CM | POA: Diagnosis not present

## 2021-05-28 DIAGNOSIS — R578 Other shock: Secondary | ICD-10-CM

## 2021-05-28 DIAGNOSIS — K254 Chronic or unspecified gastric ulcer with hemorrhage: Secondary | ICD-10-CM

## 2021-05-28 HISTORY — PX: EMBOLIZATION: CATH118239

## 2021-05-28 LAB — BASIC METABOLIC PANEL
Anion gap: 2 — ABNORMAL LOW (ref 5–15)
BUN: 16 mg/dL (ref 8–23)
CO2: 28 mmol/L (ref 22–32)
Calcium: 7.7 mg/dL — ABNORMAL LOW (ref 8.9–10.3)
Chloride: 115 mmol/L — ABNORMAL HIGH (ref 98–111)
Creatinine, Ser: 0.76 mg/dL (ref 0.61–1.24)
GFR, Estimated: >60 mL/min (ref >60)
Glucose, Bld: 153 mg/dL — ABNORMAL HIGH (ref 70–99)
Potassium: 3.1 mmol/L — ABNORMAL LOW (ref 3.5–5.1)
Sodium: 145 mmol/L (ref 135–145)

## 2021-05-28 LAB — GLUCOSE, CAPILLARY
Glucose-Capillary: 130 mg/dL — ABNORMAL HIGH (ref 70–99)
Glucose-Capillary: 132 mg/dL — ABNORMAL HIGH (ref 70–99)
Glucose-Capillary: 138 mg/dL — ABNORMAL HIGH (ref 70–99)
Glucose-Capillary: 138 mg/dL — ABNORMAL HIGH (ref 70–99)
Glucose-Capillary: 191 mg/dL — ABNORMAL HIGH (ref 70–99)

## 2021-05-28 LAB — BLOOD GAS, ARTERIAL
Acid-base deficit: 2.3 mmol/L — ABNORMAL HIGH (ref 0.0–2.0)
Bicarbonate: 23.7 mmol/L (ref 20.0–28.0)
FIO2: 0.3
MECHVT: 450 mL
Mechanical Rate: 16
O2 Saturation: 93.8 %
Patient temperature: 37
RATE: 16 resp/min
pCO2 arterial: 46 mmHg (ref 32.0–48.0)
pH, Arterial: 7.32 — ABNORMAL LOW (ref 7.350–7.450)
pO2, Arterial: 76 mmHg — ABNORMAL LOW (ref 83.0–108.0)

## 2021-05-28 LAB — CBC
HCT: 24.4 % — ABNORMAL LOW (ref 39.0–52.0)
HCT: 20.3 % — ABNORMAL LOW (ref 39.0–52.0)
HCT: 25.2 % — ABNORMAL LOW (ref 39.0–52.0)
Hemoglobin: 8.4 g/dL — ABNORMAL LOW (ref 13.0–17.0)
Hemoglobin: 6.9 g/dL — ABNORMAL LOW (ref 13.0–17.0)
Hemoglobin: 8.8 g/dL — ABNORMAL LOW (ref 13.0–17.0)
MCH: 31.8 pg (ref 26.0–34.0)
MCH: 31.8 pg (ref 26.0–34.0)
MCH: 31.9 pg (ref 26.0–34.0)
MCHC: 34.4 g/dL (ref 30.0–36.0)
MCHC: 34 g/dL (ref 30.0–36.0)
MCHC: 34.9 g/dL (ref 30.0–36.0)
MCV: 92.4 fL (ref 80.0–100.0)
MCV: 93.5 fL (ref 80.0–100.0)
MCV: 91.3 fL (ref 80.0–100.0)
Platelets: 144 10*3/uL — ABNORMAL LOW (ref 150–400)
Platelets: 165 10*3/uL (ref 150–400)
Platelets: 159 10*3/uL (ref 150–400)
RBC: 2.64 MIL/uL — ABNORMAL LOW (ref 4.22–5.81)
RBC: 2.17 MIL/uL — ABNORMAL LOW (ref 4.22–5.81)
RBC: 2.76 MIL/uL — ABNORMAL LOW (ref 4.22–5.81)
RDW: 15.7 % — ABNORMAL HIGH (ref 11.5–15.5)
RDW: 15.8 % — ABNORMAL HIGH (ref 11.5–15.5)
RDW: 15.1 % (ref 11.5–15.5)
WBC: 13.3 10*3/uL — ABNORMAL HIGH (ref 4.0–10.5)
WBC: 15.8 10*3/uL — ABNORMAL HIGH (ref 4.0–10.5)
WBC: 16.3 10*3/uL — ABNORMAL HIGH (ref 4.0–10.5)
nRBC: 0 % (ref 0.0–0.2)
nRBC: 0 % (ref 0.0–0.2)
nRBC: 0 % (ref 0.0–0.2)

## 2021-05-28 LAB — COMPREHENSIVE METABOLIC PANEL
ALT: 25 U/L (ref 0–44)
AST: 24 U/L (ref 15–41)
Albumin: 1.7 g/dL — ABNORMAL LOW (ref 3.5–5.0)
Alkaline Phosphatase: 33 U/L — ABNORMAL LOW (ref 38–126)
Anion gap: 9 (ref 5–15)
BUN: 17 mg/dL (ref 8–23)
CO2: 26 mmol/L (ref 22–32)
Calcium: 7.6 mg/dL — ABNORMAL LOW (ref 8.9–10.3)
Chloride: 116 mmol/L — ABNORMAL HIGH (ref 98–111)
Creatinine, Ser: 0.89 mg/dL (ref 0.61–1.24)
GFR, Estimated: >60 mL/min (ref >60)
Glucose, Bld: 186 mg/dL — ABNORMAL HIGH (ref 70–99)
Potassium: 3.6 mmol/L (ref 3.5–5.1)
Sodium: 151 mmol/L — ABNORMAL HIGH (ref 135–145)
Total Bilirubin: 0.8 mg/dL (ref 0.3–1.2)
Total Protein: 3.8 g/dL — ABNORMAL LOW (ref 6.5–8.1)

## 2021-05-28 LAB — CULTURE, BLOOD (ROUTINE X 2)
Culture: NO GROWTH
Culture: NO GROWTH
Special Requests: ADEQUATE
Special Requests: ADEQUATE

## 2021-05-28 LAB — LACTIC ACID, PLASMA: Lactic Acid, Venous: 1.2 mmol/L (ref 0.5–1.9)

## 2021-05-28 LAB — PHOSPHORUS: Phosphorus: 2.6 mg/dL (ref 2.5–4.6)

## 2021-05-28 LAB — PREPARE RBC (CROSSMATCH)

## 2021-05-28 LAB — MAGNESIUM: Magnesium: 1.8 mg/dL (ref 1.7–2.4)

## 2021-05-28 LAB — PROTIME-INR
INR: 1 (ref 0.8–1.2)
Prothrombin Time: 13.4 seconds (ref 11.4–15.2)

## 2021-05-28 LAB — TROPONIN I (HIGH SENSITIVITY)
Troponin I (High Sensitivity): 107 ng/L (ref <18)
Troponin I (High Sensitivity): 107 ng/L (ref <18)

## 2021-05-28 SURGERY — EMBOLIZATION
Anesthesia: Moderate Sedation

## 2021-05-28 MED ORDER — LACTATED RINGERS IV BOLUS
1000.0000 mL | Freq: Once | INTRAVENOUS | Status: AC
  Administered 2021-05-28: 1000 mL via INTRAVENOUS

## 2021-05-28 MED ORDER — CEFAZOLIN SODIUM-DEXTROSE 1-4 GM/50ML-% IV SOLN
1.0000 g | Freq: Once | INTRAVENOUS | Status: DC
  Filled 2021-05-28: qty 50

## 2021-05-28 MED ORDER — CEFAZOLIN SODIUM-DEXTROSE 2-4 GM/100ML-% IV SOLN
INTRAVENOUS | Status: AC
  Filled 2021-05-28: qty 100

## 2021-05-28 MED ORDER — METHYLPREDNISOLONE SODIUM SUCC 125 MG IJ SOLR: 125.0000 mg | Freq: Once | INTRAMUSCULAR | Status: DC | PRN

## 2021-05-28 MED ORDER — MIDAZOLAM HCL 2 MG/ML PO SYRP: 8.0000 mg | ORAL_SOLUTION | Freq: Once | ORAL | Status: DC | PRN

## 2021-05-28 MED ORDER — IODIXANOL 320 MG/ML IV SOLN
INTRAVENOUS | Status: DC | PRN
  Administered 2021-05-28: 55 mL

## 2021-05-28 MED ORDER — SODIUM CHLORIDE 0.9 % IV SOLN: INTRAVENOUS | Status: DC

## 2021-05-28 MED ORDER — ONDANSETRON HCL 4 MG/2ML IJ SOLN: 4.0000 mg | Freq: Four times a day (QID) | INTRAMUSCULAR | Status: DC | PRN

## 2021-05-28 MED ORDER — SODIUM CHLORIDE 0.9% IV SOLUTION: Freq: Once | INTRAVENOUS | Status: AC

## 2021-05-28 MED ORDER — CEFAZOLIN SODIUM-DEXTROSE 2-4 GM/100ML-% IV SOLN: 2.0000 g | Freq: Once | INTRAVENOUS | Status: DC

## 2021-05-28 MED ORDER — POTASSIUM CHLORIDE 10 MEQ/100ML IV SOLN
10.0000 meq | INTRAVENOUS | Status: AC
  Administered 2021-05-28 (×4): 10 meq via INTRAVENOUS
  Filled 2021-05-28 (×4): qty 100

## 2021-05-28 MED ORDER — DIPHENHYDRAMINE HCL 50 MG/ML IJ SOLN: 50.0000 mg | Freq: Once | INTRAMUSCULAR | Status: DC | PRN

## 2021-05-28 MED ORDER — FAMOTIDINE 20 MG PO TABS: 40.0000 mg | ORAL_TABLET | Freq: Once | ORAL | Status: DC | PRN

## 2021-05-28 MED ORDER — FENTANYL CITRATE (PF) 100 MCG/2ML IJ SOLN
INTRAMUSCULAR | Status: AC
  Filled 2021-05-28: qty 2

## 2021-05-28 MED ORDER — HYDROMORPHONE HCL 1 MG/ML IJ SOLN
1.0000 mg | Freq: Once | INTRAMUSCULAR | Status: AC | PRN
  Administered 2021-05-30: 1 mg via INTRAVENOUS
  Filled 2021-05-28: qty 1

## 2021-05-28 MED ORDER — MIDAZOLAM HCL 2 MG/2ML IJ SOLN
INTRAMUSCULAR | Status: AC
  Filled 2021-05-28: qty 2

## 2021-05-28 SURGICAL SUPPLY — 31 items
BLOCK BEAD 300-500 MIC (Vascular Products) ×3 IMPLANT
CANNULA 5F STIFF (CANNULA) ×3 IMPLANT
CATH ANGIO 5F 80CM MHK 2 (CATHETERS) ×3 IMPLANT
CATH ANGIO 5F PIGTAIL 65CM (CATHETERS) ×3 IMPLANT
CATH COBRA TEMPO C2 5X65 (CATHETERS) ×3 IMPLANT
CATH MICROCATH PRGRT 2.8F 110 (CATHETERS) ×1 IMPLANT
CATH VS15FR (CATHETERS) ×3 IMPLANT
COIL 400 COMPLEX SOFT 2X2CM (Vascular Products) ×3 IMPLANT
COIL 400 COMPLEX SOFT 2X4CM (Vascular Products) ×3 IMPLANT
COIL 400 COMPLEX SOFT 3X15CM (Vascular Products) ×6 IMPLANT
COIL 400 COMPLEX STD 3X12CM (Vascular Products) IMPLANT
COIL 400 COMPLEX STD 3X5CM (Vascular Products) ×3 IMPLANT
COVER PROBE U/S 5X48 (MISCELLANEOUS) ×3 IMPLANT
DEVICE STARCLOSE SE CLOSURE (Vascular Products) ×3 IMPLANT
DEVICE TORQUE (MISCELLANEOUS) ×3 IMPLANT
GLIDEWIRE ADV .035X180CM (WIRE) ×3 IMPLANT
GLIDEWIRE ANGLED SS 035X260CM (WIRE) ×3 IMPLANT
GUIDEWIRE ANGLED .035 180CM (WIRE) ×3 IMPLANT
HANDLE DETACHMENT COIL (MISCELLANEOUS) ×3 IMPLANT
MICROCATH PROGREAT 2.8F 110 CM (CATHETERS) ×3
PACK ANGIOGRAPHY (CUSTOM PROCEDURE TRAY) ×3 IMPLANT
SHEATH ANL2 6FRX45 HC (SHEATH) ×3 IMPLANT
SHEATH BRITE TIP 5FRX11 (SHEATH) ×3 IMPLANT
SHEATH BRITE TIP 6FRX11 (SHEATH) ×3 IMPLANT
SHEATH RAABE 6FR (SHEATH) ×3 IMPLANT
STOPCOCK 3 WAY MALE LL (IV SETS) ×2
STOPCOCK 3WAY MALE LL (IV SETS) ×1 IMPLANT
SYR MEDRAD MARK 7 150ML (SYRINGE) ×3 IMPLANT
TUBING CONTRAST HIGH PRESS 48 (TUBING) ×6 IMPLANT
WIRE G 018X200 V18 (WIRE) ×3 IMPLANT
WIRE GUIDERIGHT .035X150 (WIRE) ×3 IMPLANT

## 2021-05-28 NOTE — Progress Notes (Signed)
[facility] for Electrolyte Monitoring and Replacement   Recent Labs: Potassium (mmol/L)  Date Value  05/26/2021 3.1 (L)   Magnesium (mg/dL)  Date Value  05/09/2021 1.8   Calcium (mg/dL)  Date Value  05/18/2021 7.7 (L)   Albumin (g/dL)  Date Value  05/26/2021 2.2 (L)   Phosphorus (mg/dL)  Date Value  05/20/2021 2.6   Sodium (mmol/L)  Date Value  06/05/2021 145   Corrected Ca: 9.04 mg/dL  Assessment: 70 y.o. 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 Pharmacy is asked to follow electrolytes and replace while in the CCU.  K 3.7>3.1 Phos 5.2>3>2.6 Mg 2.2>1.8   On free water 81mL q4h, tube feeds 20>109ml/hr  Goal of Therapy:  Electrolytes WNL  Plan:  Hypokalemia: replete with 23meq IV q1h x4 Recheck electrolytes in AM  Lorna Dibble ,PharmD Clinical Pharmacist 05/26/2021 8:01 AM

## 2021-05-28 NOTE — Progress Notes (Addendum)
[place] Hospital Day(s): 5.   Post op day(s): 3 Days Post-Op.   Interval History:  Patient seen and examined No acute events or new complaints overnight.  Patient remains intubated, eyes open; does not participate/follow commands He is on 20 mcg/min Norepinephrine Leukocytosis stable; 13.3K; likely reactive from OR Hgb stable at 8.4; no signs of bleeding currently  Platelets up to 144K Renal function remains normal; sCr - 0.76; UO - 1.3 ccs Mild hypokalemia to 3.1; otherwise no electrolyte derangement  Surgical drain with 150 ccs; serosanguinous Started on enteric feedings through jejunostomy yesterday (06/20); 40 ml/hr  Per RN, NGT output this morning turned to bright red around 0600. Since that time however, output has slowed. He has about 300 ccs of bloody output in canister at time of my evaluation (0800). Hgb is stable compared to previous day.   Vital signs in last 24 hours: [min-max] current  Temp:  [98.3 F (36.8 C)-99.7 F (37.6 C)] 99.7 F (37.6 C) (06/22 0500) Pulse Rate:  [64-124] 124 (06/22 0600) Resp:  [16-18] 16 (06/22 0600) BP: (77-119)/(51-82) 77/51 (06/22 0600) SpO2:  [85 %-99 %] 94 % (06/22 0600) Arterial Line BP: (58-144)/(38-64) 58/38 (06/22 0600) FiO2 (%):  [30 %] 30 % (06/22 0515)     Height: 5\' 11"  (180.3 cm) Weight: 58.6 kg BMI (Calculated): 18.03   Intake/Output last 2 shifts:  06/21 0701 - 06/22 0700 In: 2518.7 [I.V.:1722.1; NG/GT:796.7] Out: 2360 [Urine:1360; Emesis/NG output:850; Drains:150]   Physical Exam:  Constitutional: intubated, eyes open HEENT: NGT in place; output appears bloody Respiratory: On ventilator  Cardiovascular: regular rate and sinus rhythm  Gastrointestinal: Soft, unable to assess tenderness, non-distended. Surgical drain in right abdomen, output serosanguinous, feeding jejunostomy in left abdomen; tube feedings Genitourinary: Foley in place Integumentary: Midline wound healing  via secondary intention, no erythema or drainage  Labs:  CBC Latest Ref Rng & Units 05/16/2021 05/27/2021 05/26/2021  WBC 4.0 - 10.5 K/uL 13.3(H) 13.0(H) 14.4(H)  Hemoglobin 13.0 - 17.0 g/dL 8.4(L) 8.4(L) 9.1(L)  Hematocrit 39.0 - 52.0 % 24.4(L) 24.6(L) 25.7(L)  Platelets 150 - 400 K/uL 144(L) 106(L) 92(L)   CMP Latest Ref Rng & Units 05/11/2021 05/27/2021 05/26/2021  Glucose 70 - 99 mg/dL 153(H) 123(H) 90  BUN 8 - 23 mg/dL 16 18 20   Creatinine 0.61 - 1.24 mg/dL 0.76 0.84 1.03  Sodium 135 - 145 mmol/L 145 144 140  Potassium 3.5 - 5.1 mmol/L 3.1(L) 3.7 3.7  Chloride 98 - 111 mmol/L 115(H) 113(H) 111  CO2 22 - 32 mmol/L 28 27 24   Calcium 8.9 - 10.3 mg/dL 7.7(L) 7.6(L) 7.6(L)  Total Protein 6.5 - 8.1 g/dL - - -  Total Bilirubin 0.3 - 1.2 mg/dL - - -  Alkaline Phos 38 - 126 U/L - - -  AST 15 - 41 U/L - - -  ALT 0 - 44 U/L - - -    Imaging studies: No new pertinent imaging studies   Assessment/Plan:  70 y.o. male 3 Days Post-Op s/p exploratory laparotomy, duodenotomy and ligation of gastroduodenal and pancreaticoduodenal vessels, Heineke-Mikulicz Pyloroplasty, and placement of feeding jejunostomy for massive bleed from duodenal ulcer with hemorrhagic shock.    - Continue NPO; likely x5 days; he will need UGI -vs CT (pending clinical condition) study prior to consideration of NGT removal; anticipate Friday 06/24   - Continue NGT to LIS; monitor and record output   - Okay to continue enteric feeding via jejunostomy; monitor bowel function; advance slowly -  Continue IVF resuscitation - Continue IV PPI - Continue IV Abx (Zosyn) - Maintain surgical drain; monitor and record output - Mine wound care: wet-to-dry dressing changes daily + PRN - Monitor leukocytosis; stable - Monitor H&H; transfuse as needed - Replete electrolytes; monitor   - Ventilator and vasopressor support per PCCM    All of the above findings and recommendations were discussed with the medical team.   -- Edison Simon, PA-C [facility] 05/20/2021, 7:17 AM [phone] M-F: 7am - 4pm

## 2021-05-28 NOTE — Consult Note (Signed)
[facility] Vascular Consult Note  MRN : [id number]  Timothy Shaffer is a 70 y.o. (July 03, 1951) male who presents with chief complaint of  Chief Complaint  Patient presents with   GI Bleeding   History of Present Illness:  The patient is critically ill intubated needing ventilator support and pressors.  Information for this consult was taken from previous epic notation as well as his other clinical team members.  Timothy Shaffer is a 71 year old male seen in consultation at the request of Dr. Dahlia Byes. He presented with four episodes of watery bowel movements.  The first one was melanotic and then the last three ones were bright red blood.  Denied abdominal pain.  Some nausea.  No fevers no chills.  He does endorse that he uses NSAIDs a few times a week.  Had a prior history of perforated ulcer more than 3 decades ago requiring exploratory laparotomy and oversewing of perforated ulcer.  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 Have personally reviewed the CT scan showing evidence of inflammation and questionable perforation within the duodenal bulb.  No obvious free air.  No masses no ascites.  I have compared this to the one 2 months ago and at that time this was unremarkable. He also had a left renal lesion.  BC shows a hemoglobin 11 white count of 14 and platelets of 184.  CMP was completely normal other than mild hypokalemia and mild hyponatremia.  On May 25, 2021 the patient underwent:  1. Exploratory laparotomy 2. Duodenotomy and ligation of gastroduodenal and pancreaticoduodenal bleeding vessel 3. Heineke-Mikulicz Pyloroplasty 4. Omental pedicle graft to reinforce pyloroplasty 5. Feeding Jejunostomy 18 Fr Red rubber Witzel type 6. Lysis of adhesions taking at least 1 hr of total operative time   Patient is critically ill intubated requiring ventilator support, pressure support, his hemoglobin was noted to decrease this  morning now 6.9 requiring blood transfusion.  Vascular surgery was consulted by Dr. Adora Fridge for possible endovascular embolization the setting of recurrent GI bleed.  Current Facility-Administered Medications  Medication Dose Route Frequency Provider Last Rate Last Admin   0.9 %  sodium chloride infusion (Manually program via Guardrails IV Fluids)   Intravenous Once Lavina Hamman, MD       0.9 %  sodium chloride infusion (Manually program via Guardrails IV Fluids)   Intravenous Once Lavina Hamman, MD       0.9 %  sodium chloride infusion (Manually program via Guardrails IV Fluids)   Intravenous Once Mannam, Praveen, MD       0.9 %  sodium chloride infusion   Intravenous Continuous Rust-Chester, Huel Cote, NP 75 mL/hr at 05/27/21 2000 Infusion Verify at 05/27/21 2000   0.9 %  sodium chloride infusion   Intravenous PRN Ivor Costa, MD   Stopped at 05/26/21 1544   0.9 %  sodium chloride infusion  250 mL Intravenous Continuous Ottie Glazier, MD       acetaminophen (TYLENOL) suppository 650 mg  650 mg Rectal Q6H PRN Ivor Costa, MD       albuterol (PROVENTIL) (2.5 MG/3ML) 0.083% nebulizer solution 2.5 mg  2.5 mg Nebulization Q4H PRN Ivor Costa, MD       ascorbic acid (VITAMIN C) tablet 250 mg  250 mg Per Tube BID Mannam, Praveen, MD   250 mg at 05/27/21 2148   chlorhexidine gluconate (MEDLINE KIT) (PERIDEX) 0.12 % solution 15 mL  15 mL Mouth Rinse BID Aleskerov, Fuad,  MD   15 mL at 05/10/2021 5625   Chlorhexidine Gluconate Cloth 2 % PADS 6 each  6 each Topical Daily Ottie Glazier, MD   6 each at 05/27/21 2245   feeding supplement (PIVOT 1.5 CAL) liquid 1,000 mL  1,000 mL Per Tube Q24H Mannam, Praveen, MD   Stopped at 05/18/2021 0700   feeding supplement (PROSource TF) liquid 45 mL  45 mL Per Tube Daily Mannam, Praveen, MD       fentaNYL 2526mg in NS 2581m(1067mml) infusion-PREMIX  0-400 mcg/hr Intravenous Continuous AleOttie GlazierD 25 mL/hr at 05/22/2021 0600 250 mcg/hr at 06/63/89/3703428folic  acid 1 mg in sodium chloride 0.9 % 50 mL IVPB  1 mg Intravenous Daily NiuIvor CostaD 100.4 mL/hr at 06/04/2021 0947 1 mg at 05/22/2021 0947   free water 30 mL  30 mL Per Tube Q4H Pabon, DiePrivateer MD   30 mL at 05/29/2021 0445   heparin injection 5,000 Units  5,000 Units Subcutaneous Q8H Mannam, Praveen, MD   5,000 Units at 05/15/2021 0149   MEDLINE mouth rinse  15 mL Mouth Rinse 10 times per day AleOttie GlazierD   15 mL at 05/21/2021 1118   morphine 2 MG/ML injection 1 mg  1 mg Intravenous Q4H PRN NiuIvor CostaD       multivitamin liquid 15 mL  15 mL Per Tube Daily Mannam, Praveen, MD       nicotine (NICODERM CQ - dosed in mg/24 hours) patch 21 mg  21 mg Transdermal Daily NiuIvor CostaD   21 mg at 06/01/2021 1100   norepinephrine (LEVOPHED) 16 mg in 250m45memix infusion  0-40 mcg/min Intravenous Titrated KeenDarel HongNP 18.75 mL/hr at 05/11/2021 1125 20 mcg/min at 05/27/2021 1125   ondansetron (ZOFRAN) injection 4 mg  4 mg Intravenous Q8H PRN Niu,Ivor Costa       pantoprazole (PROTONIX) injection 40 mg  40 mg Intravenous Q12H Niu,Ivor Costa   40 mg at 05/27/2021 1100   piperacillin-tazobactam (ZOSYN) IVPB 3.375 g  3.375 g Intravenous Q8H MerrChinita GreenlandRPH 12.5 mL/hr at 06/05/2021 0818 3.375 g at 05/16/2021 0818   potassium chloride 10 mEq in 100 mL IVPB  10 mEq Intravenous Q1 Hr x 4 Beers, BrShanon BrowH 100 mL/hr at 05/16/2021 1118 10 mEq at 05/16/2021 1118   propofol (DIPRIVAN) 1000 MG/100ML infusion  5-80 mcg/kg/min Intravenous Titrated AlesOttie Glazier 6.48 mL/hr at 05/11/2021 1132 20 mcg/kg/min at 05/15/2021 1132   sodium chloride flush (NS) 0.9 % injection 10-40 mL  10-40 mL Intracatheter Q12H AlesOttie Glazier   10 mL at 05/22/2021 09297681odium chloride flush (NS) 0.9 % injection 10-40 mL  10-40 mL Intracatheter PRN AlesOttie Glazier       thiamine (B-1) injection 100 mg  100 mg Intravenous Daily Niu,Ivor Costa   100 mg at 05/10/2021 1100   Past Medical History:  Diagnosis Date   AAA (abdominal aortic  aneurysm) (HCC)[facility]    allergies   Complication of anesthesia    Family history of adverse reaction to anesthesia    PONV   Perforated duodenal ulcer (HCC)[place]15/1993   [facility] [place]   PONV (postoperative nausea and vomiting)    PUD (peptic ulcer disease)    Past Surgical History:  Procedure Laterality Date   COLONOSCOPY  04/2020   COLONOSCOPY WITH PROPOFOL N/A 05/30/2021   Procedure: COLONOSCOPY WITH  PROPOFOL;  Surgeon: Lin Landsman, MD;  Location: [facility];  Service: Gastroenterology;  Laterality: N/A;   ESOPHAGOGASTRODUODENOSCOPY (EGD) WITH PROPOFOL N/A 05/16/2021   Procedure: ESOPHAGOGASTRODUODENOSCOPY (EGD) WITH PROPOFOL;  Surgeon: Lin Landsman, MD;  Location: [facility];  Service: Gastroenterology;  Laterality: N/A;   JEJUNOSTOMY N/A 05/11/2021   Procedure: FEEDING JEJUNOSTOMY;  Surgeon: Jules Husbands, MD;  Location: [facility];  Service: General;  Laterality: N/A;   LAPAROTOMY N/A 05/22/2021   Procedure: EXPLORATORY LAPAROTOMY WITH OVERSOWING OF BLEEDING ULCER;  Surgeon: Jules Husbands, MD;  Location: [facility];  Service: General;  Laterality: N/A;   LYSIS OF ADHESION N/A 05/22/2021   Procedure: LYSIS OF ADHESION;  Surgeon: Jules Husbands, MD;  Location: [facility];  Service: General;  Laterality: N/A;   MASS EXCISION Left 04/24/2014   Procedure: LEFT LONG & RING FINGERS DISTAL INTERPHALANGEAL JOINT/CYST EXCISION;  Surgeon: Cammie Sickle., MD;  Location: [facility];  Service: Orthopedics;  Laterality: Left;   REPAIR OF PERFORATED ULCER  1993   [facility], [place]   Social History Social History   Tobacco Use   Smoking status: Every Day    Packs/day: 0.50    Years: 15.00    Pack years: 7.50    Types: Cigarettes   Smokeless tobacco: Never  Vaping Use   Vaping Use: Never used  Substance Use Topics   Alcohol use: Yes    Alcohol/week: 12.0 standard drinks    Types: 12 Cans of beer per week   Drug use: Yes    Types:  Marijuana   Family History Family History  Problem Relation Age of Onset   Gallbladder disease Sister    Breast cancer Daughter   Denies family history of peripheral artery disease, venous disease or renal disease.  Allergies  Allergen Reactions   Nsaids     History of perforated ulcer   REVIEW OF SYSTEMS (Negative unless checked)  Constitutional: '[]' Weight loss  '[]' Fever  '[]' Chills Cardiac: '[]' Chest pain   '[]' Chest pressure   '[]' Palpitations   '[]' Shortness of breath when laying flat   '[]' Shortness of breath at rest   '[]' Shortness of breath with exertion. Vascular:  '[]' Pain in legs with walking   '[]' Pain in legs at rest   '[]' Pain in legs when laying flat   '[]' Claudication   '[]' Pain in feet when walking  '[]' Pain in feet at rest  '[]' Pain in feet when laying flat   '[]' History of DVT   '[]' Phlebitis   '[]' Swelling in legs   '[]' Varicose veins   '[]' Non-healing ulcers Pulmonary:   '[]' Uses home oxygen   '[]' Productive cough   '[]' Hemoptysis   '[]' Wheeze  '[]' COPD   '[]' Asthma Neurologic:  '[]' Dizziness  '[]' Blackouts   '[]' Seizures   '[]' History of stroke   '[]' History of TIA  '[]' Aphasia   '[]' Temporary blindness   '[]' Dysphagia   '[]' Weakness or numbness in arms   '[]' Weakness or numbness in legs Musculoskeletal:  '[]' Arthritis   '[]' Joint swelling   '[]' Joint pain   '[]' Low back pain Hematologic:  '[]' Easy bruising  '[x]' Easy bleeding   '[]' Hypercoagulable state   '[x]' Anemic  '[]' Hepatitis Gastrointestinal:  '[]' Blood in stool   '[]' Vomiting blood  '[]' Gastroesophageal reflux/heartburn   '[]' Difficulty swallowing. Genitourinary:  '[]' Chronic kidney disease   '[]' Difficult urination  '[]' Frequent urination  '[]' Burning with urination   '[]' Blood in urine Skin:  '[]' Rashes   '[]' Ulcers   '[]' Wounds Psychological:  '[]' History of anxiety   '[]'  History of major depression.  Physical Examination  Vitals:   05/26/2021 1045  05/20/2021 1100 05/22/2021 1104 06/03/2021 1115  BP: 93/60 102/63  104/69  Pulse: 90 (!) 103  100  Resp: '16 16  16  ' Temp:      TempSrc:      SpO2: 99% 97% 98% 100%  Weight:       Height:       Body mass index is 18.02 kg/m. Gen: Critically ill, vented Head: [place]/AT, No temporalis wasting. Prominent temp pulse not noted. Ear/Nose/Throat: Hearing grossly intact, nares w/o erythema or drainage, oropharynx w/o Erythema/Exudate Eyes: Sclera non-icteric, conjunctiva clear Neck: Trachea midline.  No JVD.  Pulmonary: Intubated Respirations not labored, equal bilaterally.  Cardiac: RRR, normal S1, S2. Vascular:  Vessel Right Left  Radial Palpable Palpable  Ulnar Palpable Palpable                               Gastrointestinal: soft, mild distention, dark blood tinged in NG Musculoskeletal: M/S 5/5 throughout.  Extremities without ischemic changes.  No deformity or atrophy. No edema. Neurologic: Sensation grossly intact in extremities.  Symmetrical.  Speech is fluent. Motor exam as listed above. Psychiatric: Intubated Dermatologic: No rashes or ulcers noted.  No cellulitis or open wounds. Lymph : No Cervical, Axillary, or Inguinal lymphadenopathy.  CBC Lab Results  Component Value Date   WBC 15.8 (H) 05/21/2021   HGB 6.9 (L) 05/20/2021   HCT 20.3 (L) 05/22/2021   MCV 93.5 05/20/2021   PLT 165 06/03/2021   BMET    Component Value Date/Time   NA 151 (H) 05/20/2021 0800   K 3.6 06/05/2021 0800   CL 116 (H) 05/30/2021 0800   CO2 26 05/18/2021 0800   GLUCOSE 186 (H) 05/21/2021 0800   BUN 17 05/08/2021 0800   CREATININE 0.89 05/22/2021 0800   CALCIUM 7.6 (L) 06/01/2021 0800   GFRNONAA >60 05/15/2021 0800   GFRAA >60 07/24/2020 0512   Estimated Creatinine Clearance: 64 mL/min (by C-G formula based on SCr of 0.89 mg/dL).  COAG Lab Results  Component Value Date   INR 1.0 05/08/2021   INR 1.6 (H) 05/22/2021   INR 1.5 (H) 05/21/2021   Radiology DG Chest Port 1 View  Result Date: 05/26/2021 CLINICAL DATA:  Status post intubation and central line placement. EXAM: PORTABLE CHEST 1 VIEW COMPARISON:  07/22/2020. FINDINGS: ET tube mid trachea. Right  IJ central venous catheter tip projects over the superior vena cava. Stable cardiac and mediastinal contours. Right basilar heterogeneous opacities. Age-indeterminate posterior right rib fracture. Lung disease excluded view. IMPRESSION: ET tube mid trachea. Right IJ central venous catheter tip projects over the superior vena cava. Lung apices are excluded from view limiting evaluation for pneumothorax. Probable right basilar atelectasis. Electronically Signed   By: Lovey Newcomer M.D.   On: 05/19/2021 10:15   DG UGI W SINGLE CM (SOL OR THIN BA)  Result Date: 06/01/2021 CLINICAL DATA:  Evaluate gastric ulceration with possible perforation. EXAM: WATER SOLUBLE UPPER GI SERIES TECHNIQUE: Single-column upper GI series was performed using water soluble contrast. CONTRAST:  100 mL Omnipaque 300 orally COMPARISON:  CT abdomen 05/31/2021 FLUOROSCOPY TIME:  Fluoroscopy Time:  1 minutes 36 seconds Radiation Exposure Index (if provided by the fluoroscopic device): 35.1 mGy Number of Acquired Spot Images: 0 FINDINGS: Examination of the esophagus demonstrated normal esophageal motility. Normal esophageal morphology without evidence of esophagitis or ulceration. No esophageal stricture, diverticula, or mass lesion. No evidence of hiatal hernia. Mild gastroesophageal reflux. Mucosal thickening throughout the  stomach as can be seen with gastritis. Focal outpouching of contrast along the superior anterior aspect of distal antrum with adjacent mucosal irregularity most concerning for a contained perforated ulcer. No peritoneal spread of contrast. No gastric outlet obstruction. Duodenum is unremarkable. IMPRESSION: 1. Focal outpouching of contrast along the superior anterior aspect of distal antrum with adjacent mucosal irregularity most concerning for a contained perforated ulcer. No peritoneal spread of contrast. 2. Overall findings concerning for underlying gastritis. Electronically Signed   By: Kathreen Devoid   On: 05/22/2021 16:20    [place] EKG SITE RITE  Result Date: 06/01/2021 If Site Rite image not attached, placement could not be confirmed due to current cardiac rhythm.  CT ANGIO GI BLEED  Result Date: 05/10/2021 CLINICAL DATA:  GI bleed EXAM: CTA ABDOMEN AND PELVIS WITHOUT AND WITH CONTRAST TECHNIQUE: Multidetector CT imaging of the abdomen and pelvis was performed using the standard protocol during bolus administration of intravenous contrast. Multiplanar reconstructed images and MIPs were obtained and reviewed to evaluate the vascular anatomy. CONTRAST:  155m OMNIPAQUE IOHEXOL 350 MG/ML SOLN COMPARISON:  the previous day's study FINDINGS: VASCULAR Aorta: Moderate calcified atheromatous plaque throughout. 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 No dissection or stenosis. Celiac: Patent without evidence of aneurysm, dissection, vasculitis or significant stenosis. SMA: Partially calcified ostial plaque resulting in 2 cm mild stenosis, patent distally with classic distal branch anatomy. Renals: Single left, heavily calcified proximal without high-grade stenosis. Single right, with irregular partially calcified ostial plaque extending over length of approximately 1.6 cm without high-grade stenosis, patent distally. IMA: Patent without evidence of aneurysm, dissection, vasculitis or significant stenosis. Inflow: Right common iliac artery fusiform dilatation up to 1.8 cm, left 1.7 cm. Moderate calcified atheromatous plaque throughout the iliac arterial systems bilaterally no dissection or stenosis. Proximal Outflow: Atheromatous, patent Veins: Patent hepatic veins, portal vein, SMV, splenic vein. Retroaortic left renal vein, an anatomic variant. Iliac venous system and IVC unremarkable. No venous pathology evident. Review of the MIP images confirms the above findings. NON-VASCULAR Lower chest: Visualized lung bases clear. No pleural or pericardial effusion. Hepatobiliary: Fatty liver without focal lesion. Gallbladder  incompletely distended without calcified stones. Pancreas: Unremarkable. No pancreatic ductal dilatation or surrounding inflammatory changes. Spleen: Normal in size without focal abnormality. Adrenals/Urinary Tract: 3.2 cm enhancing hypervascular mid left renal mass containing coarse calcifications. No urolithiasis or hydronephrosis. Urinary bladder physiologically distended. Stomach/Bowel: Stomach is incompletely distended. There is mucosal hypoenhancement posteriorly in the antrum suggesting ulceration, with process containing small gas bubbles extending adjacent to the GDA suggesting contained perforation. Small bowel is nondistended. Previous right hemicolectomy. Residual oral contrast material in the decompressed left colon with no convincing evidence of active extravasation. Lymphatic: Enlarged mesenteric node adjacent to the gastric process. No retroperitoneal or pelvic adenopathy. Reproductive: Prostate enlargement with central coarse calcifications. Other: Left pelvic phleboliths.  No ascites.  No free air. Musculoskeletal: Narrowing of the L4-5 interspace with circumferential disc bulge/protrusion. No fracture or worrisome bone lesion. IMPRESSION: 1. Negative for active extravasation into the GI tract. 2. Perforated process in the gastric antrum may represent peptic ulcer disease versus ulcerated neoplasm. This is in proximity to the [place] without pseudoaneurysm or other stigmata of bleeding. Critical Value/emergent results were called by telephone at the time of interpretation on 05/10/2021 at 11:21 am to provider Dr. PDahlia Byes who verbally acknowledged these results. 3. 3.2 cm left renal mass probably renal cell carcinoma. Urology consult recommended. 4.  Aortic Atherosclerosis (ICD10-170.0). Electronically Signed   By: DKeturah Barre  Vernard Gambles M.D.   On: 05/21/2021 11:23   CT Angio Abd/Pel W and/or Wo Contrast  Result Date: 05/15/2021 CLINICAL DATA:  70 year old male with a history of GI bleeding EXAM: CTA ABDOMEN AND  PELVIS WITHOUT AND WITH CONTRAST TECHNIQUE: Multidetector CT imaging of the abdomen and pelvis was performed using the standard protocol during bolus administration of intravenous contrast. Multiplanar reconstructed images and MIPs were obtained and reviewed to evaluate the vascular anatomy. CONTRAST:  175m OMNIPAQUE IOHEXOL 350 MG/ML SOLN COMPARISON:  03/12/2021, 07/20/2020 FINDINGS: VASCULAR Aorta: Diameter of the aorta at the hiatus measures 2.6 cm. Mild atherosclerotic changes of the abdominal aorta. No ulcerated plaque or pedunculated plaque. No dissection. Juxtarenal aorta measures 2.3 cm. Largest diameter of the infrarenal abdominal aorta measures 2.4 cm. Celiac: Celiac artery is patent with mild atherosclerotic changes. Stenosis of the left gastric artery origin. SMA: Moderate atherosclerotic changes at the SMA origin including soft plaque which narrows the lumen beyond the origin by estimated 50%. Branches are patent. Renals: - Right: Mild to moderate atherosclerotic changes at the right renal artery origin without evidence of high-grade stenosis. - Left: Mild to moderate atherosclerotic changes of the left renal artery origin without high-grade stenosis. IMA: IMA is patent Right lower extremity: Unremarkable course, caliber, and contour of the right iliac system. Moderate atherosclerotic changes of the right iliac system without high-grade stenosis or occlusion. No aneurysm, dissection, or occlusion. Hypogastric artery is patent, with atherosclerotic changes. Common femoral artery patent. Proximal SFA and profunda femoris patent. Left lower extremity: Unremarkable course, caliber, and contour of the left iliac system. Moderate atherosclerotic changes of the left iliac system without high-grade stenosis or occlusion. No aneurysm, dissection, or occlusion. Hypogastric artery is patent, with atherosclerotic changes. Common femoral artery patent. Proximal SFA and profunda femoris patent. Veins: Unremarkable  appearance of the venous system. Review of the MIP images confirms the above findings. NON-VASCULAR Lower chest: No acute. Hepatobiliary: Diffusely decreased attenuation of liver parenchyma no focal lesions identified. Focal fatty sparing within lower segment 3. Gallbladder is relatively decompressed. Pancreas: Unremarkable. Spleen: Unremarkable. Adrenals/Urinary Tract: - Right adrenal gland: Unremarkable - Left adrenal gland: Unremarkable. - Right kidney: No hydronephrosis, nephrolithiasis, inflammation, or ureteral dilation. No focal lesion. - Left Kidney: No hydronephrosis, nephrolithiasis, inflammation, or ureteral dilation. Interpolar mass of the lateral left renal cortex estimated 2.9 cm, similar to the prior CT and concerning for renal cell carcinoma. - Urinary Bladder: Urinary bladder is relatively decompressed. Stomach/Bowel: - Stomach: Surgical changes along the lesser curvature of the stomach. Inflammatory changes in the region of the gastric antrum and pylorus, with decompression of the stomach lumen. There is gas at the posterior aspect of the stomach antrum, separate from the lumen of the stomach in 3 locations, image 66, image 75, and image 71 of series 5. There are enhancing small nodules within the gastrocolic ligament, image 81 of series 5 and image 33 of series 7. Edema within the associated fat. There is a borderline enlarged lymph node measuring 8 mm-9 mm in the pre duodenal region, new from the comparison CT. - Small bowel: Unremarkable. No accumulation of contrast or air-fluid levels. - Appendix: Appendix is not visualized, however, no inflammatory changes are present adjacent to the cecum to indicate an appendicitis. - Colon: Surgical changes of the left colon. No evidence of obstruction. No focal inflammatory changes. No accumulation of contrast. No air-fluid levels. Lymphatic: No inguinal adenopathy. No pelvic adenopathy. No periaortic adenopathy. Lymph node anterior to the duodenum, 8 mm-9  mm. There  are small nodules in the gastrocolic ligament, as described above. Small nodules in the gastrohepatic ligament. Mesenteric: No significant free fluid. Reproductive: Transverse diameter of the prostate 37 mm. Other: No hernia. Musculoskeletal: No evidence of acute fracture. No bony canal narrowing. Degenerative changes of the spine. Degenerative changes of the hips. No aggressive lytic or sclerotic lesions identified. IMPRESSION: CT angiogram is negative for evidence for acute gastrointestinal hemorrhage. Findings of contained perforation at the gastric antrum, concerning for perforated gastric cancer, or alternatively gastric ulcer. There are suspicious small soft tissue implants/nodules within the gastrocolic ligament, gastrohepatic ligament, as well as a borderline pre duodenal lymph node. Surgical evaluation is indicated. These above preliminary results were discussed by telephone at the time of interpretation on 05/21/2021 at 2:40 pm with Dr. Tamala Julian. 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 As previously mentioned, MRI may be useful for further evaluation. Aortic atherosclerosis and mesenteric arterial disease, without evidence of high-grade stenosis. Aortic Atherosclerosis (ICD10-I70.0). Bilateral renal arterial disease without high-grade stenosis. Additional ancillary findings as above. Signed, Dulcy Fanny. Dellia Nims, RPVI [facility] Radiology Electronically Signed   By: Corrie Mckusick D.O.   On: 05/11/2021 14:49    Assessment/Plan The patient is a 70 year old male with multiple medical issues who presented with bleeding duodenal ulceration s/p Heineke-Mikulicz Pyloroplasty - POD#3 with continued bleeding requiring pressors as well as blood transfusion  1.  Bleeding duodenal ulceration: Patient has undergone surgical repair however continues to show signs of bleeding. The patient is critically ill requiring intubation and mechanical  ventilation, pressors and recently this morning blood transfusion for downward drifting hemoglobin.  Vascular surgery was consulted for possible endovascular intervention for continued gastrointestinal bleeding.  Procedure, risks and benefits were explained to the patient's family member who is at the bedside.  All questions were answered.  Patient's family member wishes to proceed.  We will plan on this today with Dr. Delana Meyer.  2.  Moderate alcohol consumption: Moving forward this can certainly exacerbate any gastritis /continue to put the patient at high risk for recurrent ulceration. ?  Social work consult  3.  Anemia: Requiring blood transfusions this AM Continue to serial CBC This is being managed by the critical care team  Discussed with Dr. Francene Castle, PA-C  05/14/2021 11:44 AM  This note was created with Dragon medical transcription system.  Any error is purely unintentional.

## 2021-05-28 NOTE — Progress Notes (Signed)
NAME:  Timothy Shaffer, MRN:  [id number], DOB:  [birth year], LOS: 5 ADMISSION DATE:  05/27/2021, CONSULTATION DATE:  05/26/21 REFERRING MD:  Marlowe Sax MD , CHIEF COMPLAINT:   GI bleed  History of Present Illness:  70 yo M with PMH of AAA, mod persistent-asthma, ADP, PUD, cecal mass, GIB, GERD, pneumothorax s/p chest tube, hx of ex-lap with PUD and perforation in the past, protein cal malnutrition, left renal mass who came in with UGIB s/p GI evalation with EGD/c-Scope requiring clipping intervention of bleeding ulcer with initial response to stable vital signs. Overnight on medica floor patient with clinical deterioration and instability of vital and significant hematochezia leading to unresponsiveness with circulatory shock. Patient was brought in to MICU emergently intubated placed on MV with requirement of pRBC transfusion and norepinephrine infusion as well as central vascular access for rapid ressussitation  Pertinent  Medical History    has a past medical history of AAA (abdominal aortic aneurysm) ([place]), Asthma, Complication of anesthesia, Family history of adverse reaction to anesthesia, Perforated duodenal ulcer ([place]) (05/21/1992), PONV (postoperative nausea and vomiting), and PUD (peptic ulcer disease).   Significant Hospital Events: Including procedures, antibiotic start and stop dates in addition to other pertinent events   6/18 upper endoscopy with clipping 6/19 exploratory laparotomy  6/20 remains on Levophed, started on feeds through jejunostomy tube   Interim History / Subjective:   Remains on Levophed.  Blood pressure dropped earlier today with tachycardia.  Bloody output from NG tube.  Objective   Blood pressure 100/66, pulse (!) 132, temperature 97.7 F (36.5 C), temperature source Axillary, resp. rate 15, height 5\' 11"  (1.803 m), weight 58.6 kg, SpO2 99 %.    Vent Mode: PRVC FiO2 (%):  [30 %] 30 % Set Rate:  [16 bmp] 16 bmp Vt Set:  [450 mL] 450 mL PEEP:  [5 cmH20] 5  cmH20 Pressure Support:  [5 cmH20] 5 cmH20 Plateau Pressure:  [15 cmH20] 15 cmH20   Intake/Output Summary (Last 24 hours) at 05/20/2021 0745 Last data filed at 05/16/2021 0730 Gross per 24 hour  Intake 2518.72 ml  Output 2560 ml  Net -41.28 ml   Filed Weights   05/18/2021 1045 05/31/2021 0444 05/26/21 0600  Weight: 56.7 kg 54 kg 58.6 kg    Examination: Gen:      No acute distress, chronically ill-appearing HEENT:  EOMI, sclera anicteric Neck:     No masses; no thyromegaly, ETT Lungs:    Clear to auscultation bilaterally; normal respiratory effort CV:         Regular rate and rhythm; no murmurs Abd:      Abdominal wound dressing clean and dry Ext:    No edema; adequate peripheral perfusion Skin:      Warm and dry; no rash Neuro: Sedated  Labs/imaging that I havepersonally reviewed  (right click and "Reselect all SmartList Selections" daily)   Potassium 3.1, BUN/creatinine 16/0.76, WBC 13.3, hemoglobin 8.4, platelets 144 No new imaging  Resolved Hospital Problem list     Assessment & Plan:  Hemorrhagic shock secondary to bleeding ulcer S/p EGD and exploratory laparotomy Creased bloody NG output We will recheck CBC, bolus LR 500 Continue Levophed.  Wean down as tolerated N.p.o. for now.  Holding tube feeds via J tube  Active smoker, alcohol abuse disorder Monitor for withdrawal  Left renal mass. Patient has seen urology referral to interventional radiology for biopsy of the left renal mass and percutaneous ablation. Follow-up is currently scheduled on 06/03/2021. Not stable for  now to consider further work-up inpatient when stabilized  Best Practice (right click and "Reselect all SmartList Selections" daily)   Diet/type: tubefeeds Pain/Anxiety/Delirium protocol RASS goal -1 VAP protocol (if indicated): Yes DVT prophylaxis: prophylactic heparin  GI prophylaxis: PPI Glucose control:  not indicated Central venous access:  Yes, and it is still needed Arterial line:  N/A  and Yes, and it is still needed Foley:  N/A and Yes, and it is still needed Mobility:  bed rest  PT consulted: Yes Studies pending: None Culture data pending:none Last reviewed culture data:today Antibiotics:zosyn Antibiotic de-escalation: no,  continue current rx Stop date: to be determined  Daily labs: requested Code Status:  full code Last date of multidisciplinary goals of care discussion []  ccm prognosis: Life-threating Disposition: remains critically ill, will stay in intensive care  Critical care time:    The patient is critically ill with multiple organ system failure and requires high complexity decision making for assessment and support, frequent evaluation and titration of therapies, advanced monitoring, review of radiographic studies and interpretation of complex data.   Critical Care Time devoted to patient care services, exclusive of separately billable procedures, described in this note is 35 minutes.   Marshell Garfinkel MD [facility] See Amion for pager  If no response to pager , please call [phone] until 7pm After 7:00 pm call Elink  [phone] 05/13/2021, 7:45 AM

## 2021-05-28 NOTE — Progress Notes (Signed)
Chaplain Maggie made initial visitation and met patient's wife, [name]. Space was made for storytelling and listening. Patient was non-responsive during entire visit. Chaplain and patient's wife prayed at the bedside for strength and faith to endure upcoming medical procedure and the healing process. Chaplain available for continued spiritual support and will follow up.

## 2021-05-28 NOTE — Progress Notes (Signed)
Pt transported from Cath lab to ICU 15 on the vent without incident. Pt remains on the vent and is tol well at this time.

## 2021-05-28 NOTE — Progress Notes (Signed)
From outset of shift, pt hypotensive, bright red ng output, large amount of bloody drainage noted on bottom sheet upon initial assessment, tachy in 130s-140s, and levo @ 40.   1000 ml bolus of LR given; labs collected (Lactate, CMP, and CBC). NG output transitioned from bright red to dark maroon. During and after bolus, able to wean levo down.  Over course of day, Levo steadily titrated up. NG tube put out over 600 ml dark red output. MD aware.    Pt remains alert and following commands, nods appropriately.   Ebolization planned from 1630-1800.

## 2021-05-28 NOTE — Op Note (Signed)
[facility]  Percutaneous Study/Intervention Procedural Note     Surgeon(s): Mudlogger: none  Pre-operative Diagnosis: 1.  Bleeding gastric ulcer 2.  Hemorrhagic shock  Post-operative diagnosis:  Same  Procedure(s) Performed:             1.  Ultrasound guidance for vascular access right femoral artery             2.  Catheter placement into gastroduodenal artery and left gastric artery             3.  Aortogram and selective angiogram of the gastroduodenal artery and left gastric artery             4.  Microbead embolization of 1 cc with 500-700  polyvinyl alcohol beads in association with coil embolization of both the gastroduodenal artery as well as the left gastric artery.             5.  StarClose closure device right femoral artery  Anesthesia: Moderate conscious sedation for approximately 80 minutes using propofol  EBL: 25 cc  Fluoro Time: 24.3 minutes  Contrast: 55 cc              Indications:  Patient is a 70 y.o.male with brisk upper GI bleeding with resultant anemia and hemorrhagic shock. The patient has surgery showing a bleeding ulcer. The patient is brought in for angiography for further evaluation and potential treatment. Risks and benefits are discussed and informed consent is obtained  Procedure:  The patient was identified and appropriate procedural time out was performed.  The patient was then placed supine on the table and prepped and draped in the usual sterile fashion. Moderate conscious sedation was administered during a face to face encounter with the patient throughout the procedure with my supervision of the RN administering medicines and monitoring the patient's vital signs, pulse oximetry, telemetry and mental status throughout from the start of the procedure until the patient was taken back to the ICU.    Ultrasound was used to evaluate the right common femoral artery.  It was patent .  A digital ultrasound  image was acquired.  A micropuncture needle was used to access the right common femoral artery under direct ultrasound guidance and a permanent image was performed.  A microwire was then advanced followed by a micro sheath which was then inserted without difficulty.  A 0.035 J wire was advanced without resistance and a 5Fr sheath was placed.  Pigtail catheter was placed into the aorta and an AP aortogram was performed. This demonstrated the approximate location of the celiac and SMA.  We transitioned to the lateral projection and a contrast injection was then used to identify the origins of the celiac.  A VS 1 catheter was used to selectively cannulate the celiac.  Attempts at advancing the sheath into the celiac proper were unsuccessful and I elected to move forward using the VS 1 catheter to secure access into the celiac and then I advanced the Pro-Great microcatheter out the VS 1 and negotiated the prograde into the gastroduodenal.  This was accomplished using hand-injection of contrast to roadmap the vasculature.  Once in the gastroduodenal there appeared to be a faint blush and given the history I felt it appropriate to treat.  I then instilled approximately 1 cc of 500 700 m PVC beads followed by two Ruby coils (a 2 mm x 4 cm and a 2 mm x 2 cm) in this location. Angiogram  following this showed the hepatic vessels to be open with only faint filling of the gastroduodenal.   I then pulled back and cannulated the left gastric with the Pro-great microcatheter without difficulty. Selective imaging was performed which showed obvious hemorrhage with contrast now filling the duodenum.  With some difficulty I was able to negotiate the catheter across the arterial perforation and hand-injection of contrast demonstrated the arteries distal to the obvious perforation.  1 cc of 500 700 m polyvinyl alcohol beads were deployed in the distal left gastric artery.  I then pulled the catheter back and again verified  intraluminal positioning proximal to the perforation.  I then deployed a 3 mm x 5 cm and a 3 mm x 15 mm Ruby coil, completion angiogram showed the left gastric was now occluded and there was no evidence of extravasation on the final image.  I elected to terminate the procedure. The diagnostic catheter was removed. StarClose closure device was deployed in usual fashion with excellent hemostatic result. The patient was taken back to the ICU in critical condition having tolerated the procedure any episodes of hemodynamic instability.   Findings: Patient has diffuse but none hemodynamically significant atherosclerotic changes.  The origin of the celiac was widely patent.  The hepatic and splenic arteries were widely patent.  Gastroduodenal was widely patent and on initial imaging there did appear to be a faint blush and therefore this artery was treated with embolization as described above.  I then selected the left gastric artery and identified profound extravasation with contrast filling the duodenum.  I embolized the left gastric distally with PVC beads and then proximally with Ruby coils again as described above.  Disposition: Patient was taken to the recovery room in stable condition having tolerated the procedure well.  Complications:  None  Timothy Shaffer 05/07/2021 7:38 PM   This note was created with Dragon Medical transcription system. Any errors in dictation are purely unintentional.

## 2021-05-28 NOTE — Progress Notes (Signed)
NAME:  Timothy Shaffer, MRN:  [id number], DOB:  Aug 04, 1951, LOS: 5 ADMISSION DATE:  05/19/2021, CONSULTATION DATE:  05/26/21 REFERRING MD:  Marlowe Sax MD , CHIEF COMPLAINT:   GI bleed  History of Present Illness:  70 yo M with PMH of AAA, mod persistent-asthma, ADP, PUD, cecal mass, GIB, GERD, pneumothorax s/p chest tube, hx of ex-lap with PUD and perforation in the past, protein cal malnutrition, left renal mass who came in with UGIB s/p GI evalation with EGD/c-Scope requiring clipping intervention of bleeding ulcer with initial response to stable vital signs. Overnight on medica floor patient with clinical deterioration and instability of vital and significant hematochezia leading to unresponsiveness with circulatory shock. Patient was brought in to MICU emergently intubated placed on MV with requirement of pRBC transfusion and norepinephrine infusion as well as central vascular access for rapid ressussitation  Pertinent  Medical History    has a past medical history of AAA (abdominal aortic aneurysm) ([place]), Asthma, Complication of anesthesia, Family history of adverse reaction to anesthesia, Perforated duodenal ulcer ([place]) (05/21/1992), PONV (postoperative nausea and vomiting), and PUD (peptic ulcer disease).   Significant Hospital Events: Including procedures, antibiotic start and stop dates in addition to other pertinent events   6/18 upper endoscopy with clipping 6/19 exploratory laparotomy  6/20 remains on Levophed, started on feeds through jejunostomy tube   Interim History / Subjective:   Remains on Levophed.  Blood pressure dropped earlier today with tachycardia.  Bloody output from NG tube.  Objective   Blood pressure 104/69, pulse 100, temperature 97.7 F (36.5 C), temperature source Axillary, resp. rate 16, height 5\' 11"  (1.803 m), weight 58.6 kg, SpO2 100 %.    Vent Mode: PRVC FiO2 (%):  [30 %] 30 % Set Rate:  [16 bmp] 16 bmp Vt Set:  [450 mL] 450 mL PEEP:  [5 cmH20] 5  cmH20 Plateau Pressure:  [12 cmH20] 12 cmH20   Intake/Output Summary (Last 24 hours) at 05/07/2021 1154 Last data filed at 05/30/2021 0900 Gross per 24 hour  Intake 2518.72 ml  Output 2500 ml  Net 18.72 ml   Filed Weights   05/15/2021 1045 05/26/2021 0444 05/26/21 0600  Weight: 56.7 kg 54 kg 58.6 kg    Examination: Gen:      No acute distress, chronically ill-appearing HEENT:  EOMI, sclera anicteric Neck:     No masses; no thyromegaly, ETT Lungs:    Clear to auscultation bilaterally; normal respiratory effort CV:         Regular rate and rhythm; no murmurs Abd:      Abdominal wound dressing clean and dry Ext:    No edema; adequate peripheral perfusion Skin:      Warm and dry; no rash Neuro: Sedated  Labs/imaging that I havepersonally reviewed  (right click and "Reselect all SmartList Selections" daily)   Potassium 3.1, BUN/creatinine 16/0.76, WBC 13.3, hemoglobin 8.4, platelets 144 No new imaging  Resolved Hospital Problem list     Assessment & Plan:  Hemorrhagic shock secondary to bleeding ulcer S/p EGD and exploratory laparotomy Increased bloody NG output We will recheck CBC, bolus LR  1 lt Continue Levophed.  Wean down as tolerated N.p.o. for now.  Holding tube feeds via J tube  Active smoker, alcohol abuse disorder Monitor for withdrawal  Left renal mass. Patient has seen urology referral to interventional radiology for biopsy of the left renal mass and percutaneous ablation. Follow-up is currently scheduled on 06/03/2021. Not stable for now to consider further work-up inpatient  when stabilized  Best Practice (right click and "Reselect all SmartList Selections" daily)   Diet/type: tubefeeds Pain/Anxiety/Delirium protocol RASS goal -1 VAP protocol (if indicated): Yes DVT prophylaxis: prophylactic heparin  GI prophylaxis: PPI Glucose control:  not indicated Central venous access:  Yes, and it is still needed Arterial line:  N/A and Yes, and it is still  needed Foley:  N/A and Yes, and it is still needed Mobility:  bed rest  PT consulted: Yes Studies pending: None Culture data pending:none Last reviewed culture data:today Antibiotics:zosyn Antibiotic de-escalation: no,  continue current rx Stop date: to be determined  Daily labs: requested Code Status:  full code Last date of multidisciplinary goals of care discussion []  ccm prognosis: Life-threating Disposition: remains critically ill, will stay in intensive care  Critical care time:    The patient is critically ill with multiple organ system failure and requires high complexity decision making for assessment and support, frequent evaluation and titration of therapies, advanced monitoring, review of radiographic studies and interpretation of complex data.   Critical Care Time devoted to patient care services, exclusive of separately billable procedures, described in this note is 35 minutes.   Marshell Garfinkel MD [facility] See Amion for pager  If no response to pager , please call [phone] until 7pm After 7:00 pm call Elink  [phone] 05/27/2021, 11:54 AM

## 2021-05-28 NOTE — Progress Notes (Signed)
   05/09/2021 1720  Clinical Encounter Type  Visited With Patient  Visit Type Initial  Referral From Nurse  Consult/Referral To Chaplain   Chaplain Mariella Saa responded to a code blue page. When Chaplain arrived at ICU unit, the code was cancelled. Chaplain checked in with the nurse, who stated it was an accident.

## 2021-05-29 ENCOUNTER — Encounter: Payer: Self-pay | Admitting: Vascular Surgery

## 2021-05-29 DIAGNOSIS — E876 Hypokalemia: Secondary | ICD-10-CM

## 2021-05-29 LAB — CBC
HCT: 25 % — ABNORMAL LOW (ref 39.0–52.0)
HCT: 23.8 % — ABNORMAL LOW (ref 39.0–52.0)
Hemoglobin: 8.6 g/dL — ABNORMAL LOW (ref 13.0–17.0)
Hemoglobin: 8.1 g/dL — ABNORMAL LOW (ref 13.0–17.0)
MCH: 31.7 pg (ref 26.0–34.0)
MCH: 31.6 pg (ref 26.0–34.0)
MCHC: 34.4 g/dL (ref 30.0–36.0)
MCHC: 34 g/dL (ref 30.0–36.0)
MCV: 92.3 fL (ref 80.0–100.0)
MCV: 93 fL (ref 80.0–100.0)
Platelets: 160 10*3/uL (ref 150–400)
Platelets: 223 10*3/uL (ref 150–400)
RBC: 2.71 MIL/uL — ABNORMAL LOW (ref 4.22–5.81)
RBC: 2.56 MIL/uL — ABNORMAL LOW (ref 4.22–5.81)
RDW: 15.7 % — ABNORMAL HIGH (ref 11.5–15.5)
RDW: 16.3 % — ABNORMAL HIGH (ref 11.5–15.5)
WBC: 16.9 10*3/uL — ABNORMAL HIGH (ref 4.0–10.5)
WBC: 21.8 10*3/uL — ABNORMAL HIGH (ref 4.0–10.5)
nRBC: 0 % (ref 0.0–0.2)
nRBC: 0.2 % (ref 0.0–0.2)

## 2021-05-29 LAB — BASIC METABOLIC PANEL
Anion gap: 3 — ABNORMAL LOW (ref 5–15)
BUN: 20 mg/dL (ref 8–23)
CO2: 25 mmol/L (ref 22–32)
Calcium: 7.5 mg/dL — ABNORMAL LOW (ref 8.9–10.3)
Chloride: 115 mmol/L — ABNORMAL HIGH (ref 98–111)
Creatinine, Ser: 0.79 mg/dL (ref 0.61–1.24)
GFR, Estimated: >60 mL/min (ref >60)
Glucose, Bld: 129 mg/dL — ABNORMAL HIGH (ref 70–99)
Potassium: 4 mmol/L (ref 3.5–5.1)
Sodium: 143 mmol/L (ref 135–145)

## 2021-05-29 LAB — GLUCOSE, CAPILLARY
Glucose-Capillary: 133 mg/dL — ABNORMAL HIGH (ref 70–99)
Glucose-Capillary: 119 mg/dL — ABNORMAL HIGH (ref 70–99)
Glucose-Capillary: 115 mg/dL — ABNORMAL HIGH (ref 70–99)
Glucose-Capillary: 104 mg/dL — ABNORMAL HIGH (ref 70–99)
Glucose-Capillary: 154 mg/dL — ABNORMAL HIGH (ref 70–99)
Glucose-Capillary: 124 mg/dL — ABNORMAL HIGH (ref 70–99)
Glucose-Capillary: 126 mg/dL — ABNORMAL HIGH (ref 70–99)

## 2021-05-29 LAB — MAGNESIUM: Magnesium: 1.7 mg/dL (ref 1.7–2.4)

## 2021-05-29 LAB — PHOSPHORUS: Phosphorus: 3.1 mg/dL (ref 2.5–4.6)

## 2021-05-29 LAB — PROTIME-INR
INR: 1 (ref 0.8–1.2)
Prothrombin Time: 13 seconds (ref 11.4–15.2)

## 2021-05-29 LAB — HEMOGLOBIN AND HEMATOCRIT, BLOOD
HCT: 21.9 % — ABNORMAL LOW (ref 39.0–52.0)
Hemoglobin: 7.6 g/dL — ABNORMAL LOW (ref 13.0–17.0)

## 2021-05-29 LAB — TRIGLYCERIDES: Triglycerides: 195 mg/dL — ABNORMAL HIGH (ref <150)

## 2021-05-29 LAB — APTT: aPTT: 33 seconds (ref 24–36)

## 2021-05-29 NOTE — Progress Notes (Signed)
Chaplain Maggie made follow up visit at bedside and met patient's daughter, Elmyra Ricks. Patient was unresponsive during entire visit. Space was made for storytelling and empathetic listening. Patient's daughter expressed how helpful it is to talk with others about her experience.Chaplain available for continued social and spiritual support as needed per on call Chaplain.

## 2021-05-29 NOTE — Progress Notes (Signed)
Came into room to check on patient. Blood was coming from aline site. Catheter barely in skin. Pressure applied to site and pressure dressing applied. Pt's HR-120s and b/p 120/70s. Pt also having maroon drainage from his NG tube has had 300 out since the beginning of the shift and 800 total for the day. Notified MD and Elink. CBC and coags ordered. CVP ordered

## 2021-05-29 NOTE — Progress Notes (Signed)
[facility] Progress Note Patient Name: Timothy Shaffer DOB: 1951-08-02 MRN: [id number]   Date of Service  05/29/2021  HPI/Events of Note  Sinus tachycardia - HR = 145. Nursing concerned about ongoing GI bleeding. CBC pending. Last platelet count = 160. BP = 85/58 with MAP = 68.  eICU Interventions  Plan: Monitor CVP now and Q 4 hours. Await CBC results. PT/INR and PTT STAT.     Intervention Category Major Interventions: Arrhythmia - evaluation and management  Evens Meno Cornelia Copa 05/29/2021, 10:57 PM

## 2021-05-29 NOTE — Progress Notes (Signed)
[facility] Daily Progress Note  05/20/2021:             1.  Ultrasound guidance for vascular access right femoral artery             2.  Catheter placement into gastroduodenal artery and left gastric artery             3.  Aortogram and selective angiogram of the gastroduodenal artery and left gastric artery             4.  Microbead embolization of 1 cc with 500-700  polyvinyl alcohol beads in association with coil embolization of both the gastroduodenal artery as well as the left gastric artery.             5.  StarClose closure device right femoral artery  Subjective: Patient intubated and sedated.  No acute issues overnight.  Objective: Vitals:   05/29/21 0600 05/29/21 0800 05/29/21 0813 05/29/21 0900  BP: 97/69 101/68  105/74  Pulse: 74 83  85  Resp: 16 16  16   Temp:  98.3 F (36.8 C)    TempSrc:  Oral    SpO2: 97% 98% 97% 98%  Weight:      Height:        Intake/Output Summary (Last 24 hours) at 05/29/2021 1052 Last data filed at 05/29/2021 0915 Gross per 24 hour  Intake 3958.8 ml  Output 1575 ml  Net 2383.8 ml   Physical Exam: Intubated and sedated CV: RRR Pulmonary: CTA Bilaterally Abdomen: Soft, Nondistended Right groin access site:  Not PID on exam.  Daily dressing has minimal drainage.  No swelling. Vascular:  Bilateral lower extremity: Thigh soft.  Calf soft.  Extremities warm distally to toes.   Laboratory: CBC    Component Value Date/Time   WBC 16.9 (H) 05/29/2021 0434   HGB 8.6 (L) 05/29/2021 0434   HCT 25.0 (L) 05/29/2021 0434   PLT 160 05/29/2021 0434   BMET    Component Value Date/Time   NA 143 05/29/2021 0434   K 4.0 05/29/2021 0434   CL 115 (H) 05/29/2021 0434   CO2 25 05/29/2021 0434   GLUCOSE 129 (H) 05/29/2021 0434   BUN 20 05/29/2021 0434   CREATININE 0.79 05/29/2021 0434   CALCIUM 7.5 (L) 05/29/2021 0434   GFRNONAA >60 05/29/2021 0434   GFRAA >60 07/24/2020 0512   Assessment/Planning: The patient is a  70 year old male presents with duodenal ulceration / GI bleed status post embolization - POD#1  1) patient doing well status s/p embolization.  No acute issues overnight. 2) off pressors and maintaining blood pressure.  Hemoglobin stable. 3) embolization seems to have been successful.  If the patient were to show any continued signs of bleeding a repeat embolization would increase his risk of perforation.  This is a generally reserved for lifesaving situations. 4) at this time, vascular surgery will sign off.  Please do not hesitate to contact us with any issues. 5) no need for any outpatient follow-up.  Discussed with Dr. Eber Hong Keerstin Bjelland PA-C 05/29/2021 10:52 AM

## 2021-05-29 NOTE — Progress Notes (Signed)
NAME:  Timothy Shaffer, MRN:  [id number], DOB:  Dec 17, 1950, LOS: 6 ADMISSION DATE:  05/09/2021, CONSULTATION DATE:  05/26/21 REFERRING MD:  Marlowe Sax MD , CHIEF COMPLAINT:   GI bleed  History of Present Illness:  70 yo M with PMH of AAA, mod persistent-asthma, ADP, PUD, cecal mass, GIB, GERD, pneumothorax s/p chest tube, hx of ex-lap with PUD and perforation in the past, protein cal malnutrition, left renal mass who came in with UGIB s/p GI evalation with EGD/c-Scope requiring clipping intervention of bleeding ulcer with initial response to stable vital signs. Overnight on medica floor patient with clinical deterioration and instability of vital and significant hematochezia leading to unresponsiveness with circulatory shock. Patient was brought in to MICU emergently intubated placed on MV with requirement of pRBC transfusion and norepinephrine infusion as well as central vascular access for rapid ressussitation  Pertinent  Medical History    has a past medical history of AAA (abdominal aortic aneurysm) ([place]), Asthma, Complication of anesthesia, Family history of adverse reaction to anesthesia, Perforated duodenal ulcer ([place]) (05/21/1992), PONV (postoperative nausea and vomiting), and PUD (peptic ulcer disease).   Significant Hospital Events: Including procedures, antibiotic start and stop dates in addition to other pertinent events   6/18 upper endoscopy with clipping 6/19 exploratory laparotomy  6/20 remains on Levophed, started on feeds through jejunostomy tube 6/22 had repeat episode of bleeding.  Underwent embolization by vascular surgery  Interim History / Subjective:   Remains on norepinephrine drip, hemoglobin is stable  Objective   Blood pressure 97/69, pulse 74, temperature 97.6 F (36.4 C), resp. rate 16, height 5\' 11"  (1.803 m), weight 58.6 kg, SpO2 97 %.    Vent Mode: PRVC FiO2 (%):  [30 %] 30 % Set Rate:  [16 bmp] 16 bmp Vt Set:  [450 mL] 450 mL PEEP:  [5 cmH20] 5  cmH20 Plateau Pressure:  [13 cmH20] 13 cmH20   Intake/Output Summary (Last 24 hours) at 05/29/2021 6314 Last data filed at 05/29/2021 0600 Gross per 24 hour  Intake 3638.8 ml  Output 1225 ml  Net 2413.8 ml   Filed Weights   05/12/2021 1045 05/19/2021 0444 05/26/21 0600  Weight: 56.7 kg 54 kg 58.6 kg    Examination: Gen:      No acute distress, Frail, malnourished HEENT:  EOMI, sclera anicteric Neck:     No masses; no thyromegaly Lungs:    Clear to auscultation bilaterally; normal respiratory effort CV:         Regular rate and rhythm; no murmurs Abd:      + bowel sounds; soft, non-tender; no palpable masses, no distension Ext:    No edema; adequate peripheral perfusion Skin:      Warm and dry; no rash Neuro: Sedated  Labs/imaging that I havepersonally reviewed  (right click and "Reselect all SmartList Selections" daily)   BUN/creatinine 20/0.79, WBC 16.9, hemoglobin 8.6, platelets 160  Resolved Hospital Problem list     Assessment & Plan:  Hemorrhagic shock secondary to bleeding ulcer S/p EGD, exploratory laparotomy N.p.o, tube feeds via [place] tube Weaning off pressors Follow CBC and transfuse for hemoglobin less than 7  Active smoker, alcohol abuse disorder Monitor for withdrawal  Left renal mass. Patient has seen urology referral to interventional radiology for biopsy of the left renal mass and percutaneous ablation. Follow-up is currently scheduled on 06/03/2021. Not stable for now to consider further work-up inpatient when stabilized  Best Practice (right click and "Reselect all SmartList Selections" daily)   Diet/type: tubefeeds  Pain/Anxiety/Delirium protocol RASS goal -1 VAP protocol (if indicated): Yes DVT prophylaxis: SCD GI prophylaxis: PPI Glucose control:  not indicated Central venous access:  Yes, and it is still needed Arterial line:  Yes, and it is still needed Foley:  Yes, and it is still needed Mobility:  bed rest  PT consulted: Yes Studies pending:  None Culture data pending:none Last reviewed culture data:today Antibiotics:zosyn Antibiotic de-escalation: no,  continue current rx Stop date: to be determined  Daily labs: requested Code Status:  full code Last date of multidisciplinary goals of care discussion []  ccm prognosis: Life-threating Disposition: remains critically ill, will stay in intensive care  Critical care time:    The patient is critically ill with multiple organ system failure and requires high complexity decision making for assessment and support, frequent evaluation and titration of therapies, advanced monitoring, review of radiographic studies and interpretation of complex data.   Critical Care Time devoted to patient care services, exclusive of separately billable procedures, described in this note is 35 minutes.   Marshell Garfinkel MD [facility] See Amion for pager  If no response to pager , please call [phone] until 7pm After 7:00 pm call Elink  [phone] 05/29/2021, 8:38 AM

## 2021-05-29 NOTE — Progress Notes (Signed)
Timothy Shaffer for Electrolyte Monitoring and Replacement   Recent Labs: Potassium (mmol/L)  Date Value  05/29/2021 4.0   Magnesium (mg/dL)  Date Value  05/29/2021 1.7   Calcium (mg/dL)  Date Value  05/29/2021 7.5 (L)   Albumin (g/dL)  Date Value  05/29/2021 1.7 (L)   Phosphorus (mg/dL)  Date Value  05/29/2021 3.1   Sodium (mmol/L)  Date Value  05/29/2021 143   Corrected Ca: 9.04 mg/dL  Assessment: 70 y.o. 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 Pharmacy is asked to follow electrolytes and replace while in the CCU.  K 3.1>3.6>4 Phos 2.6>3.1 Mg 1.8>1.7   On free water 51mL q4h, tube feeds 41ml/hr  Goal of Therapy:  Electrolytes WNL  Plan:  Lytes WNL, no repletion warranted at this time. Recheck electrolytes in AM  Timothy Shaffer ,PharmD Clinical Pharmacist 05/29/2021 11:46 AM

## 2021-05-29 NOTE — Progress Notes (Addendum)
[place] Hospital Day(s): 6.   Post op day(s): 1 Day Post-Op.   Interval History:  Patient seen and examined Underwent coil embolization of gastroduodenal and left gastric artery yesterday (06/23) with Dr Delana Meyer  Patient remains intubated, sedated this morning  He is off vasopressors this morning  Leukocytosis up to 16.9K; no fever Hgb up to 8.6; no further bleeding evident Platelets up to 160K Renal function remains normal; sCr - 0.79; UO - 685 ccs No electrolyte derangements this morning  Surgical drain with 390 ccs; serosanguinous Started on enteric feedings through jejunostomy (06/20); okay to restart NGT output; 550 ccs; clamped this morning   Vital signs in last 24 hours: [min-max] current  Temp:  [97.6 F (36.4 C)-99 F (37.2 C)] 97.6 F (36.4 C) (06/22 2010) Pulse Rate:  [68-132] 74 (06/23 0600) Resp:  [15-19] 16 (06/23 0600) BP: (87-154)/(57-99) 97/69 (06/23 0600) SpO2:  [91 %-100 %] 97 % (06/23 0600) Arterial Line BP: (80-135)/(45-79) 114/59 (06/23 0600) FiO2 (%):  [30 %] 30 % (06/23 0308)     Height: 5\' 11"  (180.3 cm) Weight: 58.6 kg BMI (Calculated): 18.03   Intake/Output last 2 shifts:  06/22 0701 - 06/23 0700 In: 3638.8 [I.V.:1761; Blood:902.5; NG/GT:330; IV Piggyback:645.3] Out: 1625 [Urine:685; Emesis/NG output:550; Drains:390]   Physical Exam:  Constitutional: intubated, eyes open HEENT: NGT in place; clamped this morning  Respiratory: On ventilator  Cardiovascular: regular rate and sinus rhythm  Gastrointestinal: Soft, unable to assess tenderness, non-distended. Surgical drain in right abdomen, output serosanguinous, feeding jejunostomy in left abdomen; tube feedings Genitourinary: Foley in place Integumentary: Midline wound healing via secondary intention, no erythema or drainage  Labs:  CBC Latest Ref Rng & Units 05/29/2021 05/18/2021 05/26/2021  WBC 4.0 - 10.5 K/uL 16.9(H) 16.3(H) 15.8(H)  Hemoglobin 13.0  - 17.0 g/dL 8.6(L) 8.8(L) 6.9(L)  Hematocrit 39.0 - 52.0 % 25.0(L) 25.2(L) 20.3(L)  Platelets 150 - 400 K/uL 160 159 165   CMP Latest Ref Rng & Units 05/29/2021 05/26/2021 05/12/2021  Glucose 70 - 99 mg/dL 129(H) 186(H) 153(H)  BUN 8 - 23 mg/dL 20 17 16   Creatinine 0.61 - 1.24 mg/dL 0.79 0.89 0.76  Sodium 135 - 145 mmol/L 143 151(H) 145  Potassium 3.5 - 5.1 mmol/L 4.0 3.6 3.1(L)  Chloride 98 - 111 mmol/L 115(H) 116(H) 115(H)  CO2 22 - 32 mmol/L 25 26 28   Calcium 8.9 - 10.3 mg/dL 7.5(L) 7.6(L) 7.7(L)  Total Protein 6.5 - 8.1 g/dL - 3.8(L) -  Total Bilirubin 0.3 - 1.2 mg/dL - 0.8 -  Alkaline Phos 38 - 126 U/L - 33(L) -  AST 15 - 41 U/L - 24 -  ALT 0 - 44 U/L - 25 -    Imaging studies: No new pertinent imaging studies   Assessment/Plan:  70 y.o. male 1 Day Post-Op s/p exploratory laparotomy, duodenotomy and ligation of gastroduodenal and pancreaticoduodenal vessels, Heineke-Mikulicz Pyloroplasty, and placement of feeding jejunostomy for massive bleed from duodenal ulcer with hemorrhagic shock.    - Greatly appreciate vascular surgery help  - Continue NPO; likely until Monday (06/27); he will need UGI study prior to consideration of NGT removal; anticipate Monday (06/27)  - Continue NGT to LIS; monitor and record output   - Okay to continue enteric feeding via jejunostomy; monitor bowel function; advance slowly - Continue IVF resuscitation - Continue IV PPI - Continue IV Abx (Zosyn) - Maintain surgical drain; monitor and record output - Mine wound care: wet-to-dry dressing changes daily +  PRN - Monitor leukocytosis - Monitor H&H; transfuse as needed  - Ventilator and vasopressor support per PCCM    All of the above findings and recommendations were discussed with the medical team.   -- Edison Simon, PA-C [facility] 05/29/2021, 7:23 AM [phone] M-F: 7am - 4pm

## 2021-05-30 DIAGNOSIS — D500 Iron deficiency anemia secondary to blood loss (chronic): Secondary | ICD-10-CM

## 2021-05-30 DIAGNOSIS — K631 Perforation of intestine (nontraumatic): Secondary | ICD-10-CM

## 2021-05-30 DIAGNOSIS — J95821 Acute postprocedural respiratory failure: Secondary | ICD-10-CM

## 2021-05-30 LAB — CBC
HCT: 20.7 % — ABNORMAL LOW (ref 39.0–52.0)
Hemoglobin: 7.1 g/dL — ABNORMAL LOW (ref 13.0–17.0)
MCH: 32.3 pg (ref 26.0–34.0)
MCHC: 34.3 g/dL (ref 30.0–36.0)
MCV: 94.1 fL (ref 80.0–100.0)
Platelets: 192 10*3/uL (ref 150–400)
RBC: 2.2 MIL/uL — ABNORMAL LOW (ref 4.22–5.81)
RDW: 16.2 % — ABNORMAL HIGH (ref 11.5–15.5)
WBC: 17.7 10*3/uL — ABNORMAL HIGH (ref 4.0–10.5)
nRBC: 0.2 % (ref 0.0–0.2)

## 2021-05-30 LAB — BASIC METABOLIC PANEL
Anion gap: 1 — ABNORMAL LOW (ref 5–15)
BUN: 28 mg/dL — ABNORMAL HIGH (ref 8–23)
CO2: 28 mmol/L (ref 22–32)
Calcium: 7.8 mg/dL — ABNORMAL LOW (ref 8.9–10.3)
Chloride: 118 mmol/L — ABNORMAL HIGH (ref 98–111)
Creatinine, Ser: 0.68 mg/dL (ref 0.61–1.24)
GFR, Estimated: >60 mL/min (ref >60)
Glucose, Bld: 146 mg/dL — ABNORMAL HIGH (ref 70–99)
Potassium: 3.4 mmol/L — ABNORMAL LOW (ref 3.5–5.1)
Sodium: 147 mmol/L — ABNORMAL HIGH (ref 135–145)

## 2021-05-30 LAB — HEMOGLOBIN AND HEMATOCRIT, BLOOD
HCT: 23.9 % — ABNORMAL LOW (ref 39.0–52.0)
HCT: 21.7 % — ABNORMAL LOW (ref 39.0–52.0)
HCT: 26 % — ABNORMAL LOW (ref 39.0–52.0)
Hemoglobin: 8 g/dL — ABNORMAL LOW (ref 13.0–17.0)
Hemoglobin: 7.2 g/dL — ABNORMAL LOW (ref 13.0–17.0)
Hemoglobin: 8.8 g/dL — ABNORMAL LOW (ref 13.0–17.0)

## 2021-05-30 LAB — GLUCOSE, CAPILLARY
Glucose-Capillary: 114 mg/dL — ABNORMAL HIGH (ref 70–99)
Glucose-Capillary: 119 mg/dL — ABNORMAL HIGH (ref 70–99)
Glucose-Capillary: 108 mg/dL — ABNORMAL HIGH (ref 70–99)
Glucose-Capillary: 128 mg/dL — ABNORMAL HIGH (ref 70–99)
Glucose-Capillary: 115 mg/dL — ABNORMAL HIGH (ref 70–99)
Glucose-Capillary: 105 mg/dL — ABNORMAL HIGH (ref 70–99)

## 2021-05-30 LAB — MAGNESIUM: Magnesium: 1.9 mg/dL (ref 1.7–2.4)

## 2021-05-30 LAB — PHOSPHORUS: Phosphorus: 1.9 mg/dL — ABNORMAL LOW (ref 2.5–4.6)

## 2021-05-30 MED ORDER — SODIUM CHLORIDE 0.9% IV SOLUTION: Freq: Once | INTRAVENOUS | Status: AC

## 2021-05-30 MED ORDER — FREE WATER
100.0000 mL | Status: DC
  Administered 2021-05-30 – 2021-05-31 (×6): 100 mL

## 2021-05-30 MED ORDER — POTASSIUM PHOSPHATES 15 MMOLE/5ML IV SOLN
20.0000 mmol | Freq: Once | INTRAVENOUS | Status: AC
  Administered 2021-05-30: 20 mmol via INTRAVENOUS
  Filled 2021-05-30: qty 6.67

## 2021-05-30 NOTE — Progress Notes (Signed)
Pt care resumed. Pt remains on doc vent settings. Available labs noted. Plan to eval for further weaning this am.

## 2021-05-30 NOTE — Progress Notes (Addendum)
[place] Hospital Day(s): 7.   Post op day(s): 2 Days Post-Op.   Interval History:  Patient seen and examined Underwent coil embolization of gastroduodenal and left gastric artery (06/23) with Dr Delana Meyer  Issues with tachycardia (~145 bpm, sinus) overnight  His is on 1 m,cg Levophed this morning Patient remains intubated; eyes open; not following commands Most recent Hgb 7.1 this morning (from 8.1) Leukocytosis to 17.7K He has maintained his renal function; sCr - 0.68; UO - 1.0L Mild hypokalemia to 3.4 Hypernatremia to 147 Hypophosphatemia to 1.9 NGT with 1370 ccs out in last 24 hours; maroon colored Surgical drain with 340 ccs; serosanguinous He has also had large maroon colored BM this morning   Vital signs in last 24 hours: [min-max] current  Temp:  [97.1 F (36.2 C)-98.3 F (36.8 C)] 98 F (36.7 C) (06/24 0305) Pulse Rate:  [83-131] 87 (06/24 0700) Resp:  [9-21] 16 (06/24 0700) BP: (90-143)/(51-107) 94/61 (06/24 0700) SpO2:  [89 %-100 %] 100 % (06/24 0700) Arterial Line BP: (73-229)/(48-226) 105/57 (06/23 2130) FiO2 (%):  [30 %] 30 % (06/24 0457)     Height: 5\' 11"  (180.3 cm) Weight: 58.6 kg BMI (Calculated): 18.03   Intake/Output last 2 shifts:  06/23 0701 - 06/24 0700 In: 1725.9 [I.V.:814.9; Blood:320; NG/GT:470; IV Piggyback:121] Out: 2710 [Urine:1000; Emesis/NG output:1370; Drains:340]   Physical Exam:  Constitutional: intubated, eyes open HEENT: NGT in place; output maroon colored  Respiratory: On ventilator Cardiovascular: tachycardic and sinus rhythm Gastrointestinal: Soft, unable to assess tenderness, non-distended. Surgical drain in right abdomen, output serosanguinous, feeding jejunostomy in left abdomen; tube feedings. He did have what appears to be a large maroon colored BM this morning  Genitourinary: Foley in place Integumentary: Midline wound healing via secondary intention, no erythema or drainage  Labs:   CBC Latest Ref Rng & Units 05/30/2021 05/29/2021 05/29/2021  WBC 4.0 - 10.5 K/uL 17.7(H) 21.8(H) -  Hemoglobin 13.0 - 17.0 g/dL 7.1(L) 8.1(L) 7.6(L)  Hematocrit 39.0 - 52.0 % 20.7(L) 23.8(L) 21.9(L)  Platelets 150 - 400 K/uL 192 223 -   CMP Latest Ref Rng & Units 05/30/2021 05/29/2021 05/17/2021  Glucose 70 - 99 mg/dL 146(H) 129(H) 186(H)  BUN 8 - 23 mg/dL 28(H) 20 17  Creatinine 0.61 - 1.24 mg/dL 0.68 0.79 0.89  Sodium 135 - 145 mmol/L 147(H) 143 151(H)  Potassium 3.5 - 5.1 mmol/L 3.4(L) 4.0 3.6  Chloride 98 - 111 mmol/L 118(H) 115(H) 116(H)  CO2 22 - 32 mmol/L 28 25 26   Calcium 8.9 - 10.3 mg/dL 7.8(L) 7.5(L) 7.6(L)  Total Protein 6.5 - 8.1 g/dL - - 3.8(L)  Total Bilirubin 0.3 - 1.2 mg/dL - - 0.8  Alkaline Phos 38 - 126 U/L - - 33(L)  AST 15 - 41 U/L - - 24  ALT 0 - 44 U/L - - 25     Imaging studies: No new pertinent imaging studies   Assessment/Plan:  70 y.o. critically ill male requiring vasopressor and ventilator support 5 Days Post-Op s/p exploratory laparotomy, duodenotomy and ligation of gastroduodenal and pancreaticoduodenal vessels, Heineke-Mikulicz Pyloroplasty, and placement of feeding jejunostomy for massive bleed from duodenal ulcer with hemorrhagic shock.    - Continue NGT to LIS; monitor and record output --> suspect maroon NGT output and stool is residual blood from massive GI bleed, hemodynamics stable this morning on 1 mcg levophed, monitor closely. If he does re-bleed, we may have very limited viable options, he is very sick. Re-intervening surgically would require  resection, open abdomen, prolonged ventilator support, and potentially numerous procedures. Overall prognosis is poor, and despite those aggressive measures, he still may not survive. I did discuss this at bedside with the patient's daughter and she was updated on our thought process. We will repeat Hgb this morning. She would like to wait for his wife to be at bedside, but she is not sure they would want to  subject him to more surgeries.    - Continue NPO; he will need UGI pending clinical condition) study prior to consideration of NGT removal; anticipate Monday 06/29             - Okay to continue enteric feeding via jejunostomy; monitor bowel function; advance slowly - Continue IVF resuscitation - Continue IV PPI - Continue IV Abx (Zosyn) - Maintain surgical drain; monitor and record output - Mine wound care: wet-to-dry dressing changes daily + PRN - Monitor leukocytosis - Monitor H&H; transfuse as needed             - Ventilator and vasopressor support per PCCM     All of the above findings and recommendations were discussed with the medical team.   -- Edison Simon, PA-C [facility] 05/30/2021, 7:31 AM [phone] M-F: 7am - 4pm

## 2021-05-30 NOTE — Progress Notes (Signed)
Patient seen and examined.  I had an extensive discussion with his wife.  He just had another episode of melena on more bloody NG tube output.  His hemoglobin did drop a gram.  He is currently getting 1 unit of red blood cells.. Very difficult situation seems to have a recalcitrant bleeding ulcer.  Cussed with the patient's wife in detail.  Neck step in theory would be to perform another laparotomy damage control and likely stomach resection without anastomosis.  He will eventually require multiple exploratory laparotomies and potential anastomosis.  He is very debilitated.  The wife stated that if he was competent and with that he probably would not want to continue with aggressive surgical invention.  For now she wants to continue medical therapy with transfusions and if the bleeding continues she wants to transition to comfort care if this were to be  a futile circumstance. Intesivist ( Dr. Merrilee Jansky) . In detail. Extensive counseling provided.

## 2021-05-30 NOTE — Progress Notes (Signed)
[place] for Electrolyte Monitoring and Replacement   Recent Labs: Potassium (mmol/L)  Date Value  05/30/2021 3.4 (L)   Magnesium (mg/dL)  Date Value  05/30/2021 1.9   Calcium (mg/dL)  Date Value  05/30/2021 7.8 (L)   Albumin (g/dL)  Date Value  05/22/2021 1.7 (L)   Phosphorus (mg/dL)  Date Value  05/30/2021 1.9 (L)   Sodium (mmol/L)  Date Value  05/30/2021 147 (H)   Corrected Ca: 9.04 mg/dL  Assessment: 70 y.o. 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 Pharmacy is asked to follow electrolytes and replace while in the CCU.  K 4.0>3.4 Phos 3.1>1.9 Mg 1.7>1.9   On free water 14mL q4h, tube feeds 23ml/hr  Goal of Therapy:  Electrolytes WNL  Plan:  Hypophos: replete with IV Kphos 65mmol x1 Also supplies ~62meq of K+ Other Lytes WNL, no further repletion warranted at this time. Recheck electrolytes in AM  Lorna Dibble ,PharmD Clinical Pharmacist 05/30/2021 9:55 AM

## 2021-05-30 NOTE — Progress Notes (Signed)
NAME:  Timothy Shaffer, MRN:  [id number], DOB:  Apr 17, 1951, LOS: 7 ADMISSION DATE:  05/21/2021, CONSULTATION DATE:  05/26/21 REFERRING MD:  Marlowe Sax MD , CHIEF COMPLAINT:   GI bleed  History of Present Illness:  70 yo M with PMH of AAA, mod persistent-asthma, ADP, PUD, cecal mass, GIB, GERD, pneumothorax s/p chest tube, hx of ex-lap with PUD and perforation in the past, protein cal malnutrition, left renal mass who came in with UGIB s/p GI evalation with EGD/c-Scope requiring clipping intervention of bleeding ulcer with initial response to stable vital signs. Overnight on medica floor patient with clinical deterioration and instability of vital and significant hematochezia leading to unresponsiveness with circulatory shock. Patient was brought in to MICU emergently intubated placed on MV with requirement of pRBC transfusion and norepinephrine infusion as well as central vascular access for rapid ressussitation  Pertinent  Medical History    has a past medical history of AAA (abdominal aortic aneurysm) ([place]), Asthma, Complication of anesthesia, Family history of adverse reaction to anesthesia, Perforated duodenal ulcer ([place]) (05/21/1992), PONV (postoperative nausea and vomiting), and PUD (peptic ulcer disease).   Significant Hospital Events: Including procedures, antibiotic start and stop dates in addition to other pertinent events   6/18 upper endoscopy with clipping 6/19 exploratory laparotomy  6/20 remains on Levophed, started on feeds through jejunostomy tube 6/22 had repeat episode of bleeding.  Underwent embolization by vascular surgery  Interim History / Subjective:   Remains on norepinephrine drip, hemoglobin is stable  Objective   Blood pressure (!) 101/53, pulse (!) 110, temperature 98 F (36.7 C), temperature source Axillary, resp. rate 13, height 5\' 11"  (1.803 m), weight 58.6 kg, SpO2 100 %. CVP:  [4 mmHg-12 mmHg] 12 mmHg  Vent Mode: PRVC FiO2 (%):  [30 %] 30 % Set Rate:  [16  bmp] 16 bmp Vt Set:  [450 mL] 450 mL PEEP:  [5 cmH20] 5 cmH20   Intake/Output Summary (Last 24 hours) at 05/30/2021 0941 Last data filed at 05/30/2021 0900 Gross per 24 hour  Intake 1829.22 ml  Output 2260 ml  Net -430.78 ml    Filed Weights   05/12/2021 1045 05/19/2021 0444 05/26/21 0600  Weight: 56.7 kg 54 kg 58.6 kg    Examination: Gen:      No acute distress, Frail, malnourished HEENT:  EOMI, sclera anicteric Neck:     No masses; no thyromegaly Lungs:    Clear to auscultation bilaterally; normal respiratory effort CV:         Regular rate and rhythm; no murmurs Abd:      + bowel sounds; soft, non-tender; no palpable masses, no distension Ext:    No edema; adequate peripheral perfusion Skin:      Warm and dry; no rash Neuro:   Sedated  Labs/imaging that I havepersonally reviewed  (right click and "Reselect all SmartList Selections" daily)   BUN/creatinine 20/0.79, WBC 16.9, hemoglobin 8.6, platelets 160  Resolved Hospital Problem list     Assessment & Plan:  Acute post-op hypoxic respiratory failure Remains sedated and intubated at this time Obtain CXR, ABG Wean vent to SBT as tolerated  Hemorrhagic shock secondary to bleeding ulcer Lab Results  Component Value Date   WBC 17.7 (H) 05/30/2021   HGB 8.0 (L) 05/30/2021   HCT 23.9 (L) 05/30/2021   MCV 94.1 05/30/2021   PLT 192 05/30/2021    S/p EGD, exploratory laparotomy PPI q12h N.p.o, tube feeds via GJ tube Intermittent pressor required Follow CBC and transfuse for  hemoglobin less than 7  Renal: Net IO Since Admission: 10,386.58 mL [05/30/21 0947]  Intake/Output Summary (Last 24 hours) at 05/30/2021 0947 Last data filed at 05/30/2021 0900 Gross per 24 hour  Intake 1829.22 ml  Output 2260 ml  Net -430.78 ml   Lab Results  Component Value Date   CREATININE 0.68 05/30/2021   BUN 28 (H) 05/30/2021   NA 147 (H) 05/30/2021   K 3.4 (L) 05/30/2021   CL 118 (H) 05/30/2021   CO2 28 05/30/2021   Patient has  seen urology referral to interventional radiology for biopsy of the left renal mass and percutaneous ablation. Follow-up is currently scheduled on 06/03/2021. Not stable for now to consider further work-up inpatient when stabilized Replete K as required  Best Practice (right click and "Reselect all SmartList Selections" daily)   Diet/type: tubefeeds Pain/Anxiety/Delirium protocol RASS goal -1 VAP protocol (if indicated): Yes DVT prophylaxis: SCD GI prophylaxis: PPI Glucose control:  not indicated Central venous access:  Yes, and it is still needed Arterial line:  Yes, and it is still needed Foley:  Yes, and it is still needed Mobility:  bed rest  PT consulted: Yes Studies pending: None Culture data pending:none Last reviewed culture data:today Antibiotics:zosyn Antibiotic de-escalation: no,  continue current rx Stop date: to be determined  Daily labs: requested Code Status:  full code Last date of multidisciplinary goals of care discussion []  ccm prognosis: Life-threating Disposition: remains critically ill, will stay in intensive care  Critical care time:    The patient is critically ill with multiple organ system failure and requires high complexity decision making for assessment and support, frequent evaluation and titration of therapies, advanced monitoring, review of radiographic studies and interpretation of complex data.   Critical Care Time devoted to patient care services, exclusive of separately billable procedures, described in this note is 35 minutes.   Myrtis Ser. Jahree Dermody,MD  05/30/2021, 9:41 AM

## 2021-05-30 NOTE — Progress Notes (Signed)
Chaplain Maggie made follow up visit with patient, his daughter and then his wife. During the visit the patient was deeply responsive to his wife's voice. He became alert and engaged with her as she assured him of her being there by his side. Chaplain joined the family by offering social support and non-anxious presence. Continued support available as needed per on call chaplain.

## 2021-05-31 LAB — TYPE AND SCREEN
ABO/RH(D): A POS
Antibody Screen: NEGATIVE
Unit division: 0
Unit division: 0
Unit division: 0
Unit division: 0

## 2021-05-31 LAB — BPAM RBC
Blood Product Expiration Date: 202206242359
Blood Product Expiration Date: 202207212359
Blood Product Expiration Date: 202207232359
Blood Product Expiration Date: 202207232359
ISSUE DATE / TIME: 202206221143
ISSUE DATE / TIME: 202206221526
ISSUE DATE / TIME: 202206241519
Unit Type and Rh: 6200
Unit Type and Rh: 6200
Unit Type and Rh: 6200
Unit Type and Rh: 9500

## 2021-05-31 LAB — GLUCOSE, CAPILLARY
Glucose-Capillary: 122 mg/dL — ABNORMAL HIGH (ref 70–99)
Glucose-Capillary: 108 mg/dL — ABNORMAL HIGH (ref 70–99)
Glucose-Capillary: 112 mg/dL — ABNORMAL HIGH (ref 70–99)

## 2021-05-31 LAB — BASIC METABOLIC PANEL
Anion gap: 3 — ABNORMAL LOW (ref 5–15)
BUN: 26 mg/dL — ABNORMAL HIGH (ref 8–23)
CO2: 28 mmol/L (ref 22–32)
Calcium: 7.7 mg/dL — ABNORMAL LOW (ref 8.9–10.3)
Chloride: 116 mmol/L — ABNORMAL HIGH (ref 98–111)
Creatinine, Ser: 0.78 mg/dL (ref 0.61–1.24)
GFR, Estimated: >60 mL/min (ref >60)
Glucose, Bld: 143 mg/dL — ABNORMAL HIGH (ref 70–99)
Potassium: 3.3 mmol/L — ABNORMAL LOW (ref 3.5–5.1)
Sodium: 147 mmol/L — ABNORMAL HIGH (ref 135–145)

## 2021-05-31 LAB — PREPARE RBC (CROSSMATCH)

## 2021-05-31 LAB — HEMOGLOBIN AND HEMATOCRIT, BLOOD
HCT: 27.6 % — ABNORMAL LOW (ref 39.0–52.0)
HCT: 26.5 % — ABNORMAL LOW (ref 39.0–52.0)
Hemoglobin: 9.3 g/dL — ABNORMAL LOW (ref 13.0–17.0)
Hemoglobin: 8.8 g/dL — ABNORMAL LOW (ref 13.0–17.0)

## 2021-05-31 LAB — MAGNESIUM: Magnesium: 2 mg/dL (ref 1.7–2.4)

## 2021-05-31 LAB — PHOSPHORUS: Phosphorus: 2.8 mg/dL (ref 2.5–4.6)

## 2021-05-31 MED ORDER — DIAZEPAM 5 MG/ML IJ SOLN
5.0000 mg | INTRAMUSCULAR | Status: DC | PRN
  Administered 2021-05-31: 5 mg via INTRAVENOUS
  Filled 2021-05-31 (×2): qty 2

## 2021-05-31 MED ORDER — MORPHINE 100MG IN NS 100ML (1MG/ML) PREMIX INFUSION
15.0000 mg/h | INTRAVENOUS | Status: DC
  Administered 2021-05-31: 10 mg/h via INTRAVENOUS
  Administered 2021-05-31 – 2021-06-02 (×6): 15 mg/h via INTRAVENOUS
  Filled 2021-05-31 (×7): qty 100

## 2021-05-31 MED ORDER — POTASSIUM CHLORIDE 20 MEQ PO PACK
40.0000 meq | PACK | Freq: Once | ORAL | Status: AC
  Administered 2021-05-31: 40 meq
  Filled 2021-05-31: qty 2

## 2021-05-31 MED ORDER — DIAZEPAM 5 MG/ML IJ SOLN
10.0000 mg | INTRAMUSCULAR | Status: DC | PRN
  Administered 2021-05-31 (×2): 10 mg via INTRAVENOUS
  Filled 2021-05-31 (×3): qty 2

## 2021-05-31 MED ORDER — DIAZEPAM 5 MG/ML IJ SOLN
5.0000 mg | Freq: Once | INTRAMUSCULAR | Status: AC
  Administered 2021-05-31: 5 mg via INTRAVENOUS

## 2021-05-31 MED ORDER — HYDROMORPHONE HCL 1 MG/ML IJ SOLN
1.0000 mg | INTRAMUSCULAR | Status: DC | PRN
  Administered 2021-05-31 (×4): 1 mg via INTRAVENOUS
  Filled 2021-05-31 (×5): qty 1

## 2021-05-31 MED ORDER — DIAZEPAM 5 MG/ML IJ SOLN: 5.0000 mg | Freq: Once | INTRAMUSCULAR | Status: DC

## 2021-05-31 NOTE — Progress Notes (Signed)
Extubated at 1735, family at beside

## 2021-05-31 NOTE — Progress Notes (Signed)
The patient's wife, after discussion with her daughter, wishes for her  husband to be made comfort measures. Compassionate extubation will be completed when family has assembled at the bedside.   No further labs, sticks, studies. Medications for comfort started  //Amyrie Illingworth

## 2021-05-31 NOTE — Progress Notes (Signed)
Patient received lethargic and reaching out in the air with family at beside. RN asked patient he needed something for comfort and patient mouthed yes I do. Valium and dilaudid administered. Continuous morphine infusing. Patient noted with decreased O2 sats at 88%, 2L [place] applied for comfort. Needs anticipated, incontinent of bowel and bladder. Pt safety risks identified, addressed, and maintained to prevent injury. Comfort and hygiene measures provided, and hygiene assistance provided as needed. Personal items within reach. Bed in lowest position. Call light within reach. Family at bedside. Complimentary food cart at room. Patient is now resting (somnolent) and appeared comfortable.Will continue to monitor and endorse

## 2021-05-31 NOTE — Progress Notes (Signed)
Wife met with Dr Merrilee Jansky, and has decided to persue comfort measures. Will initiate morphine drip while waiting on the rest of family to arrive for extubation.

## 2021-05-31 NOTE — Progress Notes (Signed)
  Chaplain On-Call received a call from [employer] with the report that Nurse Joellen Jersey requested Chaplain support for the patient's wife.  Chaplain received medical update from [place], and met Timothy Shaffer, the patient's wife.  [name] described how difficult it is for her to cope with all that has happened for the patient as his condition has become more grave. Chaplain offered supportive listening and conversation.  Timothy Shaffer asked this Chaplain to pray at the bedside for [facility]. Chaplain provided prayer and spiritual and emotional support.  Timothy Shaffer inquired about the next time Chaplain Alroy Dust will be working, stating how much she has been helped by visits from [facility]. This Chaplain informed her that Carman Ching will return on Monday. [name] stated her appreciation to know that information.  Chaplain Pollyann Samples M.Div., [facility]

## 2021-05-31 NOTE — Progress Notes (Signed)
[facility] for Electrolyte Monitoring and Replacement   Recent Labs: Potassium (mmol/L)  Date Value  05/31/2021 3.3 (L)   Magnesium (mg/dL)  Date Value  05/31/2021 2.0   Calcium (mg/dL)  Date Value  05/31/2021 7.7 (L)   Albumin (g/dL)  Date Value  05/12/2021 1.7 (L)   Phosphorus (mg/dL)  Date Value  05/31/2021 2.8   Sodium (mmol/L)  Date Value  05/31/2021 147 (H)    Assessment: 70 y.o. 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 Pharmacy is asked to follow electrolytes and replace while in the CCU.  On free water 62mL q4h, tube feeds 63ml/hr  Goal of Therapy:  Electrolytes WNL  Plan:  Potassium 40 mEq per tube Recheck electrolytes in AM  Tawnya Crook ,PharmD Clinical Pharmacist 05/31/2021 7:24 AM

## 2021-05-31 NOTE — Progress Notes (Signed)
Patient ID: Timothy Shaffer, male   DOB: [birth year], 70 y.o.   MRN: [id number]     [facility] Hospital Day(s): 8.   Post op day(s): 3 Days Post-Op.   Interval History: Patient seen and examined, no acute events or new complaints overnight.  Patient sedated on mechanical ventilation.  Continue critically ill.  No report of any new pain or bleeding.  Vital signs in last 24 hours: [min-max] current  Temp:  [98.5 F (36.9 C)-99.1 F (37.3 C)] 98.5 F (36.9 C) (06/25 0800) Pulse Rate:  [90-128] 104 (06/25 0800) Resp:  [12-22] 16 (06/25 0800) BP: (90-162)/(53-96) 101/62 (06/25 0800) SpO2:  [98 %-100 %] 100 % (06/25 0800) FiO2 (%):  [28 %] 28 % (06/25 0709)     Height: 5\' 11"  (180.3 cm) Weight: 58.6 kg BMI (Calculated): 18.03   Physical Exam:  Constitutional: Critically ill, sedated on mechanical ventilation Respiratory: breathing non-labored on mechanical ventilation Cardiovascular: Tachycardic Gastrointestinal: soft, and non-distended.  Midline wound without erythema, no purulent drainage.  Labs:  CBC Latest Ref Rng & Units 05/31/2021 05/31/2021 05/30/2021  WBC 4.0 - 10.5 K/uL - - -  Hemoglobin 13.0 - 17.0 g/dL 8.8(L) 9.3(L) 8.8(L)  Hematocrit 39.0 - 52.0 % 26.5(L) 27.6(L) 26.0(L)  Platelets 150 - 400 K/uL - - -   CMP Latest Ref Rng & Units 05/31/2021 05/30/2021 05/29/2021  Glucose 70 - 99 mg/dL 143(H) 146(H) 129(H)  BUN 8 - 23 mg/dL 26(H) 28(H) 20  Creatinine 0.61 - 1.24 mg/dL 0.78 0.68 0.79  Sodium 135 - 145 mmol/L 147(H) 147(H) 143  Potassium 3.5 - 5.1 mmol/L 3.3(L) 3.4(L) 4.0  Chloride 98 - 111 mmol/L 116(H) 118(H) 115(H)  CO2 22 - 32 mmol/L 28 28 25   Calcium 8.9 - 10.3 mg/dL 7.7(L) 7.8(L) 7.5(L)  Total Protein 6.5 - 8.1 g/dL - - -  Total Bilirubin 0.3 - 1.2 mg/dL - - -  Alkaline Phos 38 - 126 U/L - - -  AST 15 - 41 U/L - - -  ALT 0 - 44 U/L - - -    Imaging studies: No new pertinent imaging studies   Assessment/Plan:  70 y.o. male with bleeding duodenal  ulcer 3 Days Post-Op s/p duodenostomy and ligation of bleeding vessels, complicated by pertinent comorbidities including recurrent bleeding despite endoscopic, surgical and endovascular interventions, asthma, smoker.  Patient today without sign of recurrent bleeding.  Hemoglobin stable.  NGT without new bright red blood.  Patient on no pressors.  Still critically ill, sedated on mechanical ventilation.  Still with very poor prognosis.  Any subsequent surgical intervention will be extremely high risk.  This was discussed yesterday by primary surgeon with family and they are conscious about the severity of the condition.  I agreed to continue with medical management.  Appreciate critical care team for the management of this difficult case.  We will continue to follow and assist in the management of this case.   Arnold Long, MD

## 2021-06-01 NOTE — Progress Notes (Signed)
Pt observed asleep in bed with family at bedside. Pt respirations shallow. Respiration rate 11. Pt in no apparent distress. Morphine gtt at 15 ml/hr infusing.

## 2021-06-01 NOTE — Progress Notes (Signed)
Continue Comfort Care Measures  No further recs at this time

## 2021-06-01 NOTE — Progress Notes (Signed)
Patient in bed , eyes closed, no signs of distress at this time . Wife at bedside, needs addressed with wife at this time. Pillow given to wife for comfort. No further request/needs made at this time. Timothy Shaffer

## 2021-06-01 NOTE — Plan of Care (Signed)
Continuing comfort care measures.

## 2021-06-01 NOTE — Progress Notes (Signed)
Unused valium syringe returned to pharmacy tech.

## 2021-06-02 DIAGNOSIS — R4589 Other symptoms and signs involving emotional state: Secondary | ICD-10-CM

## 2021-06-02 DIAGNOSIS — Z515 Encounter for palliative care: Secondary | ICD-10-CM

## 2021-06-03 ENCOUNTER — Telehealth: Payer: Medicare HMO

## 2021-06-06 NOTE — Consult Note (Addendum)
Consultation Note Date: 29-Jun-2021   Patient Name: Timothy Shaffer  DOB: 1951/08/04  MRN: [id number]  Age / Sex: 70 y.o., male  PCP: Aura Dials, MD Referring Physician: Alma Friendly, MD  Reason for Consultation: Establishing goals of care, Psychosocial/spiritual support, and Terminal Care  HPI/Patient Profile: Timothy Shaffer is a 70 y.o. 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   Clinical Assessment and Goals of Care: In to see patient per consult for [place], but also to check on symptom management needs. Daughter is at bedside and she is looking at his chest. She states she believes he stopped breathing. Patient is not breathing. She begins to cry because she states she just walked in, and so he was alone during his death. RNs in to confirm death. Daughter states they had just stepped out for a few minutes. Daughter is a person of faith. Discussed God's sovereignty and timing. Discussed that some people do wait to die alone. Discussed his previous status and the dying process. Support offered.  Wife and another family member to bedside. Support offered and questions answered.        SUMMARY OF RECOMMENDATIONS    Patient died.       Primary Diagnoses: Present on Admission:  GI bleeding  PUD (peptic ulcer disease)  Tobacco abuse  Asthma  Hypokalemia  Leukocytosis  Anemia due to blood loss  Moderate alcohol consumption  Left renal mass  Hypomagnesemia  Sepsis ([place])  Acute gastric perforation   I have reviewed the medical record, interviewed the patient and family, and examined the patient. The following aspects are pertinent.  Past Medical History:  Diagnosis Date   AAA  (abdominal aortic aneurysm) (HCC)    Asthma    allergies   Complication of anesthesia    Family history of adverse reaction to anesthesia    PONV   Perforated duodenal ulcer ([place]) 05/21/1992   Emergency surgery at [facility], [place], [place]   PONV (postoperative nausea and vomiting)    PUD (peptic ulcer disease)    Social History   Socioeconomic History   Marital status: Married    Spouse name: Not on file   Number of children: Not on file   Years of education: Not on file   Highest education level: Not on file  Occupational History   Not on file  Tobacco Use   Smoking status: Every Day    Packs/day: 0.50    Years: 15.00    Pack years: 7.50    Types: Cigarettes   Smokeless tobacco: Never  Vaping Use   Vaping Use: Never used  Substance and Sexual Activity   Alcohol use: Yes    Alcohol/week: 12.0 standard drinks    Types: 12 Cans of beer per week   Drug use: Yes    Types: Marijuana   Sexual activity: Not on file  Other Topics Concern  Not on file  Social History Narrative   Not on file   Social Determinants of Health   Financial Resource Strain: Not on file  Food Insecurity: Not on file  Transportation Needs: Not on file  Physical Activity: Not on file  Stress: Not on file  Social Connections: Not on file   Family History  Problem Relation Age of Onset   Gallbladder disease Sister    Breast cancer Daughter    Scheduled Meds:  Chlorhexidine Gluconate Cloth  6 each Topical Daily   mouth rinse  15 mL Mouth Rinse 10 times per day   sodium chloride flush  10-40 mL Intracatheter Q12H   Continuous Infusions:  morphine 15 mg/hr (06-30-21 0859)   PRN Meds:.acetaminophen, diazepam, HYDROmorphone (DILAUDID) injection, ondansetron (ZOFRAN) IV, ondansetron (ZOFRAN) IV, sodium chloride flush Medications Prior to Admission:  Prior to Admission medications   Medication Sig Start Date End Date Taking? Authorizing Provider  albuterol (VENTOLIN HFA) 108 (90 Base) MCG/ACT inhaler  Inhale 1-2 puffs into the lungs every 6 (six) hours as needed for wheezing or shortness of breath.   Yes [provider]  budesonide-formoterol (SYMBICORT) 80-4.5 MCG/ACT inhaler 2 puffs Patient not taking: Reported on 05/13/2021 02/19/21   [provider]   Allergies  Allergen Reactions   Nsaids     History of perforated ulcer   Review of Systems  Unable to perform ROS  Physical Exam Constitutional:      Comments: Pulseless and apneic.     Vital Signs: BP 101/61   Pulse (!) 118   Temp 98.4 F (36.9 C) (Oral)   Resp 18   Ht 5\' 11"  (1.803 m)   Wt 58.6 kg   SpO2 (!) 48% Comment: wife does not want me to put o2 on the patient  BMI 18.02 kg/m  Pain Scale: PAINAD   Pain Score: 0-No pain   SpO2: SpO2: (!) 48 % (wife does not want me to put o2 on the patient) O2 Device:SpO2: (!) 48 % (wife does not want me to put o2 on the patient) O2 Flow Rate: .O2 Flow Rate (L/min): 2 L/min  IO: Intake/output summary:  Intake/Output Summary (Last 24 hours) at 06/30/21 1404 Last data filed at 06-30-2021 1353 Gross per 24 hour  Intake 599.98 ml  Output 975 ml  Net -375.02 ml    LBM: Last BM Date: 05/31/21 Baseline Weight: Weight: 56.7 kg Most recent weight: Weight: 58.6 kg       Time In: 1:35 Time Out: 2:05 Time Total: 30 min Greater than 50%  of this time was spent counseling and coordinating care related to the above assessment and plan.  Signed by: Asencion Gowda, NP   Please contact Palliative Medicine Team phone at [phone] for questions and concerns.  For individual provider: See Shea Evans

## 2021-06-06 NOTE — Death Summary Note (Signed)
DEATH SUMMARY   Patient Details  Name: Timothy Shaffer MRN: [id number] DOB: 02/17/1951  Admission/Discharge Information   Admit Date:  2021-05-27  Date of Death: Date of Death: 2021-06-06  Time of Death: Time of Death: 35  Length of Stay: February 18, 2023  Referring Physician: Aura Dials, MD   Reason(s) for Hospitalization  Upper GI bleed  Diagnoses  Preliminary cause of death:  Hemorrhagic shock secondary to bleeding duodenal ulcer   Secondary Diagnoses (including complications and co-morbidities):  Principal Problem:   GI bleeding Active Problems:   PUD (peptic ulcer disease)   Tobacco abuse   Moderate alcohol consumption   Asthma   Hypokalemia   Leukocytosis   Anemia due to blood loss   Left renal mass   Hypomagnesemia   Sepsis ([place])   Acute gastric perforation   Protein-calorie malnutrition, severe   Brief Hospital Course (including significant findings, care, treatment, and services provided and events leading to death)  Timothy Shaffer is a 70 y.o. year old male with PMH of AAA, mod persistent-asthma, ADP, PUD, cecal mass, GIB, GERD, pneumothorax s/p chest tube, hx of ex-lap with PUD and perforation in the past, protein cal malnutrition, left renal mass who came in with UGIB s/p GI evaluation with EGD/c-Scope requiring clipping intervention of bleeding ulcer on 05/08/2021 with initial response. Subsequently with clinical deterioration with significant hematochezia leading to unresponsiveness with circulatory shock. Patient was brought in to MICU emergently intubated placed on MV with requirement of pRBC transfusion and norepinephrine infusion as well as central vascular access for rapid resuscitation.  On 6/19 underwent exploratory laparotomy, remained on pressors intubated.  On 6/22 had another repeat episode of bleeding, on 6/23 underwent embolization by vascular surgery.  Despite all interventions, patient continued to bleed from NG tube and have large melanotic stools, also  remained on mechanical ventilation.  After extensive discussion with wife and daughter, family agreed to switch to comfort measures and compassionate extubation was done on 05/31/2021.  Patient was placed on morphine drip and comfort measures.  On 2021/06/06 at 13:57, patient expired.    Pertinent Labs and Studies  Significant Diagnostic Studies PERIPHERAL VASCULAR CATHETERIZATION  Result Date: 05/07/2021 See surgical note for result.  DG Chest Port 1 View  Result Date: 05/30/2021 CLINICAL DATA:  Status post intubation and central line placement. EXAM: PORTABLE CHEST 1 VIEW COMPARISON:  07/22/2020. FINDINGS: ET tube mid trachea. Right IJ central venous catheter tip projects over the superior vena cava. Stable cardiac and mediastinal contours. Right basilar heterogeneous opacities. Age-indeterminate posterior right rib fracture. Lung disease excluded view. IMPRESSION: ET tube mid trachea. Right IJ central venous catheter tip projects over the superior vena cava. Lung apices are excluded from view limiting evaluation for pneumothorax. Probable right basilar atelectasis. Electronically Signed   By: Lovey Newcomer M.D.   On: 05/30/2021 10:15   DG UGI W SINGLE CM (SOL OR THIN BA)  Result Date: 05-27-2021 CLINICAL DATA:  Evaluate gastric ulceration with possible perforation. EXAM: WATER SOLUBLE UPPER GI SERIES TECHNIQUE: Single-column upper GI series was performed using water soluble contrast. CONTRAST:  100 mL Omnipaque 300 orally COMPARISON:  CT abdomen 05-27-2021 FLUOROSCOPY TIME:  Fluoroscopy Time:  1 minutes 36 seconds Radiation Exposure Index (if provided by the fluoroscopic device): 35.1 mGy Number of Acquired Spot Images: 0 FINDINGS: Examination of the esophagus demonstrated normal esophageal motility. Normal esophageal morphology without evidence of esophagitis or ulceration. No esophageal stricture, diverticula, or mass lesion. No evidence of hiatal hernia. Mild gastroesophageal reflux. Mucosal  thickening  throughout the stomach as can be seen with gastritis. Focal outpouching of contrast along the superior anterior aspect of distal antrum with adjacent mucosal irregularity most concerning for a contained perforated ulcer. No peritoneal spread of contrast. No gastric outlet obstruction. Duodenum is unremarkable. IMPRESSION: 1. Focal outpouching of contrast along the superior anterior aspect of distal antrum with adjacent mucosal irregularity most concerning for a contained perforated ulcer. No peritoneal spread of contrast. 2. Overall findings concerning for underlying gastritis. Electronically Signed   By: Kathreen Devoid   On: 05/31/2021 16:20   [place] EKG SITE RITE  Result Date: 05/22/2021 If Site Rite image not attached, placement could not be confirmed due to current cardiac rhythm.  CT ANGIO GI BLEED  Result Date: 05/11/2021 CLINICAL DATA:  GI bleed EXAM: CTA ABDOMEN AND PELVIS WITHOUT AND WITH CONTRAST TECHNIQUE: Multidetector CT imaging of the abdomen and pelvis was performed using the standard protocol during bolus administration of intravenous contrast. Multiplanar reconstructed images and MIPs were obtained and reviewed to evaluate the vascular anatomy. CONTRAST:  154mL OMNIPAQUE IOHEXOL 350 MG/ML SOLN COMPARISON:  the previous day's study FINDINGS: VASCULAR Aorta: Moderate calcified atheromatous plaque throughout. 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 No dissection or stenosis. Celiac: Patent without evidence of aneurysm, dissection, vasculitis or significant stenosis. SMA: Partially calcified ostial plaque resulting in 2 cm mild stenosis, patent distally with classic distal branch anatomy. Renals: Single left, heavily calcified proximal without high-grade stenosis. Single right, with irregular partially calcified ostial plaque extending over length of approximately 1.6 cm without high-grade stenosis, patent distally. IMA: Patent without evidence of aneurysm, dissection,  vasculitis or significant stenosis. Inflow: Right common iliac artery fusiform dilatation up to 1.8 cm, left 1.7 cm. Moderate calcified atheromatous plaque throughout the iliac arterial systems bilaterally no dissection or stenosis. Proximal Outflow: Atheromatous, patent Veins: Patent hepatic veins, portal vein, SMV, splenic vein. Retroaortic left renal vein, an anatomic variant. Iliac venous system and IVC unremarkable. No venous pathology evident. Review of the MIP images confirms the above findings. NON-VASCULAR Lower chest: Visualized lung bases clear. No pleural or pericardial effusion. Hepatobiliary: Fatty liver without focal lesion. Gallbladder incompletely distended without calcified stones. Pancreas: Unremarkable. No pancreatic ductal dilatation or surrounding inflammatory changes. Spleen: Normal in size without focal abnormality. Adrenals/Urinary Tract: 3.2 cm enhancing hypervascular mid left renal mass containing coarse calcifications. No urolithiasis or hydronephrosis. Urinary bladder physiologically distended. Stomach/Bowel: Stomach is incompletely distended. There is mucosal hypoenhancement posteriorly in the antrum suggesting ulceration, with process containing small gas bubbles extending adjacent to the GDA suggesting contained perforation. Small bowel is nondistended. Previous right hemicolectomy. Residual oral contrast material in the decompressed left colon with no convincing evidence of active extravasation. Lymphatic: Enlarged mesenteric node adjacent to the gastric process. No retroperitoneal or pelvic adenopathy. Reproductive: Prostate enlargement with central coarse calcifications. Other: Left pelvic phleboliths.  No ascites.  No free air. Musculoskeletal: Narrowing of the L4-5 interspace with circumferential disc bulge/protrusion. No fracture or worrisome bone lesion. IMPRESSION: 1. Negative for active extravasation into the GI tract. 2. Perforated process in the gastric antrum may represent  peptic ulcer disease versus ulcerated neoplasm. This is in proximity to the [place], without pseudoaneurysm or other stigmata of bleeding. Critical Value/emergent results were called by telephone at the time of interpretation on 05/29/2021 at 11:21 am to provider Dr. Dahlia Byes, who verbally acknowledged these results. 3. 3.2 cm left renal mass probably renal cell carcinoma. Urology consult recommended. 4.  Aortic Atherosclerosis (ICD10-170.0). Electronically Signed  By: Lucrezia Europe M.D.   On: 06/05/2021 11:23   CT Angio Abd/Pel W and/or Wo Contrast  Result Date: 05/22/2021 CLINICAL DATA:  70 year old male with a history of GI bleeding EXAM: CTA ABDOMEN AND PELVIS WITHOUT AND WITH CONTRAST TECHNIQUE: Multidetector CT imaging of the abdomen and pelvis was performed using the standard protocol during bolus administration of intravenous contrast. Multiplanar reconstructed images and MIPs were obtained and reviewed to evaluate the vascular anatomy. CONTRAST:  170mL OMNIPAQUE IOHEXOL 350 MG/ML SOLN COMPARISON:  03/12/2021, 07/20/2020 FINDINGS: VASCULAR Aorta: Diameter of the aorta at the hiatus measures 2.6 cm. Mild atherosclerotic changes of the abdominal aorta. No ulcerated plaque or pedunculated plaque. No dissection. Juxtarenal aorta measures 2.3 cm. Largest diameter of the infrarenal abdominal aorta measures 2.4 cm. Celiac: Celiac artery is patent with mild atherosclerotic changes. Stenosis of the left gastric artery origin. SMA: Moderate atherosclerotic changes at the SMA origin including soft plaque which narrows the lumen beyond the origin by estimated 50%. Branches are patent. Renals: - Right: Mild to moderate atherosclerotic changes at the right renal artery origin without evidence of high-grade stenosis. - Left: Mild to moderate atherosclerotic changes of the left renal artery origin without high-grade stenosis. IMA: IMA is patent Right lower extremity: Unremarkable course, caliber, and contour of the right iliac  system. Moderate atherosclerotic changes of the right iliac system without high-grade stenosis or occlusion. No aneurysm, dissection, or occlusion. Hypogastric artery is patent, with atherosclerotic changes. Common femoral artery patent. Proximal SFA and profunda femoris patent. Left lower extremity: Unremarkable course, caliber, and contour of the left iliac system. Moderate atherosclerotic changes of the left iliac system without high-grade stenosis or occlusion. No aneurysm, dissection, or occlusion. Hypogastric artery is patent, with atherosclerotic changes. Common femoral artery patent. Proximal SFA and profunda femoris patent. Veins: Unremarkable appearance of the venous system. Review of the MIP images confirms the above findings. NON-VASCULAR Lower chest: No acute. Hepatobiliary: Diffusely decreased attenuation of liver parenchyma no focal lesions identified. Focal fatty sparing within lower segment 3. Gallbladder is relatively decompressed. Pancreas: Unremarkable. Spleen: Unremarkable. Adrenals/Urinary Tract: - Right adrenal gland: Unremarkable - Left adrenal gland: Unremarkable. - Right kidney: No hydronephrosis, nephrolithiasis, inflammation, or ureteral dilation. No focal lesion. - Left Kidney: No hydronephrosis, nephrolithiasis, inflammation, or ureteral dilation. Interpolar mass of the lateral left renal cortex estimated 2.9 cm, similar to the prior CT and concerning for renal cell carcinoma. - Urinary Bladder: Urinary bladder is relatively decompressed. Stomach/Bowel: - Stomach: Surgical changes along the lesser curvature of the stomach. Inflammatory changes in the region of the gastric antrum and pylorus, with decompression of the stomach lumen. There is gas at the posterior aspect of the stomach antrum, separate from the lumen of the stomach in 3 locations, image 66, image 75, and image 71 of series 5. There are enhancing small nodules within the gastrocolic ligament, image 81 of series 5 and image  33 of series 7. Edema within the associated fat. There is a borderline enlarged lymph node measuring 8 mm-9 mm in the pre duodenal region, new from the comparison CT. - Small bowel: Unremarkable. No accumulation of contrast or air-fluid levels. - Appendix: Appendix is not visualized, however, no inflammatory changes are present adjacent to the cecum to indicate an appendicitis. - Colon: Surgical changes of the left colon. No evidence of obstruction. No focal inflammatory changes. No accumulation of contrast. No air-fluid levels. Lymphatic: No inguinal adenopathy. No pelvic adenopathy. No periaortic adenopathy. Lymph node anterior to the duodenum, 8 mm-9  mm. There are small nodules in the gastrocolic ligament, as described above. Small nodules in the gastrohepatic ligament. Mesenteric: No significant free fluid. Reproductive: Transverse diameter of the prostate 37 mm. Other: No hernia. Musculoskeletal: No evidence of acute fracture. No bony canal narrowing. Degenerative changes of the spine. Degenerative changes of the hips. No aggressive lytic or sclerotic lesions identified. IMPRESSION: CT angiogram is negative for evidence for acute gastrointestinal hemorrhage. Findings of contained perforation at the gastric antrum, concerning for perforated gastric cancer, or alternatively gastric ulcer. There are suspicious small soft tissue implants/nodules within the gastrocolic ligament, gastrohepatic ligament, as well as a borderline pre duodenal lymph node. Surgical evaluation is indicated. These above preliminary results were discussed by telephone at the time of interpretation on 06/03/2021 at 2:40 pm with Dr. Tamala Julian. 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 As previously mentioned, MRI may be useful for further evaluation. Aortic atherosclerosis and mesenteric arterial disease, without evidence of high-grade stenosis. Aortic Atherosclerosis (ICD10-I70.0). Bilateral renal arterial disease without high-grade  stenosis. Additional ancillary findings as above. Signed, Dulcy Fanny. Dellia Nims, RPVI [facility] Radiology Electronically Signed   By: Corrie Mckusick D.O.   On: 05/31/2021 14:49    Microbiology Recent Results (from the past 240 hour(s))  Culture, blood (x 2)     Status: None   Collection Time: 05/27/2021  4:42 PM   Specimen: BLOOD  Result Value Ref Range Status   Specimen Description BLOOD BLOOD RIGHT WRIST  Final   Special Requests   Final    BOTTLES DRAWN AEROBIC AND ANAEROBIC Blood Culture adequate volume   Culture   Final    NO GROWTH 5 DAYS Performed at [facility], [address], [place], [place] [address]    Report Status 05/09/2021 FINAL  Final  Culture, blood (x 2)     Status: None   Collection Time: 06/03/2021  4:53 PM   Specimen: BLOOD  Result Value Ref Range Status   Specimen Description BLOOD BLOOD LEFT HAND  Final   Special Requests   Final    BOTTLES DRAWN AEROBIC AND ANAEROBIC Blood Culture adequate volume   Culture   Final    NO GROWTH 5 DAYS Performed at [facility], [address] [place], [place] [address]    Report Status 05/15/2021 FINAL  Final  MRSA PCR Screening     Status: None   Collection Time: 05/14/2021  2:52 PM  Result Value Ref Range Status   MRSA by PCR NEGATIVE NEGATIVE Final    Comment:        The GeneXpert MRSA Assay (FDA approved for NASAL specimens only), is one component of a comprehensive MRSA colonization surveillance program. It is not intended to diagnose MRSA infection nor to guide or monitor treatment for MRSA infections. Performed at [facility], [address] [place], [place] [address]     Lab Basic Metabolic Panel: Recent Labs  Lab 05/27/21 [phone] 05/17/2021 0452 05/14/2021 0800 05/29/21 0434 05/30/21 0447 05/31/21 0510  NA 144 145 151* 143 147* 147*  K 3.7 3.1* 3.6 4.0 3.4* 3.3*  CL 113* 115* 116* 115* 118* 116*  CO2 27 28 26 25 28 28    GLUCOSE 123* 153* 186* 129* 146* 143*  BUN 18 16 17 20  28* 26*  CREATININE 0.84 0.76 0.89 0.79 0.68 0.78  CALCIUM 7.6* 7.7* 7.6* 7.5* 7.8* 7.7*  MG 2.2 1.8  --  1.7 1.9 2.0  PHOS 3.0 2.6  --  3.1 1.9* 2.8   Liver Function Tests: Recent Labs  Lab 05/16/2021 0800  AST 24  ALT 25  ALKPHOS 33*  BILITOT 0.8  PROT 3.8*  ALBUMIN 1.7*   No results for input(s): LIPASE, AMYLASE in the last 168 hours. No results for input(s): AMMONIA in the last 168 hours. CBC: Recent Labs  Lab 05/30/2021 0830 05/26/2021 2054 05/29/21 0434 05/29/21 1416 05/29/21 2300 05/30/21 0447 05/30/21 0831 05/30/21 1349 05/30/21 1945 05/31/21 0158 05/31/21 0804  WBC 15.8* 16.3* 16.9*  --  21.8* 17.7*  --   --   --   --   --   HGB 6.9* 8.8* 8.6*   < > 8.1* 7.1* 8.0* 7.2* 8.8* 9.3* 8.8*  HCT 20.3* 25.2* 25.0*   < > 23.8* 20.7* 23.9* 21.7* 26.0* 27.6* 26.5*  MCV 93.5 91.3 92.3  --  93.0 94.1  --   --   --   --   --   PLT 165 159 160  --  223 192  --   --   --   --   --    < > = values in this interval not displayed.   Cardiac Enzymes: No results for input(s): CKTOTAL, CKMB, CKMBINDEX, TROPONINI in the last 168 hours. Sepsis Labs: Recent Labs  Lab 05/30/2021 0736 05/14/2021 0830 05/16/2021 2054 05/29/21 0434 05/29/21 2300 05/30/21 0447  WBC  --    < > 16.3* 16.9* 21.8* 17.7*  LATICACIDVEN 1.2  --   --   --   --   --    < > = values in this interval not displayed.    Procedures/Operations  Ex laparotomy   Alma Friendly 2021/06/16, 2:16 PM

## 2021-06-06 NOTE — Care Management Important Message (Signed)
Important Message  Patient Details  Name: Timothy Shaffer MRN: [id number] Date of Birth: 10/05/1951   Medicare Important Message Given:  Other (see comment)  On comfort care measures.  Medicare IM withheld at this time.    Dannette Barbara 06/22/2021, 9:21 AM

## 2021-06-06 NOTE — Progress Notes (Signed)
Patient expired at 1357. Dr Horris Latino and [facility] notified. Family was with the patient. Drains and IV's removed

## 2021-06-06 DEATH — deceased

## 2022-08-26 IMAGING — RF DG UGI W SINGLE CM
14 of 24 series · 14 of 24 positions shown · IV contrast (omnipaque)
Comparison: CT abdomen 05/23/2021

CLINICAL DATA: Evaluate gastric ulceration with possible
perforation.

EXAM:
WATER SOLUBLE UPPER GI SERIES
TECHNIQUE: Single-column upper GI series was performed using water soluble
contrast.
CONTRAST:  100 mL Omnipaque 300 orally

[Series 1: cp_standard · 0.26mm/px · 1 of 1 slices shown (1 of 12)]
[im 1/1]
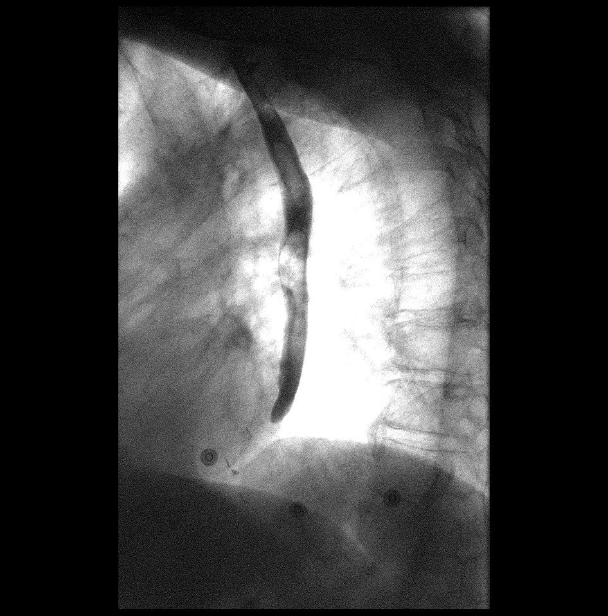

[Series 3: cp_standard · 0.26mm/px · 1 of 1 slices shown (2 of 12)]
[im 1/1]
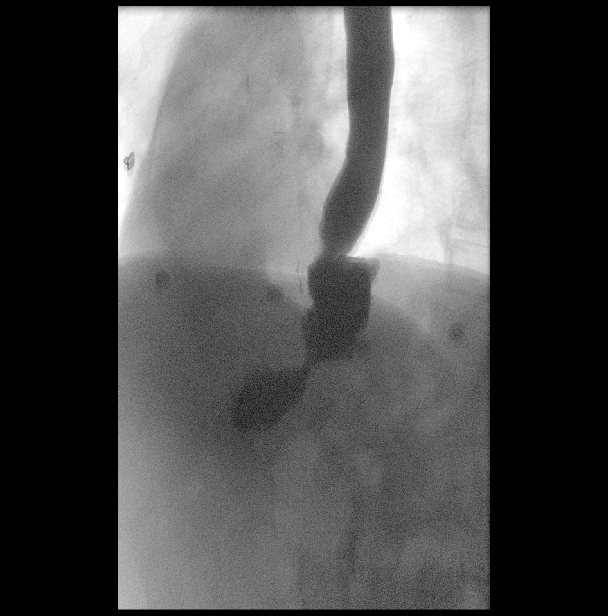

[Series 5: cp_standard · 0.26mm/px · 1 of 1 slices shown (3 of 12)]
[im 1/1]
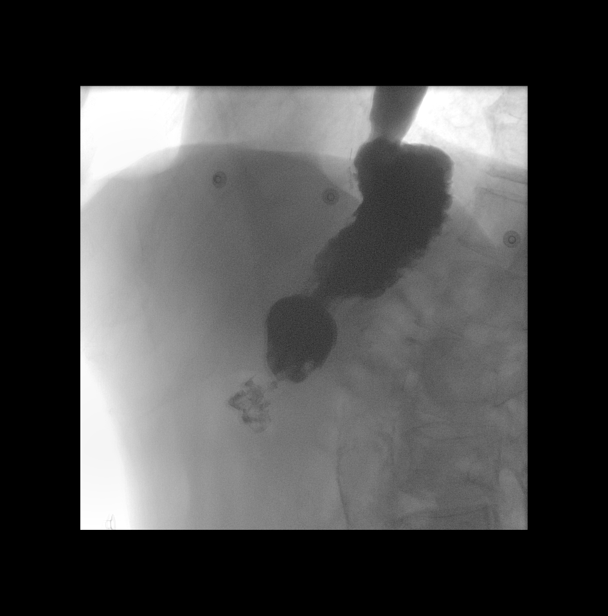

[Series 7: cp_standard · 0.27mm/px · 1 of 1 slices shown (4 of 12)]
[im 1/1]
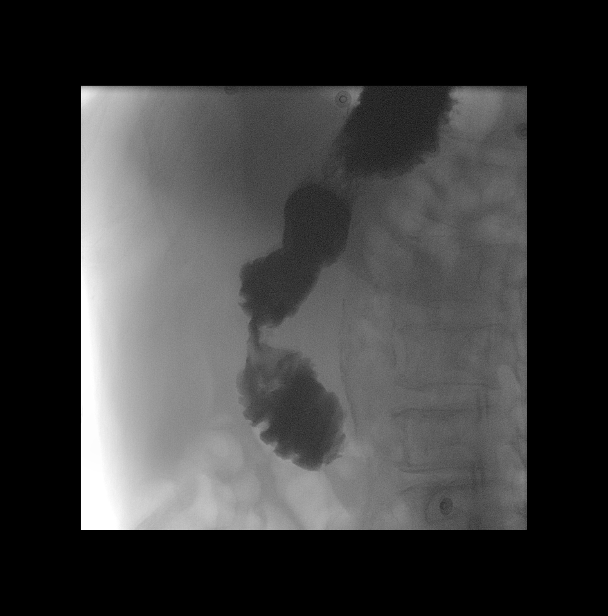

[Series 8: cp_standard · 0.27mm/px · 1 of 1 slices shown (5 of 12)]
[im 1/1]
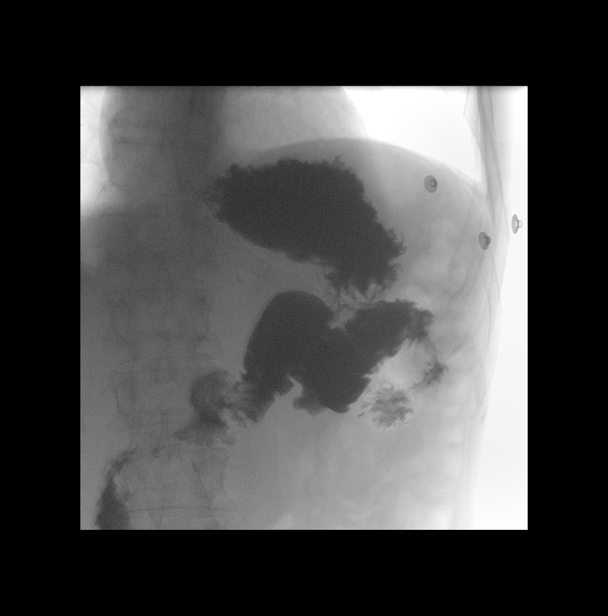

[Series 10: cp_standard · 0.18mm/px · 1 of 1 slices shown (6 of 12)]
[im 1/1]
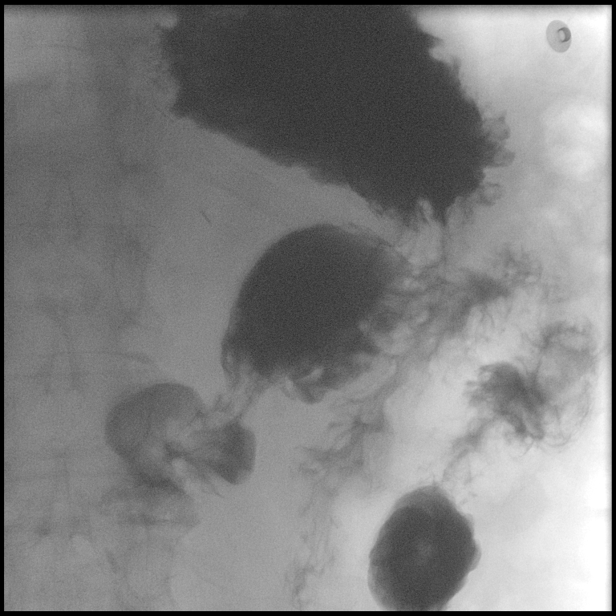

[Series 12: cp_standard · 0.18mm/px · 1 of 1 slices shown (7 of 12)]
[im 1/1]
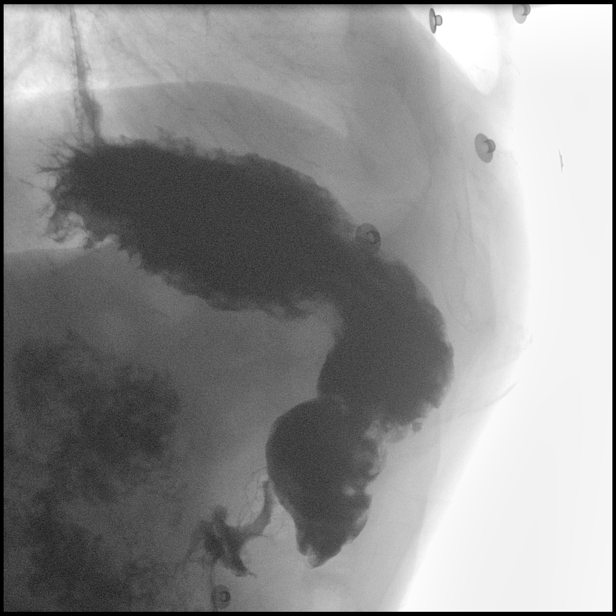

[Series 13: cp_standard · 0.18mm/px · 1 of 1 slices shown (8 of 12)]
[im 1/1]
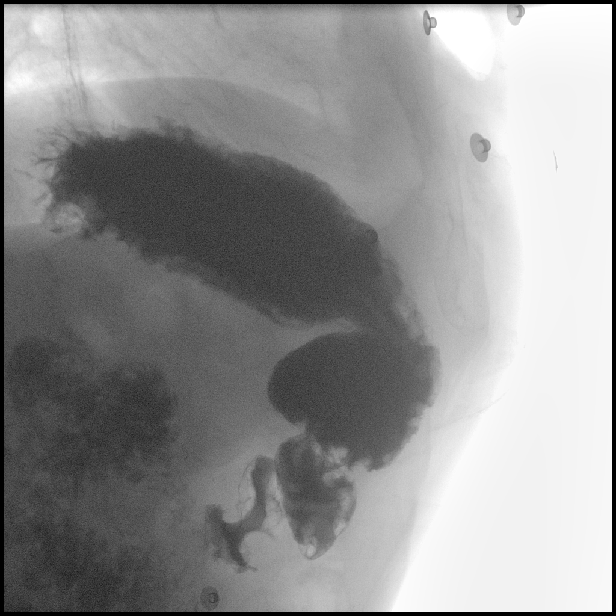

[Series 15: fluoro_iodine_singleshot_bw · 0.18mm/px · 1 of 1 slices shown (1 of 2)]
[im 1/1]
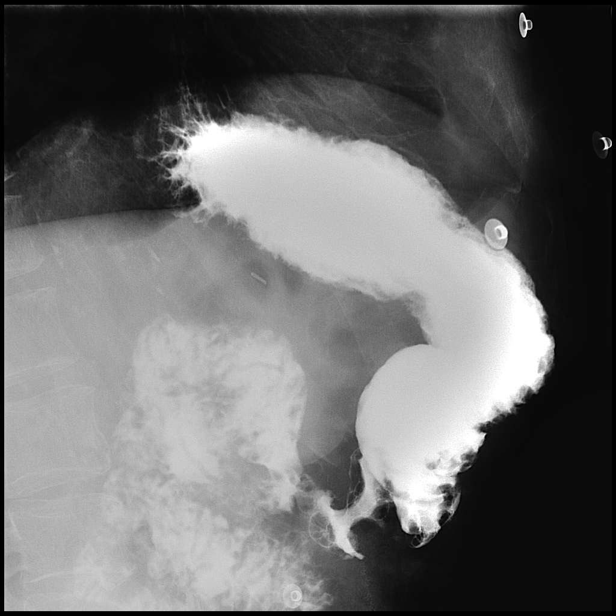

[Series 17: cp_standard · 0.18mm/px · 1 of 1 slices shown (9 of 12)]
[im 1/1]
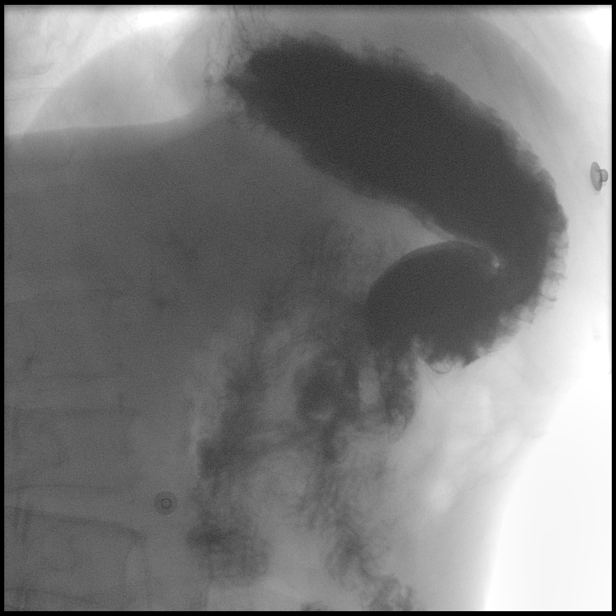

[Series 19: cp_standard · 0.18mm/px · 1 of 1 slices shown (10 of 12)]
[im 1/1]
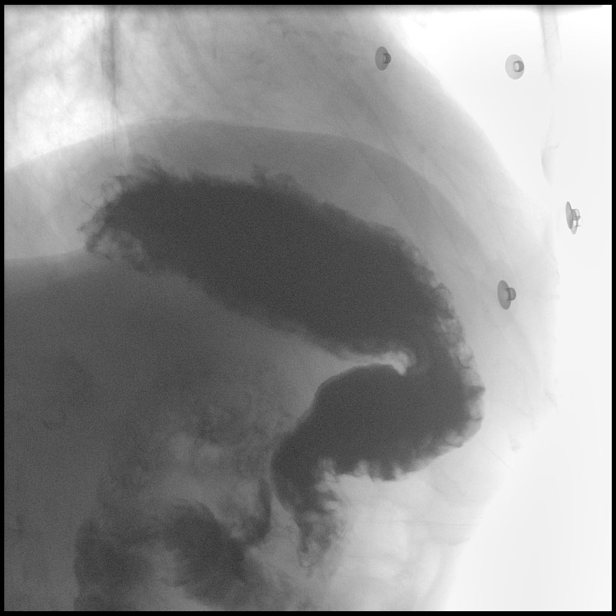

[Series 20: cp_standard · 0.19mm/px · 1 of 1 slices shown (11 of 12)]
[im 1/1]
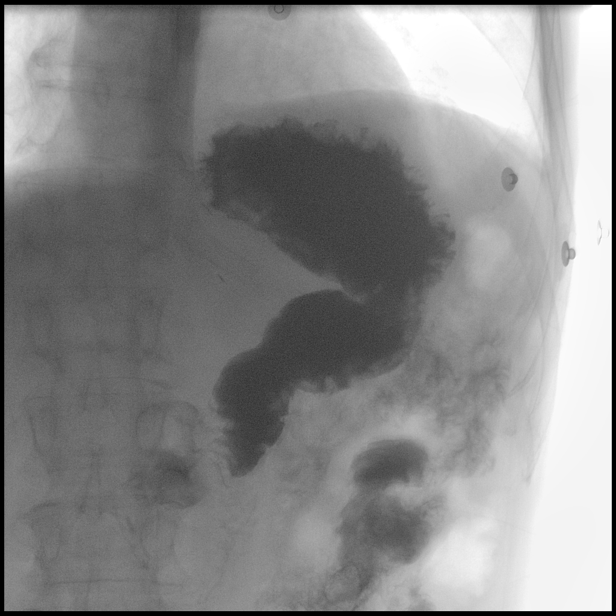

[Series 22: cp_standard · 0.19mm/px · 1 of 1 slices shown (12 of 12)]
[im 1/1]
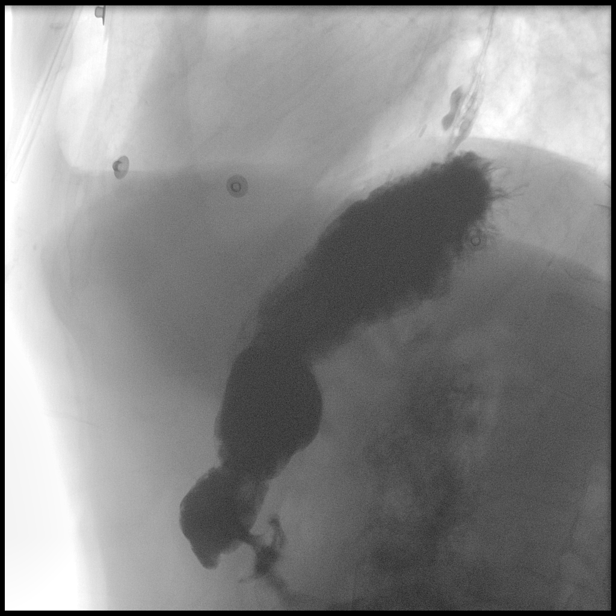

[Series 24: fluoro_iodine_singleshot_bw · 0.19mm/px · 1 of 1 slices shown (2 of 2)]
[im 1/1]
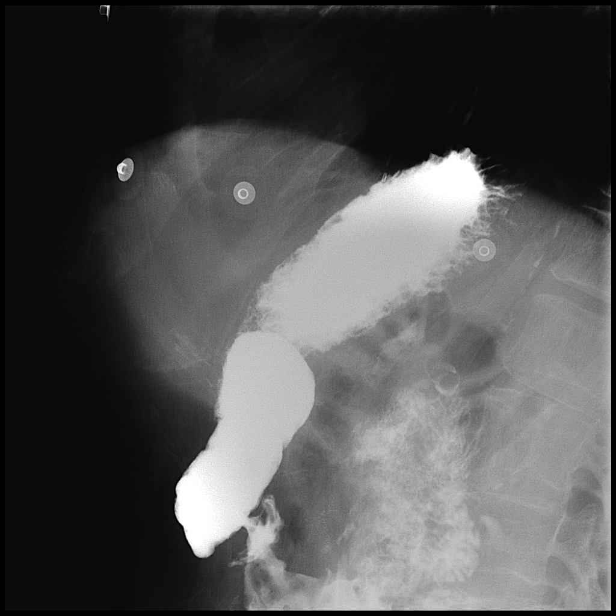

[14 of 24 positions shown; findings below may reference images not displayed]

FLUOROSCOPY TIME:  Fluoroscopy Time:  1 minutes 36 seconds

Radiation Exposure Index (if provided by the fluoroscopic device):
35.1 mGy

Number of Acquired Spot Images: 0
FINDINGS: Examination of the esophagus demonstrated normal esophageal
motility. Normal esophageal morphology without evidence of
esophagitis or ulceration. No esophageal stricture, diverticula, or
mass lesion. No evidence of hiatal hernia. Mild gastroesophageal
reflux.

Mucosal thickening throughout the stomach as can be seen with
gastritis. Focal outpouching of contrast along the superior anterior
aspect of distal antrum with adjacent mucosal irregularity most
concerning for a contained perforated ulcer. No peritoneal spread of
contrast. No gastric outlet obstruction. Duodenum is unremarkable.
IMPRESSION: 1. Focal outpouching of contrast along the superior anterior aspect
of distal antrum with adjacent mucosal irregularity most concerning
for a contained perforated ulcer. No peritoneal spread of contrast.
2. Overall findings concerning for underlying gastritis.
